# Patient Record
Sex: Female | Born: 1960 | Race: Black or African American | Hispanic: No | Marital: Married | Smoking: Never smoker
Health system: Southern US, Community
[De-identification: ages and names within clinical notes are randomized; demographics above are authoritative.]

## PROBLEM LIST (undated history)

## (undated) DIAGNOSIS — R1310 Dysphagia, unspecified: Secondary | ICD-10-CM

## (undated) DIAGNOSIS — Z9109 Other allergy status, other than to drugs and biological substances: Secondary | ICD-10-CM

## (undated) DIAGNOSIS — K219 Gastro-esophageal reflux disease without esophagitis: Secondary | ICD-10-CM

## (undated) DIAGNOSIS — R5383 Other fatigue: Secondary | ICD-10-CM

## (undated) DIAGNOSIS — D649 Anemia, unspecified: Secondary | ICD-10-CM

## (undated) DIAGNOSIS — M549 Dorsalgia, unspecified: Secondary | ICD-10-CM

## (undated) DIAGNOSIS — E739 Lactose intolerance, unspecified: Secondary | ICD-10-CM

## (undated) DIAGNOSIS — M1990 Unspecified osteoarthritis, unspecified site: Secondary | ICD-10-CM

## (undated) DIAGNOSIS — E119 Type 2 diabetes mellitus without complications: Secondary | ICD-10-CM

## (undated) DIAGNOSIS — F419 Anxiety disorder, unspecified: Secondary | ICD-10-CM

## (undated) DIAGNOSIS — Z91018 Allergy to other foods: Secondary | ICD-10-CM

## (undated) DIAGNOSIS — R7303 Prediabetes: Secondary | ICD-10-CM

## (undated) DIAGNOSIS — D126 Benign neoplasm of colon, unspecified: Secondary | ICD-10-CM

## (undated) DIAGNOSIS — M2550 Pain in unspecified joint: Secondary | ICD-10-CM

## (undated) DIAGNOSIS — R41840 Attention and concentration deficit: Secondary | ICD-10-CM

## (undated) DIAGNOSIS — R600 Localized edema: Secondary | ICD-10-CM

## (undated) DIAGNOSIS — E559 Vitamin D deficiency, unspecified: Secondary | ICD-10-CM

## (undated) DIAGNOSIS — R0602 Shortness of breath: Secondary | ICD-10-CM

## (undated) DIAGNOSIS — K589 Irritable bowel syndrome without diarrhea: Secondary | ICD-10-CM

## (undated) DIAGNOSIS — R0600 Dyspnea, unspecified: Secondary | ICD-10-CM

## (undated) DIAGNOSIS — G4730 Sleep apnea, unspecified: Secondary | ICD-10-CM

## (undated) DIAGNOSIS — G5603 Carpal tunnel syndrome, bilateral upper limbs: Secondary | ICD-10-CM

## (undated) HISTORY — DX: Attention and concentration deficit: R41.840

## (undated) HISTORY — DX: Anemia, unspecified: D64.9

## (undated) HISTORY — DX: Gastro-esophageal reflux disease without esophagitis: K21.9

## (undated) HISTORY — DX: Other fatigue: R53.83

## (undated) HISTORY — DX: Sleep apnea, unspecified: G47.30

## (undated) HISTORY — DX: Dysphagia, unspecified: R13.10

## (undated) HISTORY — DX: Dorsalgia, unspecified: M54.9

## (undated) HISTORY — DX: Anxiety disorder, unspecified: F41.9

## (undated) HISTORY — PX: WISDOM TOOTH EXTRACTION: SHX21

## (undated) HISTORY — DX: Unspecified osteoarthritis, unspecified site: M19.90

## (undated) HISTORY — DX: Localized edema: R60.0

## (undated) HISTORY — DX: Allergy to other foods: Z91.018

## (undated) HISTORY — PX: COLONOSCOPY: SHX174

## (undated) HISTORY — PX: NO PAST SURGERIES: SHX2092

## (undated) HISTORY — DX: Type 2 diabetes mellitus without complications: E11.9

## (undated) HISTORY — DX: Lactose intolerance, unspecified: E73.9

## (undated) HISTORY — DX: Other allergy status, other than to drugs and biological substances: Z91.09

## (undated) HISTORY — DX: Dyspnea, unspecified: R06.00

## (undated) HISTORY — DX: Pain in unspecified joint: M25.50

## (undated) HISTORY — DX: Vitamin D deficiency, unspecified: E55.9

## (undated) HISTORY — DX: Benign neoplasm of colon, unspecified: D12.6

## (undated) HISTORY — DX: Prediabetes: R73.03

## (undated) HISTORY — DX: Irritable bowel syndrome, unspecified: K58.9

## (undated) HISTORY — DX: Shortness of breath: R06.02

## (undated) HISTORY — DX: Carpal tunnel syndrome, bilateral upper limbs: G56.03

---

## 1999-02-02 ENCOUNTER — Other Ambulatory Visit: Admission: RE | Admit: 1999-02-02 | Discharge: 1999-02-02 | Payer: Self-pay | Admitting: Obstetrics & Gynecology

## 2001-03-29 ENCOUNTER — Emergency Department (HOSPITAL_COMMUNITY): Admission: EM | Admit: 2001-03-29 | Discharge: 2001-03-29 | Payer: Self-pay | Admitting: Emergency Medicine

## 2001-05-29 ENCOUNTER — Other Ambulatory Visit: Admission: RE | Admit: 2001-05-29 | Discharge: 2001-05-29 | Payer: Self-pay | Admitting: Obstetrics and Gynecology

## 2002-05-08 ENCOUNTER — Other Ambulatory Visit: Admission: RE | Admit: 2002-05-08 | Discharge: 2002-05-08 | Payer: Self-pay | Admitting: Obstetrics and Gynecology

## 2002-05-14 ENCOUNTER — Encounter: Admission: RE | Admit: 2002-05-14 | Discharge: 2002-05-14 | Payer: Self-pay | Admitting: Obstetrics and Gynecology

## 2002-05-14 ENCOUNTER — Encounter: Payer: Self-pay | Admitting: Obstetrics and Gynecology

## 2003-10-12 ENCOUNTER — Emergency Department (HOSPITAL_COMMUNITY): Admission: EM | Admit: 2003-10-12 | Discharge: 2003-10-12 | Payer: Self-pay | Admitting: Emergency Medicine

## 2004-05-06 ENCOUNTER — Encounter: Admission: RE | Admit: 2004-05-06 | Discharge: 2004-05-06 | Payer: Self-pay | Admitting: Obstetrics and Gynecology

## 2004-11-25 ENCOUNTER — Other Ambulatory Visit: Admission: RE | Admit: 2004-11-25 | Discharge: 2004-11-25 | Payer: Self-pay | Admitting: Obstetrics and Gynecology

## 2004-12-06 ENCOUNTER — Ambulatory Visit: Payer: Self-pay | Admitting: Internal Medicine

## 2004-12-07 ENCOUNTER — Ambulatory Visit: Payer: Self-pay | Admitting: Internal Medicine

## 2004-12-10 ENCOUNTER — Ambulatory Visit: Payer: Self-pay | Admitting: Pulmonary Disease

## 2005-02-02 ENCOUNTER — Ambulatory Visit: Payer: Self-pay | Admitting: Internal Medicine

## 2005-09-06 ENCOUNTER — Encounter: Admission: RE | Admit: 2005-09-06 | Discharge: 2005-09-06 | Payer: Self-pay | Admitting: Obstetrics and Gynecology

## 2005-11-09 ENCOUNTER — Ambulatory Visit: Payer: Self-pay | Admitting: Internal Medicine

## 2006-07-13 ENCOUNTER — Ambulatory Visit: Payer: Self-pay | Admitting: Pulmonary Disease

## 2006-07-28 ENCOUNTER — Ambulatory Visit: Payer: Self-pay | Admitting: Pulmonary Disease

## 2006-08-08 ENCOUNTER — Ambulatory Visit: Payer: Self-pay | Admitting: Pulmonary Disease

## 2006-08-08 ENCOUNTER — Ambulatory Visit (HOSPITAL_BASED_OUTPATIENT_CLINIC_OR_DEPARTMENT_OTHER): Admission: RE | Admit: 2006-08-08 | Discharge: 2006-08-08 | Payer: Self-pay | Admitting: Pulmonary Disease

## 2006-09-05 ENCOUNTER — Ambulatory Visit: Payer: Self-pay | Admitting: Internal Medicine

## 2006-09-24 LAB — HM MAMMOGRAPHY

## 2006-10-17 ENCOUNTER — Ambulatory Visit: Payer: Self-pay | Admitting: Internal Medicine

## 2006-11-13 ENCOUNTER — Encounter: Admission: RE | Admit: 2006-11-13 | Discharge: 2006-11-13 | Payer: Self-pay | Admitting: Obstetrics and Gynecology

## 2006-11-27 DIAGNOSIS — J309 Allergic rhinitis, unspecified: Secondary | ICD-10-CM | POA: Insufficient documentation

## 2006-12-19 ENCOUNTER — Ambulatory Visit: Payer: Self-pay | Admitting: Internal Medicine

## 2006-12-19 LAB — CONVERTED CEMR LAB
ALT: 11 units/L (ref 0–35)
Albumin: 3.7 g/dL (ref 3.5–5.2)
Basophils Absolute: 0 10*3/uL (ref 0.0–0.1)
Basophils Relative: 0.2 % (ref 0.0–1.0)
Bilirubin Urine: NEGATIVE
Bilirubin, Direct: 0.1 mg/dL (ref 0.0–0.3)
CO2: 30 meq/L (ref 19–32)
Calcium: 9.2 mg/dL (ref 8.4–10.5)
Creatinine, Ser: 0.9 mg/dL (ref 0.4–1.2)
Eosinophils Absolute: 0.1 10*3/uL (ref 0.0–0.6)
Eosinophils Relative: 1.3 % (ref 0.0–5.0)
GFR calc non Af Amer: 73 mL/min
Glucose, Urine, Semiquant: NEGATIVE
Hemoglobin: 12.9 g/dL (ref 12.0–15.0)
Ketones, urine, test strip: NEGATIVE
Lymphocytes Relative: 32 % (ref 12.0–46.0)
MCHC: 34.7 g/dL (ref 30.0–36.0)
Monocytes Absolute: 0.5 10*3/uL (ref 0.2–0.7)
Sodium: 138 meq/L (ref 135–145)
Total CHOL/HDL Ratio: 2.7
Urobilinogen, UA: 0.2
pH: 7

## 2006-12-26 ENCOUNTER — Ambulatory Visit: Payer: Self-pay | Admitting: Internal Medicine

## 2007-02-09 ENCOUNTER — Ambulatory Visit: Payer: Self-pay | Admitting: Internal Medicine

## 2007-07-24 ENCOUNTER — Ambulatory Visit: Payer: Self-pay | Admitting: Internal Medicine

## 2007-07-24 DIAGNOSIS — N760 Acute vaginitis: Secondary | ICD-10-CM | POA: Insufficient documentation

## 2007-07-24 DIAGNOSIS — R498 Other voice and resonance disorders: Secondary | ICD-10-CM | POA: Insufficient documentation

## 2007-07-27 LAB — CONVERTED CEMR LAB
Basophils Absolute: 0 10*3/uL (ref 0.0–0.1)
Basophils Relative: 0.4 % (ref 0.0–1.0)
Eosinophils Absolute: 0.1 10*3/uL (ref 0.0–0.7)
Eosinophils Relative: 0.8 % (ref 0.0–5.0)
GFR calc non Af Amer: 64 mL/min
HCT: 39.8 % (ref 36.0–46.0)
Hemoglobin: 13.1 g/dL (ref 12.0–15.0)
MCV: 88.2 fL (ref 78.0–100.0)
Monocytes Absolute: 0.6 10*3/uL (ref 0.1–1.0)
Platelets: 304 10*3/uL (ref 150–400)
RBC: 4.51 M/uL (ref 3.87–5.11)
RDW: 12.1 % (ref 11.5–14.6)
Sed Rate: 32 mm/hr — ABNORMAL HIGH (ref 0–22)
Sodium: 137 meq/L (ref 135–145)
WBC: 7.8 10*3/uL (ref 4.5–10.5)

## 2007-08-14 ENCOUNTER — Ambulatory Visit: Payer: Self-pay | Admitting: Internal Medicine

## 2007-08-14 DIAGNOSIS — M542 Cervicalgia: Secondary | ICD-10-CM | POA: Insufficient documentation

## 2007-11-14 ENCOUNTER — Encounter: Admission: RE | Admit: 2007-11-14 | Discharge: 2007-11-14 | Payer: Self-pay | Admitting: Obstetrics and Gynecology

## 2007-11-22 ENCOUNTER — Ambulatory Visit: Payer: Self-pay | Admitting: Internal Medicine

## 2007-11-22 DIAGNOSIS — E049 Nontoxic goiter, unspecified: Secondary | ICD-10-CM | POA: Insufficient documentation

## 2007-11-22 DIAGNOSIS — M549 Dorsalgia, unspecified: Secondary | ICD-10-CM | POA: Insufficient documentation

## 2007-11-26 LAB — CONVERTED CEMR LAB
Free T4: 0.8 ng/dL (ref 0.6–1.6)
TSH: 1.39 microintl units/mL (ref 0.35–5.50)

## 2007-11-28 ENCOUNTER — Encounter: Payer: Self-pay | Admitting: Internal Medicine

## 2008-01-14 ENCOUNTER — Encounter: Payer: Self-pay | Admitting: Internal Medicine

## 2008-01-30 ENCOUNTER — Encounter: Admission: RE | Admit: 2008-01-30 | Discharge: 2008-01-30 | Payer: Self-pay | Admitting: Orthopaedic Surgery

## 2008-05-16 ENCOUNTER — Telehealth: Payer: Self-pay | Admitting: *Deleted

## 2008-05-16 ENCOUNTER — Encounter: Payer: Self-pay | Admitting: Internal Medicine

## 2008-08-06 ENCOUNTER — Ambulatory Visit: Payer: Self-pay | Admitting: Internal Medicine

## 2009-04-25 DIAGNOSIS — R41840 Attention and concentration deficit: Secondary | ICD-10-CM

## 2009-04-25 HISTORY — DX: Attention and concentration deficit: R41.840

## 2010-01-22 ENCOUNTER — Telehealth: Payer: Self-pay | Admitting: *Deleted

## 2010-05-25 NOTE — Progress Notes (Signed)
Summary: cost of health assestment  Phone Note Call from Patient Call back at Work Phone [phone]   Caller: Patient Summary of Call: Pt wants to know the cost of a "Health Assesstment for employement for [employer]." Pt will also need a ppd done as well. She hasn't had a cpx done in awhile. Pt doesn't have any ins. [facility] and will bring the results with her.  So what would like to have done as far as labs and what level would you code it? Initial call taken by: Romualdo Bolk, CMA Duncan Dull),  January 22, 2010 8:46 AM  Follow-up for Phone Call        i dont know what this means   I dont know cost .  Depends on what  is needed.  ? is a form needed?  Follow-up by: Madelin Headings MD,  January 22, 2010 1:56 PM  Additional Follow-up for Phone Call Additional follow up Details #1::        Level 3-4 the cost is 125-178. Per Dr. Fabian Sharp she would charge either a level 3 or 4. We may do labs but she is unsure. If okay probably not. Additional Follow-up by: Romualdo Bolk, CMA Duncan Dull),  January 22, 2010 2:32 PM    Additional Follow-up for Phone Call Additional follow up Details #2::    Pt aware of cost. Follow-up by: Romualdo Bolk, CMA (AAMA),  January 25, 2010 8:31 AM

## 2010-09-10 NOTE — Assessment & Plan Note (Signed)
[facility]                             PULMONARY OFFICE NOTE   DUDLEY, COOLEY                       MRN:          [id number]  DATE:07/13/2006                            DOB:          Feb 02, 1961    I saw Ms. Rizzolo today for further evaluation of her sleep difficulties.   I had last seen her in August of 2006.  At that time, there was concern  that she had sleep maintenance insomnia, and I reviewed with her  techniques in relation to cognitive behavioral therapy.  I was also  quite concerned with her possibly having obstructive sleep apnea.  She  says that she was quite nervous about this diagnosis and as a result did  not follow through with undergoing a sleep study.  Her symptoms  apparently have persisted since then with regards to her sleep  difficulties in particular relation to her possible sleep apnea.  Her  husband says that she snores quite loudly and will see her stop  breathing while she is asleep.  She is currently going to bed at around  10 o'clock at night.  She falls asleep fairly quickly but then wakes up  several times during the night, sometimes with difficulty with her  breathing.  She will get up at 6:30 in the morning and still feel tired.   She also says that her father was diagnosed with asbestos lung disease,  and her mother may have asbestos lung disease as well.  He apparently  had contracted this while working in an aluminum plant.  Ms. 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  She does have occasional dyspnea on exertion but denies any symptoms of  coughing, wheezing, chest pain, or chest tightness.  She has not had any  problems with fevers, chills, sweats, or weight loss.   MEDICATIONS:  She is not currently on any medications.   ALLERGIES:  NO KNOWN DRUG ALLERGIES.   PHYSICAL EXAMINATION:  VITAL SIGNS:   She is 225 pounds.  Temperature  98.5, blood pressure 112/80, heart rate 59, oxygen saturation 98% on  room air.  HEENT:  There was no xanthelasmas.  No nasal discharge.  She has a  Mallampati III airway with an elongated and boggy uvula and 3+ tonsils.  There was no lymphadenopathy.  HEART:  S1 and S2.  CHEST:  No wheezing or rales.  ABDOMEN:  Obese, soft, nontender.  EXTREMITIES:  No edema.   IMPRESSION:  1. Sleep disruption with excess daytime sleepiness.  Again, the      concern that I have is that she likely has some degree of      underlying sleep disorder breathing.  She is agreeable at this time      to undergo an overnight polysomnogram to further evaluate this.  In      the meantime, I have discussed with her the importance of diet,      exercise, and weight reduction as  well as the avoidance of alcohol      and sedatives.  Driving precautions were discussed with her as      well.  2. Possible asbestos exposure.  To further evaluate this as well as to      establish a baseline, I will have her undergo pulmonary function      tests as well as a chest x-ray, and then depending upon if there      are any abnormalities on these, further interventions may be      necessary.   I will follow up with her in approximately four weeks.     Coralyn Helling, MD  Electronically Signed    VS/MedQ  DD: 07/13/2006  DT: 07/13/2006  Job #: [id number]   cc:   Neta Mends. Fabian Sharp, MD

## 2010-09-10 NOTE — Procedures (Signed)
NAME:  Briana Ferguson, Briana Ferguson NO.:  [id number]   MEDICAL RECORD NO.:  [id number]          PATIENT TYPE:  OUT   LOCATION:  SLEEP CENTER                 FACILITY:  [facility]   PHYSICIAN:  Coralyn Helling, MD        DATE OF BIRTH:  December 10, 1960   DATE OF STUDY:  08/08/2006                            NOCTURNAL POLYSOMNOGRAM   REFERRING PHYSICIAN:   FACILITY:  [facility].   INDICATIONS:  This is an individual who has symptoms of excessive  daytime sleepiness with snoring and witnessed apneas.  She is referred  to the sleep lab for evaluation of hypersomnia with obstructive sleep  apnea.   EPWORTH SCORE:  11.   MEDICATIONS:  The patient took a melatonin on the night of the study.   SLEEP ARCHITECTURE:  Total recording time was 467 minutes.  Total sleep  time was 386 minutes.  Sleep efficiency was 88%.  Sleep latency was 11  minutes.  REM latency was 57 minutes, which is slightly reduced.  The  study was notable for the lack of slow-wave sleep.  The patient slept in  both the supine and non-supine position.   RESPIRATORY DATA:  The average respiratory rate was 12.  The overall  apnea/hypopnea index was 5.  The events were exclusively obstructive in  nature.  Mild snoring was noted by the technician.  The supine  apnea/hypopnea index was 8.9.  The non-supine apnea/hypopnea index was  zero.  The REM apnea/hypopnea index was 7.3.  The non-REM apnea/hypopnea  index was 0.6.   OXYGEN DATA:  The baseline oxygenation was 97%.  The oxygen saturation  nadir was 89%.  The patient spent a total of 1.6 minutes with an oxygen  saturation between 81-90%.  The remainder of the test time was spent  with an oxygen saturation greater than 91%.   CARDIAC DATA:  The average heart rate was 52 and the rhythm strip showed  normal sinus rhythm with sinus bradycardia.   MOVEMENT PARASOMNIA:  The periodic limb movement index was 1.7.   IMPRESSION:  This study shows evidence for mild  obstructive sleep apnea  with an apnea/hypopnea index of 5 and an oxygen saturation nadir of 89%.  She did have a significant positional as well as REM effect to her sleep  apnea.  Consideration could be given to having her undergo positional  therapy in addition to diet, exercise, and weight  reduction.  If this unsuccessful, then further intervention may be  warranted such as continuous positive airway pressure therapy, oral  appliance or surgical intervention.      Coralyn Helling, MD  Diplomat, American Board of Sleep Medicine  Electronically Signed     VS/MEDQ  D:  08/22/2006 11:26:10  T:  08/22/2006 12:48:54  Job:  [id number]

## 2010-11-15 ENCOUNTER — Other Ambulatory Visit (INDEPENDENT_AMBULATORY_CARE_PROVIDER_SITE_OTHER): Payer: PRIVATE HEALTH INSURANCE

## 2010-11-15 DIAGNOSIS — Z0000 Encounter for general adult medical examination without abnormal findings: Secondary | ICD-10-CM

## 2010-11-15 LAB — HEPATIC FUNCTION PANEL
Alkaline Phosphatase: 78 U/L (ref 39–117)
Bilirubin, Direct: 0.1 mg/dL (ref 0.0–0.3)
Total Bilirubin: 0.4 mg/dL (ref 0.3–1.2)
Total Protein: 8.3 g/dL (ref 6.0–8.3)

## 2010-11-15 LAB — CBC WITH DIFFERENTIAL/PLATELET
Basophils Absolute: 0 10*3/uL (ref 0.0–0.1)
Eosinophils Absolute: 0 10*3/uL (ref 0.0–0.7)
HCT: 38.7 % (ref 36.0–46.0)
Hemoglobin: 13 g/dL (ref 12.0–15.0)
Lymphs Abs: 1.7 10*3/uL (ref 0.7–4.0)
MCV: 88 fl (ref 78.0–100.0)
Monocytes Relative: 10.9 % (ref 3.0–12.0)
Neutrophils Relative %: 53.2 % (ref 43.0–77.0)
Platelets: 267 10*3/uL (ref 150.0–400.0)
RBC: 4.4 Mil/uL (ref 3.87–5.11)
WBC: 4.8 10*3/uL (ref 4.5–10.5)

## 2010-11-15 LAB — POCT URINALYSIS DIPSTICK
Blood, UA: NEGATIVE
Ketones, UA: NEGATIVE
Leukocytes, UA: NEGATIVE
Nitrite, UA: NEGATIVE
Protein, UA: NEGATIVE
Urobilinogen, UA: 0.2
pH, UA: 8.5

## 2010-11-15 LAB — BASIC METABOLIC PANEL
BUN: 12 mg/dL (ref 6–23)
Creatinine, Ser: 0.9 mg/dL (ref 0.4–1.2)
GFR: 90.7 mL/min (ref >60.00)
Glucose, Bld: 102 mg/dL — ABNORMAL HIGH (ref 70–99)

## 2010-11-15 LAB — TSH: TSH: 1.46 u[IU]/mL (ref 0.35–5.50)

## 2010-11-15 LAB — LIPID PANEL: Total CHOL/HDL Ratio: 2

## 2010-11-17 ENCOUNTER — Encounter: Payer: Self-pay | Admitting: Internal Medicine

## 2010-11-22 ENCOUNTER — Ambulatory Visit (INDEPENDENT_AMBULATORY_CARE_PROVIDER_SITE_OTHER): Payer: PRIVATE HEALTH INSURANCE | Admitting: Internal Medicine

## 2010-11-22 ENCOUNTER — Other Ambulatory Visit (HOSPITAL_COMMUNITY)
Admission: RE | Admit: 2010-11-22 | Discharge: 2010-11-22 | Disposition: A | Discharge status: Home / Self Care | Payer: PRIVATE HEALTH INSURANCE | Source: Ambulatory Visit | Attending: Internal Medicine | Admitting: Internal Medicine

## 2010-11-22 ENCOUNTER — Encounter: Payer: Self-pay | Admitting: Internal Medicine

## 2010-11-22 VITALS — BP 120/80 | HR 60 | Ht 68.25 in | Wt 237.0 lb

## 2010-11-22 DIAGNOSIS — R0609 Other forms of dyspnea: Secondary | ICD-10-CM

## 2010-11-22 DIAGNOSIS — Z01419 Encounter for gynecological examination (general) (routine) without abnormal findings: Secondary | ICD-10-CM | POA: Insufficient documentation

## 2010-11-22 DIAGNOSIS — Z0000 Encounter for general adult medical examination without abnormal findings: Secondary | ICD-10-CM

## 2010-11-22 DIAGNOSIS — R0683 Snoring: Secondary | ICD-10-CM

## 2010-11-22 DIAGNOSIS — R0989 Other specified symptoms and signs involving the circulatory and respiratory systems: Secondary | ICD-10-CM

## 2010-11-22 DIAGNOSIS — G4733 Obstructive sleep apnea (adult) (pediatric): Secondary | ICD-10-CM | POA: Insufficient documentation

## 2010-11-22 NOTE — Progress Notes (Signed)
  Subjective:    Patient ID: Briana Ferguson, female    DOB: 08/01/1960, 50 y.o.   MRN: [id number]  HPI Patient comes in today for Preventive Health Care visit  No longer  Seeing psych .  Getting sleep  And   Not doing meds now.  Was in school.  Period  Is every other month about 5-6 days.   Husband had vasectomy Review of Systems Shoulder   Right  Numb spasm  Positional right arm numb at night .   Snoring sometime severe per husband but no choking remote hx of testing  Planning on trying some type  oral device bought OTC. ROS:  GEN/ HEENTNo fever, significant weight changes sweats headaches vision problems hearing changes, CV/ PULM; No chest pain shortness of breath cough, syncope,edema  change in exercise tolerance. GI /GU: No adominal pain, vomiting, change in bowel habits. No blood in the stool. No significant GU symptoms. SKIN/HEME: ,no acute skin rashes suspicious lesions or bleeding. No lymphadenopathy, nodules, masses.  NEURO/ PSYCH:  No neurologic signs such as weakness numbness No depression anxiety. IMM/ Allergy: No unusual infections.  Allergy .   REST of 12 system review negative  Except as above.   unisom and sleepy time tea.      Objective:   Physical Exam Physical Exam: Vital signs reviewed NWG:NFAO is a well-developed well-nourished alert cooperative  AA  female who appears her stated age in no acute distress.  HEENT: normocephalic  traumatic , Eyes: PERRL EOM's full, conjunctiva clear, Nares: paten,t no deformity discharge or tenderness., Ears: no deformity EAC's clear TMs with normal landmarks. Mouth: clear OP, no lesions, edema.  Moist mucous membranes. Dentition in adequate repair. NECK: supple without masses,  or bruits. thyroid palp no nodules  CHEST/PULM:  Clear to auscultation and percussion breath sounds equal no wheeze , rales or rhonchi. No chest wall deformities or tenderness. Breast: normal by inspection . No dimpling, discharge, masses, tenderness or discharge  .   CV: PMI is nondisplaced, S1 S2 no gallops, murmurs, rubs. Peripheral pulses are full without delay.No JVD .  ABDOMEN: Bowel sounds normal nontender  No guard or rebound, no hepato splenomegal no CVA tenderness.  No hernia. Extremtities:  No clubbing cyanosis or edema, no acute joint swelling or redness no focal atrophy NEURO:  Oriented x3, cranial nerves 3-12 appear to be intact, no obvious focal weakness,gait within normal limits no abnormal reflexes or asymmetrical SKIN: No acute rashes normal turgor, color, no bruising or petechiae. PSYCH: Oriented, good eye contact, no obvious depression anxiety, cognition and judgment appear normal. LN: no cervical axillary inguinal adenopathy Pelvic: NL ext GU, labia clear without lesions or rash . Vagina no lesions .Cervix: clear  UTERUS: Neg CMT Adnexa:  clear no masses . PAP done REctal no masses stool hem neg  EKG:  NSR  Labs reviewed with patient.     Assessment & Plan:  Preventive Health Care Counseled regarding healthy nutrition, exercise, sleep, injury prevention, calcium vit d and healthy weight .   Snoring   Hx of  Neg sleep study a few years ago but husband concerned about her snoring and breathing at night.    rec opinion from ent  For poss upper airway resistance problem  attentional issues better now out of tchool ? Med dint help anyway .  On no meds now.

## 2010-11-22 NOTE — Patient Instructions (Signed)
Continue lifestyle intervention healthy eating and exercise . Weight loss may help snoring. Will contact you about ENT consult  In regard to snoring. Will contact about pap results .

## 2010-11-24 LAB — TB SKIN TEST

## 2010-11-25 ENCOUNTER — Encounter: Payer: Self-pay | Admitting: *Deleted

## 2011-02-19 ENCOUNTER — Inpatient Hospital Stay (INDEPENDENT_AMBULATORY_CARE_PROVIDER_SITE_OTHER)
Admission: RE | Admit: 2011-02-19 | Discharge: 2011-02-19 | Disposition: A | Discharge status: Home / Self Care | Payer: PRIVATE HEALTH INSURANCE | Source: Ambulatory Visit | Attending: Emergency Medicine | Admitting: Emergency Medicine

## 2011-02-19 DIAGNOSIS — H81399 Other peripheral vertigo, unspecified ear: Secondary | ICD-10-CM

## 2011-03-16 ENCOUNTER — Ambulatory Visit (INDEPENDENT_AMBULATORY_CARE_PROVIDER_SITE_OTHER): Payer: PRIVATE HEALTH INSURANCE | Admitting: Internal Medicine

## 2011-03-16 ENCOUNTER — Encounter: Payer: Self-pay | Admitting: Internal Medicine

## 2011-03-16 VITALS — BP 120/80 | HR 60 | Wt 239.0 lb

## 2011-03-16 DIAGNOSIS — J309 Allergic rhinitis, unspecified: Secondary | ICD-10-CM

## 2011-03-16 DIAGNOSIS — R209 Unspecified disturbances of skin sensation: Secondary | ICD-10-CM

## 2011-03-16 DIAGNOSIS — R200 Anesthesia of skin: Secondary | ICD-10-CM

## 2011-03-16 DIAGNOSIS — R42 Dizziness and giddiness: Secondary | ICD-10-CM

## 2011-03-16 DIAGNOSIS — Z9181 History of falling: Secondary | ICD-10-CM

## 2011-03-16 MED ORDER — FLUTICASONE PROPIONATE 50 MCG/ACT NA SUSP: 2.0000 | Freq: Every day | NASAL | Status: DC

## 2011-03-16 NOTE — Progress Notes (Signed)
  Subjective:    Patient ID: Briana Ferguson, female    DOB: 1960-05-12, 50 y.o.   MRN: [id number]  HPI  Patient comes in today for SDA  For multiple problem evaluation. Apparently sen in ed OCT 27 fr dizziness felt to be   From peripheral vertigo  And was congested at the time. Called it lightheadedness  But aggravated. But had to hold onto something. Feel like going backward. Felt like stumbling  And no vision change   Months ago  Larey Seat  down steps .  And caught self  And then later  [facility] on knee .  ? From light headedness. .   And then normal . NO vision change Took antivert night an dramamine  No recent nose spray. Gets congested   Also arm numbness still coming  Often night related but now gets off and on both arms from neck  Down to hands no weakness or fasciculations ? If has occ numbness in leg.   Review of Systems No fever ha vision loss bowel bladder changes   No head trauma or memory changes  Past Medical History  Diagnosis Date  . Allergy   . Attention and concentration deficit     had med trial Dr Tomasa Rand   No past surgical history on file.  reports that she has never smoked. She does not have any smokeless tobacco history on file. She reports that she does not drink alcohol. Her drug history not on file. family history includes Cancer in an unspecified family member; Diabetes in her father; Hyperlipidemia in an unspecified family member; and Stroke in her father. Allergies  Allergen Reactions  . Sulfamethoxazole W/Trimethoprim     REACTION: feels faint       Objective:   Physical Exam WDWN in nad looks well.  HEENT: Normocephalic ;atraumatic , Eyes;  PERRL, EOMs  Full, lids and conjunctiva clear,,Ears: no deformities, canals nl, TM landmarks normal, Nose: no deformity or discharge congested  Mouth : OP clear without lesion or edema . Tongue midline   Chest:  Clear to A&P without wheezes rales or rhonchi CV:  S1-S2 no gallops or murmurs peripheral perfusion is  normal NEURO: oriented x 3 CN 3-12 appear intact. No focal muscle weakness or atrophy. DTRs symmetrical. Gait WNL.  Grossly non focal. No tremor or abnormal movement.  Neg rhomberg but feels unsteady.  Wearing 2 " heels reviewed ED record       Assessment & Plan:   Dizziness presumed vertigo but a bit atypical  Hx of 2 falls  Although not serious injury. Allergic vs chronic rhinitis    Nose some    better recently  Try zyrtec or claritin  In the meantime Numbness mostly UE  Poss from neck  But bilateral at times  No finding obv  Finding on exam today   Because of both orf above hx  Will get neuro consult for opinion    Flu shot today

## 2011-03-16 NOTE — Patient Instructions (Signed)
Will contact  You about neurology consult. For the numbness   Hx and dizziness . Vertigo could  be from  Congestion.  But  I agree we should get a consultation because of the falling episode . In the meantime take the nasal cortisone and antihistamine in case allergy  Congestion is causing some of the symptoms.

## 2011-03-21 ENCOUNTER — Encounter: Payer: Self-pay | Admitting: Neurology

## 2011-04-12 ENCOUNTER — Ambulatory Visit (INDEPENDENT_AMBULATORY_CARE_PROVIDER_SITE_OTHER): Payer: PRIVATE HEALTH INSURANCE | Admitting: Neurology

## 2011-04-12 ENCOUNTER — Encounter: Payer: Self-pay | Admitting: Neurology

## 2011-04-12 VITALS — BP 128/80 | HR 84 | Ht 68.0 in | Wt 241.0 lb

## 2011-04-12 DIAGNOSIS — R200 Anesthesia of skin: Secondary | ICD-10-CM

## 2011-04-12 DIAGNOSIS — R209 Unspecified disturbances of skin sensation: Secondary | ICD-10-CM

## 2011-04-12 NOTE — Progress Notes (Signed)
Dear Dr. Fabian Sharp,  Thank you for having me see Briana Ferguson in consultation today at [facility] for her problem with bilateral arm, hand pain.  As you may recall, she is a 50 y.o. year old female with a benign medical history who presents with complaints of bilateral arm pain. It started 2 years ago.  Started in right hand, then moved to left hand.  Hands felt num, worse at night then the day.  Shaking them out made them better. Months after it started, she felt that if she lifted the arm up made it better.  Also, has right sided neck pain, and if stretched the neck out, then felt release of pain.  Both hands equally involved, middle fingers stung the most.  Not dropping things with hands.  Grip is not as strong.  Now goes up the arm to shoulder, described as spasming, electric shocks.  Relieved if she sleeps on her back.  Used ibuprofen, helped somewhat.   Past Medical History  Diagnosis Date  . Allergy   . Attention and concentration deficit     had med trial Dr Tomasa Rand    No past surgical history on file.  History   Social History  . Marital Status: Married    Spouse Name: N/A    Number of Children: N/A  . Years of Education: N/A   Social History Main Topics  . Smoking status: Never Smoker   . Smokeless tobacco: Never Used  . Alcohol Use: No  . Drug Use: None  . Sexually Active: None   Other Topics Concern  . None   Social History Narrative   [employer] degree  May 2011. 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   4 Pet dog   Has smoke detector and wears seat belts.  No firearms. Stored safely .Sees dentist regularly . No depression CB x 3 Husband with vascectomy    Family History  Problem Relation Age of Onset  . Diabetes Father   . Stroke Father   . Hyperlipidemia      fhx  . Cancer      granfather/liver    Current Outpatient Prescriptions on File Prior to Visit  Medication Sig Dispense Refill  . fluticasone (FLONASE)  50 MCG/ACT nasal spray Place 2 sprays into the nose daily.  16 g  3    Allergies  Allergen Reactions  . Sulfamethoxazole W/Trimethoprim     REACTION: feels faint      ROS:  13 systems were reviewed and are notable for leg pain while walking, and difficulty with balance.  All other review of systems are unremarkable.   Examination:  Filed Vitals:   04/12/11 1429  BP: 128/80  Pulse: 84  Height: 5\' 8"  (1.727 m)  Weight: 241 lb (109.317 kg)     In general, well appearing women.  Extremity: + tinel's sign bilateral ulnar nerves.  ?+Phalen's sign bilaterally.  No tinel's sign at the carpal tunnel.  Cardiovascular: The patient has a regular rate and rhythm and no carotid bruits.  Fundoscopy:  Disks are flat. Vessel caliber within normal limits.  Mental status:   The patient is oriented to person, place and time. Recent and remote memory are intact. Attention span and concentration are normal. Language including repetition, naming, following commands are intact. Fund of knowledge of current and historical events, as well as vocabulary are normal.  Cranial Nerves: Pupils are equally round and reactive to  light. Visual fields full to confrontation. Extraocular movements are intact without nystagmus. Facial sensation and muscles of mastication are intact. Muscles of facial expression are symmetric. Hearing intact to bilateral finger rub. Tongue protrusion, uvula, palate midline.  Shoulder shrug intact  Motor:  The patient has normal bulk and tone, no pronator drift.  There are no adventitious movements. 5/5 bilaterally, normal strength intrinsic muscles of the hand.  Reflexes:   Biceps  Triceps Brachioradialis Knee Ankle  Right 2+  2+  2+   2+ 2+  Left  2+  2+  2+   2+ 2+  Toes down  Coordination:  Normal finger to nose.  No dysdiadokinesia.  Sensation is may be somewhat decreased to pin in the distribution of the left median nerve.  However, inconsistent.  Gait and  Station are normal.  Tandem gait is intact.  Romberg is negative   Impression/Recs: Bilateral ulnar neuropathy vs. median neuropathy vs. radiculopathy.  I am going to get an EMG/NCS of her bilateral UE.  I have asked her to try CTS splinting with the presumptive diagnosis of CTS despite some contradictory findings.  I will call her when we have the results of her NCS/EMG.  We will see the patient back in 6 weeks.  Thank you for having [place] see Briana Ferguson in consultation.  Feel free to contact me with any questions.  Lupita Raider Modesto Charon, MD [facility], [place] [address] [place], [place] [address] Phone: [phone] Fax: [phone].

## 2011-04-12 NOTE — Patient Instructions (Signed)
Your appointment for the nerve conduction studies/electromyelogram is scheduled for Monday, January 14th at 9:45am. at [facility] [address] [place], [place] [phone].

## 2011-05-18 NOTE — Progress Notes (Signed)
EMG/NCS revealed bilateral moderate to severe CTS without signs of radiculopathy or ulnar neuropathy.

## 2011-05-19 ENCOUNTER — Telehealth: Payer: Self-pay | Admitting: Neurology

## 2011-05-19 NOTE — Telephone Encounter (Signed)
Message copied by Benay Spice on Thu May 19, 2011 10:02 AM ------      Message from: Denton Meek H      Created: Wed May 18, 2011 10:59 PM      Regarding: NCS results       Hi Jan,            Could you let [name] know that her EMG did confirm bilateral carpal tunnel syndrome.  If you could find out how her splints are working that would be great.  If she wants we can refer her to Dr. Amanda Pea at [facility] to consult him for carpal tunnel release or steroid injections.            Of course if she has any questions that you can't answer I am happy to call her.            Matt

## 2011-05-19 NOTE — Telephone Encounter (Signed)
Left a message for the patient to call our office (both the home and mobile numbers).

## 2011-05-20 NOTE — Telephone Encounter (Signed)
Left a message for the patient to call.

## 2011-05-23 NOTE — Telephone Encounter (Signed)
Left a message for the patient to call.

## 2011-05-23 NOTE — Telephone Encounter (Signed)
Spoke with the patient. Info given as per Dr. Modesto Charon below. The patient reports that she is having good relief from pain/discomfort with the splints. She does not want to be referred to ortho at this time. I advised the patient to call us for any worsening of her symptoms or if she wanted [place] to make a referral. The patient agreed with this plan. **Dr. Modesto Charon, [place] Childes...........Marland Kitchen

## 2011-05-24 ENCOUNTER — Encounter: Payer: Self-pay | Admitting: Neurology

## 2011-05-25 ENCOUNTER — Ambulatory Visit: Payer: PRIVATE HEALTH INSURANCE | Admitting: Neurology

## 2011-12-21 ENCOUNTER — Other Ambulatory Visit (INDEPENDENT_AMBULATORY_CARE_PROVIDER_SITE_OTHER): Payer: PRIVATE HEALTH INSURANCE

## 2011-12-21 DIAGNOSIS — Z0000 Encounter for general adult medical examination without abnormal findings: Secondary | ICD-10-CM

## 2011-12-21 LAB — BASIC METABOLIC PANEL
BUN: 13 mg/dL (ref 6–23)
CO2: 25 mEq/L (ref 19–32)
Calcium: 8.6 mg/dL (ref 8.4–10.5)
GFR: 86.78 mL/min (ref >60.00)
Potassium: 3.9 mEq/L (ref 3.5–5.1)
Sodium: 136 mEq/L (ref 135–145)

## 2011-12-21 LAB — CBC WITH DIFFERENTIAL/PLATELET
Basophils Absolute: 0 10*3/uL (ref 0.0–0.1)
HCT: 37.7 % (ref 36.0–46.0)
Hemoglobin: 12.3 g/dL (ref 12.0–15.0)
Lymphs Abs: 1.4 10*3/uL (ref 0.7–4.0)
MCHC: 32.5 g/dL (ref 30.0–36.0)
MCV: 88.5 fl (ref 78.0–100.0)
Neutrophils Relative %: 55.3 % (ref 43.0–77.0)
Platelets: 293 10*3/uL (ref 150.0–400.0)

## 2011-12-21 LAB — POCT URINALYSIS DIPSTICK
Bilirubin, UA: NEGATIVE
Ketones, UA: NEGATIVE
Leukocytes, UA: NEGATIVE
Urobilinogen, UA: 0.2
pH, UA: 5.5

## 2011-12-21 LAB — LIPID PANEL
Cholesterol: 145 mg/dL (ref 0–200)
VLDL: 11.4 mg/dL (ref 0.0–40.0)

## 2011-12-21 LAB — HEPATIC FUNCTION PANEL
AST: 15 U/L (ref 0–37)
Alkaline Phosphatase: 66 U/L (ref 39–117)
Total Bilirubin: 0.8 mg/dL (ref 0.3–1.2)

## 2011-12-22 ENCOUNTER — Other Ambulatory Visit: Payer: Self-pay | Admitting: Internal Medicine

## 2011-12-28 ENCOUNTER — Ambulatory Visit (INDEPENDENT_AMBULATORY_CARE_PROVIDER_SITE_OTHER): Payer: PRIVATE HEALTH INSURANCE | Admitting: Internal Medicine

## 2011-12-28 ENCOUNTER — Encounter: Payer: Self-pay | Admitting: Internal Medicine

## 2011-12-28 VITALS — BP 100/70 | HR 94 | Temp 99.0°F | Ht 68.0 in | Wt 241.0 lb

## 2011-12-28 DIAGNOSIS — Z6836 Body mass index (BMI) 36.0-36.9, adult: Secondary | ICD-10-CM

## 2011-12-28 DIAGNOSIS — Z6838 Body mass index (BMI) 38.0-38.9, adult: Secondary | ICD-10-CM | POA: Insufficient documentation

## 2011-12-28 DIAGNOSIS — J309 Allergic rhinitis, unspecified: Secondary | ICD-10-CM

## 2011-12-28 DIAGNOSIS — Z23 Encounter for immunization: Secondary | ICD-10-CM

## 2011-12-28 DIAGNOSIS — Z6837 Body mass index (BMI) 37.0-37.9, adult: Secondary | ICD-10-CM | POA: Insufficient documentation

## 2011-12-28 DIAGNOSIS — G5600 Carpal tunnel syndrome, unspecified upper limb: Secondary | ICD-10-CM | POA: Insufficient documentation

## 2011-12-28 DIAGNOSIS — Z0000 Encounter for general adult medical examination without abnormal findings: Secondary | ICD-10-CM

## 2011-12-28 MED ORDER — FLUTICASONE PROPIONATE 50 MCG/ACT NA SUSP: NASAL | Status: DC

## 2011-12-28 NOTE — Progress Notes (Signed)
Subjective:    Patient ID: Briana Ferguson, female    DOB: Nov 12, 1960, 51 y.o.   MRN: [id number]  HPI  Patient comes in today for Preventive Health Care visit  No major change in health status since last visit . Had work up for CTS mod sever but getting better and declined surgery. Seems ok. Better   When on back no neck pain.  Sleep is better on no meds   Allergic rhinitis   Needs refill flonase.  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 Review of Systems ROS:  GEN/ HEENT: No fever, significant weight changes sweats headaches vision problems hearing changes, CV/ PULM; No chest pain shortness of breath cough, syncope,edema  change in exercise tolerance. GI /GU: No adominal pain, vomiting, change in bowel habits. No blood in the stool. No significant GU menses now irreg  Skipping otherwise normal  symptoms. sometim pulling breast area  Right upper no masses  SKIN/HEME: ,no acute skin rashes suspicious lesions or bleeding. No lymphadenopathy, nodules, masses.  NEURO/ PSYCH:  No neurologic signs such as weakness numbness. No depression anxiety. IMM/ Allergy: No unusual infections.  Allergy .   REST of 12 system review negative except as per HPI  Outpatient Encounter Prescriptions as of 12/28/2011  Medication Sig Dispense Refill  . fluticasone (FLONASE) 50 MCG/ACT nasal spray USE TWO SPRAY IN EACH NOSTRIL EVERY DAY  16 g  0  Past history family history social history reviewed in the electronic medical record.      Objective:   Physical Exam BP 100/70  Pulse 94  Temp 99 F (37.2 C) (Oral)  Ht 5\' 8"  (1.727 m)  Wt 241 lb (109.317 kg)  BMI 36.64 kg/m2  SpO2 98%  Wt Readings from Last 3 Encounters:  12/28/11 241 lb (109.317 kg)  04/12/11 241 lb (109.317 kg)  03/16/11 239 lb (108.41 kg)   Physical Exam: Vital signs reviewed WUJ:WJXB is a well-developed well-nourished alert cooperative  female who appears her stated age in no acute distress.  HEENT: normocephalic  atraumatic , Eyes: PERRL EOM's full, conjunctiva clear, Nares: paten,t no deformity discharge or tenderness., Ears: no deformity EAC's clear TMs with normal landmarks. Mouth: clear OP, no lesions, edema.  Moist mucous membranes. Dentition in adequate repair. NECK: supple without masses, thyromegaly or bruits. CHEST/PULM:  Clear to auscultation and percussion breath sounds equal no wheeze , rales or rhonchi. No chest wall deformities or tenderness. CV: PMI is nondisplaced, S1 S2 no gallops, murmurs, rubs. Peripheral pulses are full without delay.No JVD .  Breast: normal by inspection . No dimpling, discharge, masses, tenderness or discharge . Pendulous breasts  ABDOMEN: Bowel sounds normal nontender  No guard or rebound, no hepato splenomegal no CVA tenderness.  No hernia. Extremtities:  No clubbing cyanosis or edema, no acute joint swelling or redness no focal atrophy NEURO:  Oriented x3, cranial nerves 3-12 appear to be intact, no obvious focal weakness,gait within normal limits no abnormal reflexes or asymmetrical SKIN: No acute rashes normal turgor, color, no bruising or petechiae. PSYCH: Oriented, good eye contact, no obvious depression anxiety, cognition and judgment appear normal. LN: no cervical axillary inguinal adenopathy   Lab Results  Component Value Date   WBC 4.3* 12/21/2011   HGB 12.3 12/21/2011   HCT 37.7 12/21/2011   PLT 293.0 12/21/2011   GLUCOSE 91 12/21/2011   CHOL 145 12/21/2011   TRIG 57.0 12/21/2011   HDL 66.20 12/21/2011   LDLCALC 67 12/21/2011  ALT 13 12/21/2011   AST 15 12/21/2011   NA 136 12/21/2011   K 3.9 12/21/2011   CL 104 12/21/2011   CREATININE 0.9 12/21/2011   BUN 13 12/21/2011   CO2 25 12/21/2011   TSH 1.31 12/21/2011  urine was on cycle at time of ua    Assessment & Plan:  Preventive Health Care Counseled regarding healthy nutrition, exercise, sleep, injury prevention, calcium vit d and healthy weight . Flu vaccine  Perimenopausal with irreg but otherwise nl  periods.  Allergic  Rh refill floonase  CTS  Counseled.

## 2011-12-28 NOTE — Patient Instructions (Addendum)
Optimize work stations to avoid flare of the carpal tunnel syndrome.  Healthy weight loss will help your overall  Health in the future. But your labs are good today. PAPs if normal and no high risk hx can be done every 3 years.  Every 5 years if HPV negative and not having a problem.  Get a mammogram .  continue good sleep hygiene.

## 2012-06-06 ENCOUNTER — Encounter: Payer: Self-pay | Admitting: Internal Medicine

## 2012-06-06 ENCOUNTER — Ambulatory Visit (INDEPENDENT_AMBULATORY_CARE_PROVIDER_SITE_OTHER): Payer: BC Managed Care – PPO | Admitting: Internal Medicine

## 2012-06-06 VITALS — BP 132/80 | HR 85 | Temp 98.3°F | Resp 20 | Wt 247.0 lb

## 2012-06-06 DIAGNOSIS — H6121 Impacted cerumen, right ear: Secondary | ICD-10-CM

## 2012-06-06 DIAGNOSIS — R413 Other amnesia: Secondary | ICD-10-CM

## 2012-06-06 DIAGNOSIS — H6120 Impacted cerumen, unspecified ear: Secondary | ICD-10-CM

## 2012-06-06 NOTE — Progress Notes (Signed)
Chief Complaint  Patient presents with  . Headache  . Memory Loss    HPI: Patient comes in today for SDA for  problem evaluation. Spent.  Here with husband.  Is having continued problems with what she calls short-term memory loss and difficulties  Husband frustrated at times.   Feels" like brain is moving " When lays down. Sensation hearing is okay but sometimes decrease on the right had a history of earwax that we flushed out in the past and she felt better. A few blood vessel burst in high a couple times without any vision change but her blood pressure has been okay when she checked it.  No new medications numbness weakness although she has a history of carpal tunnel. Her memory difficulties she describes as forgetting what she was going to do her conversations that she just had. However mind is quite busy. She has occasional headaches.  No specific events that cause these problems and no balance issues. She is right-handed.  Ocass HAs.  Courting to her husband she's always had short term memory issues about 4/10 but over the last 6 months perhaps 6/10 worsening.  Denies calculation problems or disorientation   Had some problems in school in the early grades with poor grades. At some point she had gone to a neurologist who thought she might have had ADD and was given a stimulant medicine with some questionable affect. Don't believe she ever had full psych for evaluation testing.  ROS: See pertinent positives and negatives per HPI. Hx CTS and had evaluation about a year ago.  Sleep:Unisom.  ocass for sleep and alelrgy with benadryl but not on a reg basis.   Past Medical History  Diagnosis Date  . Allergy   . Attention and concentration deficit     had med trial Dr Tomasa Rand ? dx     Family History  Problem Relation Age of Onset  . Diabetes Father   . Stroke Father   . Hyperlipidemia      fhx  . Cancer      granfather/liver    History   Social History  . Marital Status:  Married    Spouse Name: N/A    Number of Children: N/A  . Years of Education: N/A   Social History Main Topics  . Smoking status: Never Smoker   . Smokeless tobacco: Never Used  . Alcohol Use: No  . Drug Use: None  . Sexually Active: None   Other Topics Concern  . None   Social History Narrative   [employer] degree  May 2011.  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    Married    HH   Of.   6   Pet dog  Outside pet.      Has smoke detector and wears seat belts.  No firearms. Stored safely .Sees dentist regularly . No depression       CB x 3    Husband with vascectomy             Outpatient Encounter Prescriptions as of 06/06/2012  Medication Sig Dispense Refill  . fluticasone (FLONASE) 50 MCG/ACT nasal spray 2 sprays each nostril each day  16 g  12   No facility-administered encounter medications on file as of 06/06/2012.    EXAM:  BP 132/80  Pulse 85  Temp(Src) 98.3 F (36.8 C) (Oral)  Resp 20  Wt 247 lb (112.038 kg)  BMI 37.56 kg/m2  SpO2 97%  Body mass index  is 37.56 kg/(m^2).  GENERAL: vitals reviewed and listed above, alert, oriented, appears well hydrated and in no acute distress  HEENT: atraumatic, conjunctiva  clear, no obvious abnormalities on inspection of external nose and ears right eac with wax tm nl left  OP : no lesion edema or exudate  tongue midline   NECK: no obvious masses on inspection palpation  nobruits   LUNGS: clear to auscultation bilaterally, no wheezes, rales or rhonchi, good air movement  CV: HRRR, no clubbing cyanosis or  peripheral edema nl cap refill  Abdomen:  Sof,t normal bowel sounds without hepatosplenomegaly, no guarding rebound or masses no CVA tenderness NEURO: oriented x 3 CN 3-12 appear intact. No focal muscle weakness or atrophy. DTRs symmetrical. Gait WNL.  Grossly non focal. No tremor or abnormal movement. Heel to to nl neg rhomberg  Nl.  speech and converstaion. MS: moves all extremities without noticeable focal   abnormality  PSYCH: pleasant and cooperative, no obvious depression or anxiety Lab Results  Component Value Date   WBC 4.3* 12/21/2011   HGB 12.3 12/21/2011   HCT 37.7 12/21/2011   PLT 293.0 12/21/2011   GLUCOSE 91 12/21/2011   CHOL 145 12/21/2011   TRIG 57.0 12/21/2011   HDL 66.20 12/21/2011   LDLCALC 67 12/21/2011   ALT 13 12/21/2011   AST 15 12/21/2011   NA 136 12/21/2011   K 3.9 12/21/2011   CL 104 12/21/2011   CREATININE 0.9 12/21/2011   BUN 13 12/21/2011   CO2 25 12/21/2011   TSH 1.31 12/21/2011    ASSESSMENT AND PLAN:  Discussed the following assessment and plan:  1. Memory difficulties   2. Excess wax in ear, right    ongoing problem worsening recently. It is certainly possible she could have ADHD and LD or other process. No focal findings on her exam today. Discussed referral to a neurologist to consider getting an MRI of the brain consider psychoeducational valuation testing to try to figure out how to help. Suggest husband calm to the visit when she sees the neurologist.  In regard to the funny feeling in her head when she lays down it certainly could be the wax in her a year or other will have her come back for this as we are in the midst of the big snowstorm. And office is closing.  -Patient advised to return or notify health care team  if symptoms worsen or persist or new concerns arise.  Patient Instructions  Fortunately no alarming symptoms on your exam today. You do have wax in her right ear which could be adding to some of your head symptoms. Use some earwax softening drops in the right ear and if you're ear symptoms continue over the next week or to come back in for an appointment and we will try to flush out the wax.   Someone will contact you about a neurology consult as we discussed. Contact us in the short run if you're having significant worsening of symptoms or concerns.        Neta Mends. Brayant Dorr M.D.

## 2012-06-06 NOTE — Patient Instructions (Signed)
Fortunately no alarming symptoms on your exam today. You do have wax in her right ear which could be adding to some of your head symptoms. Use some earwax softening drops in the right ear and if you're ear symptoms continue over the next week or to come back in for an appointment and we will try to flush out the wax.   Someone will contact you about a neurology consult as we discussed. Contact us in the short run if you're having significant worsening of symptoms or concerns.

## 2012-07-11 ENCOUNTER — Encounter: Payer: Self-pay | Admitting: Neurology

## 2012-07-11 ENCOUNTER — Ambulatory Visit (INDEPENDENT_AMBULATORY_CARE_PROVIDER_SITE_OTHER): Payer: BC Managed Care – PPO | Admitting: Neurology

## 2012-07-11 VITALS — BP 116/74 | HR 80 | Temp 98.4°F | Resp 16 | Ht 68.0 in | Wt 248.0 lb

## 2012-07-11 DIAGNOSIS — G4730 Sleep apnea, unspecified: Secondary | ICD-10-CM

## 2012-07-11 DIAGNOSIS — R0989 Other specified symptoms and signs involving the circulatory and respiratory systems: Secondary | ICD-10-CM

## 2012-07-11 DIAGNOSIS — G4733 Obstructive sleep apnea (adult) (pediatric): Secondary | ICD-10-CM

## 2012-07-11 DIAGNOSIS — R0683 Snoring: Secondary | ICD-10-CM

## 2012-07-11 DIAGNOSIS — R0609 Other forms of dyspnea: Secondary | ICD-10-CM

## 2012-07-11 NOTE — Progress Notes (Signed)
Briana Ferguson he is a 52 year old woman who is perimenopausal and she has a history of sleep difficulties.  She was tested for sleep apnea about 5 years ago, but at that time her test was apparently negative for obstructive sleep apnea.  Her husband mentions that she does snore and stop breathing at night and it is worse now than it was 5 years ago.  There is also some correlation with weight. At one point she had lost some weight and she states that she was breathing much better.  Both of her parents had sleep apnea.  Her father died of a heart attack. Her mother lost some weight and now she is trying to get off of the CPAP machine.   She typically goes to bed around 10 and wakes up at 445 Sundays at 7:00 other days. 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  She has trouble staying asleep. She commonly wakes up during the night especially between one and 3 but sometimes every hour.  She is asleep for 2 days in a row she'll take Benadryl or Seroquel and it does seem to help.  She prefers not to take Ambien as her father to get and he may have had some side effects.  If she goes a long weekend, she sleeps much better when she is not thinking about the office.  She feels refreshed towards the end of the weekend.  When she is at home, she is up and playing on eye pad and falls asleep in front of the couch, and she gets back up to the bedroom she is wide awake and has trouble falling asleep.  She has noticed for the past few years that she doesn't have as good and memory for details of conversation but she had a few minutes ago or what happened a few days ago.  For example, she remembers being in church on Sunday and what the topic of the sternum was, but she can't remember as much of the details she feels like she should.  Her husband has noticed that she forgets to do some things more so in the past 3-5 years and she had in the past.   Probably her plate is more full as well.  Review of symptoms is positive for pain in the back, knee pain, numbness of the hands and arms at night when she lays on her stomach but can be painful, and she does have a diagnosis of bilateral carpal tunnel syndrome confirmed under conduction studies. She has trouble with weight loss. She feels tired and her body can feel achy.  Past Medical History  Diagnosis Date  . Allergy   . Attention and concentration deficit 2011    had med trial Dr Tomasa Rand ? dx     Current Outpatient Prescriptions on File Prior to Visit  Medication Sig Dispense Refill  . fluticasone (FLONASE) 50 MCG/ACT nasal spray 2 sprays each nostril each day  16 g  12   No current facility-administered medications on file prior to visit.   Sulfamethoxazole w-trimethoprim  Allergies  History   Social History  . Marital Status: Married    Spouse Name: N/A    Number of Children: N/A  . Years of Education: N/A   Occupational History  . Not on file.   Social History Main Topics  . Smoking status: Never Smoker   . Smokeless tobacco: Never Used  .  Alcohol Use: No     Comment: none  . Drug Use: No  . Sexually Active: Not on file   Other Topics Concern  . 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    Married    HH   Of.   6   Pet dog  Outside pet.      Has smoke detector and wears seat belts.  No firearms. Stored safely .Sees dentist regularly . No depression       CB x 3    Husband with vascectomy             Family History  Problem Relation Age of Onset  . Diabetes Father   . Stroke Father   . Hyperlipidemia      fhx  . Cancer      granfather/liver    BP 116/74  Pulse 80  Temp(Src) 98.4 F (36.9 C)  Resp 16  Ht 5\' 8"  (1.727 m)  Wt 248 lb (112.492 kg)  BMI 37.72 kg/m2   Alert and oriented x 3.  Memory function appears to be intact.  Concentration and attention are normal for  educational level and background.  Speech is fluent and without significant word finding difficulty.  Is aware of current events.  No carotid bruits detected.  Cranial nerve II through XII are within normal limits.  This includes normal optic discs and acuity, EOMI, PERLA, facial movement and sensation intact, hearing grossly intact, gag intact,Uvula raises symmetrically and tongue protrudes evenly. Motor strength is 5 over 5 throughout all limbs.  No atrophy, abnormal tone or tremors. Reflexes are 2+ and symmetric in the upper and lower extremities Sensory exam is intact. Coordination is intact for fine movements and rapid alternating movements in all limbs Gait and station are normal.   Impression: 1.  Sleep disturbance with difficulty maintaining sleep.  It seems fairly clear that there is a stress component to this as she sleeps somewhat better when she is away on vacation and when she is at home and working.  In addition there is some suspicion of sleep apnea syndrome and she has observed a falling episode which correlated to a degree with her weight level.  2. Mild short-term memory impairment which I feel is more a symptom of lifestyle and sleep issues that it is a primary disorder.  3.  Overweight, back pain, knee pain, fatigue and muscle aching as well as carpal tunnel syndrome.  It may be time for her to reevaluate her priorities and to make sure she has not had to take care of her sleep and physical needs if she wants optimum health in the future.  Plan: 1.  PSG study to see if there is evidence of obstructive sleep apnea at this time 2. She can continue over-the-counter sleep aids as needed at this time.  She prefers not to have any prescription medications. 3.  Sensible weight loss is advised. 4.  If I could prescribe a vacation or a retreat so that she can reevaluate her priorities and learn to let go of stress better, I would. 5. Return in 6 weeks.

## 2012-07-11 NOTE — Patient Instructions (Addendum)
Your sleep study is scheduled for Thursday, April 10th at 8:00 PM at the [facility]. You will receive information in the mail about this procedure.   [phone]  Follow up with Dr. Smiley Houseman in 6 weeks.

## 2012-08-02 ENCOUNTER — Ambulatory Visit (HOSPITAL_BASED_OUTPATIENT_CLINIC_OR_DEPARTMENT_OTHER): Payer: BC Managed Care – PPO

## 2012-08-29 ENCOUNTER — Ambulatory Visit: Payer: BC Managed Care – PPO | Admitting: Neurology

## 2013-01-09 ENCOUNTER — Other Ambulatory Visit: Payer: Self-pay | Admitting: Obstetrics and Gynecology

## 2013-01-09 DIAGNOSIS — Z1231 Encounter for screening mammogram for malignant neoplasm of breast: Secondary | ICD-10-CM

## 2013-01-30 ENCOUNTER — Ambulatory Visit
Admission: RE | Admit: 2013-01-30 | Discharge: 2013-01-30 | Disposition: A | Discharge status: Home / Self Care | Payer: No Typology Code available for payment source | Source: Ambulatory Visit | Attending: Obstetrics and Gynecology | Admitting: Obstetrics and Gynecology

## 2013-01-30 DIAGNOSIS — Z1231 Encounter for screening mammogram for malignant neoplasm of breast: Secondary | ICD-10-CM

## 2014-01-22 ENCOUNTER — Telehealth: Payer: Self-pay | Admitting: Internal Medicine

## 2014-01-22 NOTE — Telephone Encounter (Signed)
Pt states she is not feeling well, no fever, but just not well. Pt has cough, no sore throat and would like appt tomorrow, thurs. 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  Is it ok to schedule.?

## 2014-01-22 NOTE — Telephone Encounter (Signed)
Pt scheduled  

## 2014-01-22 NOTE — Telephone Encounter (Signed)
Certainly okay to schedule.  If [facility] does not have any available slots than please schedule with Kela Millin, PAC. Thanks!

## 2014-01-22 NOTE — Telephone Encounter (Signed)
lmovm to cb and sch

## 2014-01-23 ENCOUNTER — Ambulatory Visit (INDEPENDENT_AMBULATORY_CARE_PROVIDER_SITE_OTHER): Payer: No Typology Code available for payment source | Admitting: Internal Medicine

## 2014-01-23 ENCOUNTER — Encounter: Payer: Self-pay | Admitting: Internal Medicine

## 2014-01-23 VITALS — BP 130/90 | Temp 98.5°F | Wt 250.0 lb

## 2014-01-23 DIAGNOSIS — B9789 Other viral agents as the cause of diseases classified elsewhere: Secondary | ICD-10-CM

## 2014-01-23 DIAGNOSIS — B349 Viral infection, unspecified: Secondary | ICD-10-CM

## 2014-01-23 DIAGNOSIS — R059 Cough, unspecified: Secondary | ICD-10-CM

## 2014-01-23 DIAGNOSIS — M545 Low back pain, unspecified: Secondary | ICD-10-CM

## 2014-01-23 DIAGNOSIS — J988 Other specified respiratory disorders: Principal | ICD-10-CM

## 2014-01-23 DIAGNOSIS — R05 Cough: Secondary | ICD-10-CM

## 2014-01-23 DIAGNOSIS — R109 Unspecified abdominal pain: Secondary | ICD-10-CM

## 2014-01-23 LAB — POCT URINALYSIS DIPSTICK
BILIRUBIN UA: NEGATIVE
GLUCOSE UA: NEGATIVE
KETONES UA: NEGATIVE
Nitrite, UA: NEGATIVE
Protein, UA: NEGATIVE
RBC UA: NEGATIVE
Spec Grav, UA: 1.015
Urobilinogen, UA: 0.2
pH, UA: 7.5

## 2014-01-23 NOTE — Progress Notes (Signed)
Pre visit review using our clinic review tool, if applicable. No additional management support is needed unless otherwise documented below in the visit note.  Chief Complaint  Patient presents with  . Flank Pain  . Back Pain  . Cough    HPI: Patient Briana Ferguson  comes in today for SDA for  new problem evaluation. 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   Today a bit better   bain a bit better.    Feverish  No shaking chills some hot cold.  No v d .  No unusual rashes.  No uti sx but frequency .   Was a bit worried yesterday cause discomfort was on left side of body  ROS: See pertinent positives and negatives per HPI.  Past Medical History  Diagnosis Date  . Allergy   . Attention and concentration deficit 2011    had med trial Dr Candis Schatz ? dx     Family History  Problem Relation Age of Onset  . Diabetes Father   . Stroke Father   . Hyperlipidemia      fhx  . Cancer      granfather/liver    History   Social History  . Marital Status: Married    Spouse Name: N/A    Number of Children: N/A  . Years of Education: N/A   Social History Main Topics  . Smoking status: Never Smoker   . Smokeless tobacco: Never Used  . Alcohol Use: No     Comment: none  . Drug Use: No  . Sexual Activity: None   Other Topics Concern  . 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    Married    [place]   Of.   6   Pet dog  Outside pet.      Has smoke detector and wears seat belts.  No firearms. Stored safely .Sees dentist regularly . No depression       CB x 3    Husband with vascectomy             Outpatient Encounter Prescriptions as of 01/23/2014  Medication Sig  . fluticasone (FLONASE) 50 MCG/ACT nasal spray 2 sprays each nostril each day  . MAGNESIUM PO Take 2,600 mg by  mouth at bedtime.  Marland Kitchen OVER THE COUNTER MEDICATION LIPOZENE  . Probiotic Product (PROBIOTIC DAILY PO) Take by mouth.    EXAM:  BP 130/90  Temp(Src) 98.5 F (36.9 C) (Oral)  Wt 250 lb (113.399 kg)  Body mass index is 38.02 kg/(m^2).  GENERAL: vitals reviewed and listed above, alert, oriented, appears well hydrated and in no acute distress HEENT: atraumatic, conjunctiva  clear, no obvious abnormalities on inspection of external nose and ears OP : no lesion edema or exudate  NECK: no obvious masses on inspection palpation  LUNGS: clear to auscultation bilaterally, no wheezes, rales or rhonchi, good air movement CV: HRRR, no clubbing cyanosis or  peripheral edema nl cap refill  Abdomen:  Sof,t normal bowel sounds without hepatosplenomegaly, no guarding rebound or masses no CVA tenderness MS: moves all extremities without noticeable focal  abnormality PSYCH: pleasant and cooperative, no obvious depression or anxiety  ASSESSMENT AND PLAN:  Discussed  the following assessment and plan:  Viral respiratory infection - most likely  improved today  exp mana and alarm features to [place]   Bilateral low back pain without sciatica - Plan: POC Urinalysis Dipstick  Flank pain - Plan: POC Urinalysis Dipstick  Cough prob viral early prodrome   Disc bp sleep ( better some  Taking unisom)    Fu if unexpected sx  And plan cpx  Daughter worried not taking care of herself.  -Patient advised to return or notify health care team  if symptoms worsen ,persist or new concerns arise.  Patient Instructions  Suspect early respiratory infection .  Bp   Readings  Want to be below 140/90  Get  Blood pressure monitor  And get readings .   Get CPX appt   .   Standley Brooking. Panosh M.D.  Total visit 109mins > 50% spent counseling and coordinating care

## 2014-01-23 NOTE — Patient Instructions (Addendum)
Suspect early respiratory infection .  Bp   Readings  Want to be below 140/90  Get  Blood pressure monitor  And get readings .   Get CPX appt   .

## 2014-01-28 ENCOUNTER — Ambulatory Visit (INDEPENDENT_AMBULATORY_CARE_PROVIDER_SITE_OTHER): Payer: No Typology Code available for payment source | Admitting: Internal Medicine

## 2014-01-28 ENCOUNTER — Encounter: Payer: Self-pay | Admitting: Internal Medicine

## 2014-01-28 VITALS — BP 136/80 | Temp 98.1°F | Ht 68.0 in | Wt 250.4 lb

## 2014-01-28 DIAGNOSIS — Z0000 Encounter for general adult medical examination without abnormal findings: Secondary | ICD-10-CM | POA: Insufficient documentation

## 2014-01-28 DIAGNOSIS — Z1211 Encounter for screening for malignant neoplasm of colon: Secondary | ICD-10-CM

## 2014-01-28 DIAGNOSIS — R0683 Snoring: Secondary | ICD-10-CM

## 2014-01-28 DIAGNOSIS — Z136 Encounter for screening for cardiovascular disorders: Secondary | ICD-10-CM

## 2014-01-28 DIAGNOSIS — Z23 Encounter for immunization: Secondary | ICD-10-CM

## 2014-01-28 DIAGNOSIS — R030 Elevated blood-pressure reading, without diagnosis of hypertension: Secondary | ICD-10-CM | POA: Insufficient documentation

## 2014-01-28 DIAGNOSIS — G478 Other sleep disorders: Secondary | ICD-10-CM

## 2014-01-28 DIAGNOSIS — G4730 Sleep apnea, unspecified: Secondary | ICD-10-CM

## 2014-01-28 DIAGNOSIS — R5383 Other fatigue: Secondary | ICD-10-CM | POA: Insufficient documentation

## 2014-01-28 LAB — CBC WITH DIFFERENTIAL/PLATELET
Basophils Absolute: 0 10*3/uL (ref 0.0–0.1)
Basophils Relative: 0.4 % (ref 0.0–3.0)
EOS PCT: 2.4 % (ref 0.0–5.0)
Eosinophils Absolute: 0.1 10*3/uL (ref 0.0–0.7)
HEMATOCRIT: 38.3 % (ref 36.0–46.0)
Hemoglobin: 12.8 g/dL (ref 12.0–15.0)
LYMPHS ABS: 1.6 10*3/uL (ref 0.7–4.0)
Lymphocytes Relative: 36 % (ref 12.0–46.0)
MCHC: 33.4 g/dL (ref 30.0–36.0)
MCV: 85.5 fl (ref 78.0–100.0)
Monocytes Absolute: 0.5 10*3/uL (ref 0.1–1.0)
Monocytes Relative: 10.5 % (ref 3.0–12.0)
NEUTROS ABS: 2.3 10*3/uL (ref 1.4–7.7)
Neutrophils Relative %: 50.7 % (ref 43.0–77.0)
Platelets: 296 10*3/uL (ref 150.0–400.0)
RBC: 4.48 Mil/uL (ref 3.87–5.11)
RDW: 13.7 % (ref 11.5–15.5)
WBC: 4.5 10*3/uL (ref 4.0–10.5)

## 2014-01-28 NOTE — Patient Instructions (Addendum)
Will notify you  of labs when available. will refer for colonoscopy.  Concern about significant sleep apnea will we refer for evaluation. Blood pressure again elevated today get a monitor Take blood pressure readings twice a day for 7- 10 days and then periodically .  Marland KitchenSend in readings    Goal below 140/90  Life style  consdier medication  If  Life style not controlling in 3 months . ekg shows some changes that could be  Normal for  You   Will  review the rcord  Consider further evaluation. Skin areas  Not concerning  But follow up if changing.   Healthy lifestyle includes : At least 150 minutes of exercise weeks  , weight at healthy levels, which is usually   BMI 19-25. Avoid trans fats and processed foods;  Increase fresh fruits and veges to 5 servings per day. And avoid sweet beverages including tea and juice. Mediterranean diet with olive oil and nuts have been noted to be heart and brain healthy . Avoid tobacco products . Limit  alcohol to  7 per week for women and 14 servings for men.  Get adequate sleep . Wear seat belts . Don't text and drive .   Managing Your High Blood Pressure Blood pressure is a measurement of how forceful your blood is pressing against the walls of the arteries. Arteries are muscular tubes within the circulatory system. Blood pressure does not stay the same. Blood pressure rises when you are active, excited, or nervous; and it lowers during sleep and relaxation. If the numbers measuring your blood pressure stay above normal most of the time, you are at risk for health problems. High blood pressure (hypertension) is a long-term (chronic) condition in which blood pressure is elevated. A blood pressure reading is recorded as two numbers, such as 120 over 80 (or 120/80). The first, higher number is called the systolic pressure. It is a measure of the pressure in your arteries as the heart beats. The second, lower number is called the diastolic pressure. It is a measure  of the pressure in your arteries as the heart relaxes between beats.  Keeping your blood pressure in a normal range is important to your overall health and prevention of health problems, such as heart disease and stroke. When your blood pressure is uncontrolled, your heart has to work harder than normal. High blood pressure is a very common condition in adults because blood pressure tends to rise with age. Men and women are equally likely to have hypertension but at different times in life. Before age 48, men are more likely to have hypertension. After 51 years of age, women are more likely to have it. Hypertension is especially common in African Americans. This condition often has no signs or symptoms. The cause of the condition is usually not known. Your caregiver can help you come up with a plan to keep your blood pressure in a normal, healthy range. BLOOD PRESSURE STAGES Blood pressure is classified into four stages: normal, prehypertension, stage 1, and stage 2. Your blood pressure reading will be used to determine what type of treatment, if any, is necessary. Appropriate treatment options are tied to these four stages:  Normal  Systolic pressure (mm Hg): below 120.  Diastolic pressure (mm Hg): below 80. Prehypertension  Systolic pressure (mm Hg): 120 to 139.  Diastolic pressure (mm Hg): 80 to 89. Stage1  Systolic pressure (mm Hg): 140 to 159.  Diastolic pressure (mm Hg): 90 to 99. Stage2  Systolic pressure (mm Hg): 160 or above.  Diastolic pressure (mm Hg): 100 or above. RISKS RELATED TO HIGH BLOOD PRESSURE Managing your blood pressure is an important responsibility. Uncontrolled high blood pressure can lead to:  A heart attack.  A stroke.  A weakened blood vessel (aneurysm).  Heart failure.  Kidney damage.  Eye damage.  Metabolic syndrome.  Memory and concentration problems. HOW TO MANAGE YOUR BLOOD PRESSURE Blood pressure can be managed effectively with lifestyle  changes and medicines (if needed). Your caregiver will help you come up with a plan to bring your blood pressure within a normal range. Your plan should include the following: Education  Read all information provided by your caregivers about how to control blood pressure.  Educate yourself on the latest guidelines and treatment recommendations. New research is always being done to further define the risks and treatments for high blood pressure. Lifestylechanges  Control your weight.  Avoid smoking.  Stay physically active.  Reduce the amount of salt in your diet.  Reduce stress.  Control any chronic conditions, such as high cholesterol or diabetes.  Reduce your alcohol intake. Medicines  Several medicines (antihypertensive medicines) are available, if needed, to bring blood pressure within a normal range. Communication  Review all the medicines you take with your caregiver because there may be side effects or interactions.  Talk with your caregiver about your diet, exercise habits, and other lifestyle factors that may be contributing to high blood pressure.  See your caregiver regularly. Your caregiver can help you create and adjust your plan for managing high blood pressure. RECOMMENDATIONS FOR TREATMENT AND FOLLOW-UP  The following recommendations are based on current guidelines for managing high blood pressure in nonpregnant adults. Use these recommendations to identify the proper follow-up period or treatment option based on your blood pressure reading. You can discuss these options with your caregiver.  Systolic pressure of 001 to 749 or diastolic pressure of 80 to 89: Follow up with your caregiver as directed.  Systolic pressure of 449 to 675 or diastolic pressure of 90 to 100: Follow up with your caregiver within 2 months.  Systolic pressure above 916 or diastolic pressure above 384: Follow up with your caregiver within 1 month.  Systolic pressure above 665 or  diastolic pressure above 993: Consider antihypertensive therapy; follow up with your caregiver within 1 week.  Systolic pressure above 570 or diastolic pressure above 177: Begin antihypertensive therapy; follow up with your caregiver within 1 week. Document Released: 01/04/2012 Document Reviewed: 01/04/2012 [facility] Patient Information 2015 [place]. This information is not intended to replace advice given to you by your health care provider. Make sure you discuss any questions you have with your health care provider.  DASH Eating Plan DASH stands for "Dietary Approaches to Stop Hypertension." The DASH eating plan is a healthy eating plan that has been shown to reduce high blood pressure (hypertension). Additional health benefits may include reducing the risk of type 2 diabetes mellitus, heart disease, and stroke. The DASH eating plan may also help with weight loss. WHAT DO I NEED TO KNOW ABOUT THE DASH EATING PLAN? For the DASH eating plan, you will follow these general guidelines:  Choose foods with a percent daily value for sodium of less than 5% (as listed on the food label).  Use salt-free seasonings or herbs instead of table salt or sea salt.  Check with your health care provider or pharmacist before using salt substitutes.  Eat lower-sodium products, often labeled as "lower sodium"  or "no salt added."  Eat fresh foods.  Eat more vegetables, fruits, and low-fat dairy products.  Choose whole grains. Look for the word "whole" as the first word in the ingredient list.  Choose fish and skinless chicken or [place] more often than red meat. Limit fish, poultry, and meat to 6 oz (170 g) each day.  Limit sweets, desserts, sugars, and sugary drinks.  Choose heart-healthy fats.  Limit cheese to 1 oz (28 g) per day.  Eat more home-cooked food and less restaurant, buffet, and fast food.  Limit fried foods.  Cook foods using methods other than frying.  Limit canned vegetables.  If you do use them, rinse them well to decrease the sodium.  When eating at a restaurant, ask that your food be prepared with less salt, or no salt if possible. WHAT FOODS CAN I EAT? Seek help from a dietitian for individual calorie needs. Grains Whole grain or whole wheat bread. Brown rice. Whole grain or whole wheat pasta. Quinoa, bulgur, and whole grain cereals. Low-sodium cereals. Corn or whole wheat flour tortillas. Whole grain cornbread. Whole grain crackers. Low-sodium crackers. Vegetables Fresh or frozen vegetables (raw, steamed, roasted, or grilled). Low-sodium or reduced-sodium tomato and vegetable juices. Low-sodium or reduced-sodium tomato sauce and paste. Low-sodium or reduced-sodium canned vegetables.  Fruits All fresh, canned (in natural juice), or frozen fruits. Meat and Other Protein Products Ground beef (85% or leaner), grass-fed beef, or beef trimmed of fat. Skinless chicken or [place]. Ground chicken or [place]. Pork trimmed of fat. All fish and seafood. Eggs. Dried beans, peas, or lentils. Unsalted nuts and seeds. Unsalted canned beans. Dairy Low-fat dairy products, such as skim or 1% milk, 2% or reduced-fat cheeses, low-fat ricotta or cottage cheese, or plain low-fat yogurt. Low-sodium or reduced-sodium cheeses. Fats and Oils Tub margarines without trans fats. Light or reduced-fat mayonnaise and salad dressings (reduced sodium). Avocado. Safflower, olive, or canola oils. Natural peanut or almond butter. Other Unsalted popcorn and pretzels. The items listed above may not be a complete list of recommended foods or beverages. Contact your dietitian for more options. WHAT FOODS ARE NOT RECOMMENDED? Grains White bread. White pasta. White rice. Refined cornbread. Bagels and croissants. Crackers that contain trans fat. Vegetables Creamed or fried vegetables. Vegetables in a cheese sauce. Regular canned vegetables. Regular canned tomato sauce and paste. Regular tomato and  vegetable juices. Fruits Dried fruits. Canned fruit in light or heavy syrup. Fruit juice. Meat and Other Protein Products Fatty cuts of meat. Ribs, chicken wings, bacon, sausage, bologna, salami, chitterlings, fatback, hot dogs, bratwurst, and packaged luncheon meats. Salted nuts and seeds. Canned beans with salt. Dairy Whole or 2% milk, cream, half-and-half, and cream cheese. Whole-fat or sweetened yogurt. Full-fat cheeses or blue cheese. Nondairy creamers and whipped toppings. Processed cheese, cheese spreads, or cheese curds. Condiments Onion and garlic salt, seasoned salt, table salt, and sea salt. Canned and packaged gravies. Worcestershire sauce. Tartar sauce. Barbecue sauce. Teriyaki sauce. Soy sauce, including reduced sodium. Steak sauce. Fish sauce. Oyster sauce. Cocktail sauce. Horseradish. Ketchup and mustard. Meat flavorings and tenderizers. Bouillon cubes. Hot sauce. Tabasco sauce. Marinades. Taco seasonings. Relishes. Fats and Oils Butter, stick margarine, lard, shortening, ghee, and bacon fat. Coconut, palm kernel, or palm oils. Regular salad dressings. Other Pickles and olives. Salted popcorn and pretzels. The items listed above may not be a complete list of foods and beverages to avoid. Contact your dietitian for more information. WHERE CAN I FIND MORE INFORMATION? National Heart, Lung, and Blood  Institute: [employer]abl[employer] Document Released: 03/31/2011 Document Revised: 08/26/2013 Document Reviewed: 02/13/2013 [facility] Patient Information 2015 [place], [place]. This information is not intended to replace advice given to you by your health care provider. Make sure you discuss any questions you have with your health care provider.

## 2014-01-28 NOTE — Progress Notes (Signed)
Pre visit review using our clinic review tool, if applicable. No additional management support is needed unless otherwise documented below in the visit note.  Chief Complaint  Patient presents with  . Annual Exam    HPI: Patient comes in today for Preventive Health Care visit  See last visit  fatigue comes and goes to have body aches yesterday no fever specifically no current chest pain or shortness of breath. ocass cough  No fever very tired and weak all over .  To get mammogram and Pap smear check a few skin areas.  Health Maintenance  Topic Date Due  . Mammogram  03/20/2013  . Colonoscopy  03/20/2013  . Influenza Vaccine  11/24/2014  . Pap Smear  01/24/2016  . Tetanus/tdap  12/25/2016   Health Maintenance Review LIFESTYLE:  Exercise:  No reg  Busy working  Tobacco/ETS: no Alcohol: none  Sugar beverages: ocass glass tea  In coffee with sugar 4 tsp.  Sleep: 6 hours  Drug use: no Colonoscopy:  Not yet  To get pap and mammo   ROS: History of severe snoring and husband concerning that she stops breathing he puts extra sounds to not wake up from the snoring saw Dr. Roselyn Bering years ago sleep test didn't happen because of insurance reasons for other no history of issue. Family history sleep apnea diabetes. Sometimes takes Benadryl to help sleep. GEN/ HEENT: No fever, significant weight changes sweats headaches vision problems hearing changes, CV/ PULM; No chest pain shortness of breath cough, syncope,edema  change in exercise tolerance. GI /GU: No adominal pain, vomiting, change in bowel habits. No blood in the stool. No significant GU symptoms. SKIN/HEME: ,no acute skin rashes suspicious lesions or bleeding. No lymphadenopathy, nodules, masses.  NEURO/ PSYCH:  No neurologic signs such as weakness numbness. No depression anxiety. IMM/ Allergy: No unusual infections.  Allergy .   REST of 12 system review negative except as per HPI   Past Medical History  Diagnosis Date  . Allergy    . Attention and concentration deficit 2011    had med trial Dr Candis Schatz ? dx     Family History  Problem Relation Age of Onset  . Diabetes Father   . Stroke Father   . Hyperlipidemia      fhx  . Cancer      granfather/liver    History   Social History  . Marital Status: Married    Spouse Name: N/A    Number of Children: N/A  . Years of Education: N/A   Social History Main Topics  . Smoking status: Never Smoker   . Smokeless tobacco: Never Used  . Alcohol Use: No     Comment: none  . Drug Use: No  . Sexual Activity: None   Other Topics Concern  . 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    Married    [place]   Of.   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      Has smoke detector and wears seat belts.  No firearms. Stored safely .Sees dentist regularly . No depression       CB x 3    Husband with vascectomy                Outpatient Encounter Prescriptions as of 01/28/2014  Medication Sig  . fluticasone (FLONASE) 50 MCG/ACT nasal spray  2 sprays each nostril each day  . MAGNESIUM PO Take 2,600 mg by mouth at bedtime.  Marland Kitchen OVER THE COUNTER MEDICATION LIPOZENE  . Probiotic Product (PROBIOTIC DAILY PO) Take by mouth.    EXAM:  BP 136/80  Temp(Src) 98.1 F (36.7 C) (Oral)  Ht 5\' 8"  (1.727 m)  Wt 250 lb 6.4 oz (113.581 kg)  BMI 38.08 kg/m2  Body mass index is 38.08 kg/(m^2).  Physical Exam: Vital signs reviewed EUM:PNTI is a well-developed well-nourished alert cooperative    who appearsr stated age in no acute distress.  HEENT: normocephalic atraumatic , Eyes: PERRL EOM's full, conjunctiva clear, Nares: paten,t no deformity discharge or tenderness., Ears: no deformity EAC's clear except +2 wax in the right but TM is visualized TMs with normal landmarks. Mouth: clear OP, no lesions, edema. Slightly low-lying palate  Moist mucous membranes. Dentition in adequate repair. NECK:  supple without masses, tor bruits. Goiter palpable no nodules CHEST/PULM:  Clear to auscultation and percussion breath sounds equal no wheeze , rales or rhonchi. No chest wall deformities or tenderness. CV: PMI is nondisplaced, S1 S2 no gallops, murmurs, rubs. Peripheral pulses are full without delay.No JVD .  Breasts no nodules or discharge area that she says is sensitive to touch I don't feel a nodule left axilla ABDOMEN: Bowel sounds normal nontender  No guard or rebound, no hepato splenomegal no CVA tenderness.  No hernia. Extremtities:  No clubbing cyanosis or edema, no acute joint swelling or redness no focal atrophy NEURO:  Oriented x3, cranial nerves 3-12 appear to be intact, no obvious focal weakness,gait within normal limits no abnormal reflexes or asymmetrical SKIN: No acute rashes normal turgor, color, no bruising or petechiae. Poss 2 mm dermatofibroma right thigh  PSYCH: Oriented, good eye contact, no obvious depression anxiety, cognition and judgment appear normal. LN: no cervical axillary inguinal adenopathy   EKG shows sinus rhythm rate 62 :computer reading ant infarct old possible; or or early reollarization variant ;inferolateral ST elevation some decreased anterior forces but no Q waves  previous  compare. Not particularly different except for QT longer.  ASSESSMENT AND PLAN:  Discussed the following assessment and plan:  Visit for preventive health examination - to get mammo and pap this year. dec sugar in coffee - Plan: EKG 14-ERXV, Basic metabolic panel, CBC with Differential, Hepatic function panel, Lipid panel, TSH, T4, free, C-reactive protein  Need for prophylactic vaccination and inoculation against influenza - Plan: Flu Vaccine QUAD 36+ mos PF IM (Fluarix Quad PF)  Screening for cardiovascular condition - Plan: EKG 12-Lead  Other fatigue - labs metabolic today suspcious for osa unable to complete eval in past  re refer.  - Plan: Basic metabolic panel, CBC with  Differential, Hepatic function panel, Lipid panel, TSH, T4, free, C-reactive protein  Elevated blood pressure reading - comes down on recheck funs in family monitor reviewed healthy ls rov in 3 months - Plan: Basic metabolic panel, CBC with Differential, Hepatic function panel, Lipid panel, TSH, T4, free, C-reactive protein  Snoring - Husband states loud snoring sometimes stopping breathing suspicious for up sleep apnea Will rerefer - Plan: Ambulatory referral to Pulmonology  Colon cancer screening - Plan: Ambulatory referral to Gastroenterology  Routine general medical examination at a health care facility  Sleep disorder breathing - Plan: Ambulatory referral to Pulmonology  Patient Care Team: Burnis Medin, MD as PCP - General Ena Dawley, MD (Obstetrics and Gynecology) Audrie Lia. Tomma Rakers, MD as Referring Physician (Neurology) Patient Instructions  Will  notify you  of labs when available. will refer for colonoscopy.  Concern about significant sleep apnea will we refer for evaluation. Blood pressure again elevated today get a monitor Take blood pressure readings twice a day for 7- 10 days and then periodically .  Marland KitchenSend in readings    Goal below 140/90  Life style  consdier medication  If  Life style not controlling in 3 months . ekg shows some changes that could be  Normal for  You   Will  review the rcord  Consider further evaluation. Skin areas  Not concerning  But follow up if changing.   Healthy lifestyle includes : At least 150 minutes of exercise weeks  , weight at healthy levels, which is usually   BMI 19-25. Avoid trans fats and processed foods;  Increase fresh fruits and veges to 5 servings per day. And avoid sweet beverages including tea and juice. Mediterranean diet with olive oil and nuts have been noted to be heart and brain healthy . Avoid tobacco products . Limit  alcohol to  7 per week for women and 14 servings for men.  Get adequate sleep . Wear seat belts . Don't  text and drive .   Managing Your High Blood Pressure Blood pressure is a measurement of how forceful your blood is pressing against the walls of the arteries. Arteries are muscular tubes within the circulatory system. Blood pressure does not stay the same. Blood pressure rises when you are active, excited, or nervous; and it lowers during sleep and relaxation. If the numbers measuring your blood pressure stay above normal most of the time, you are at risk for health problems. High blood pressure (hypertension) is a long-term (chronic) condition in which blood pressure is elevated. A blood pressure reading is recorded as two numbers, such as 120 over 80 (or 120/80). The first, higher number is called the systolic pressure. It is a measure of the pressure in your arteries as the heart beats. The second, lower number is called the diastolic pressure. It is a measure of the pressure in your arteries as the heart relaxes between beats.  Keeping your blood pressure in a normal range is important to your overall health and prevention of health problems, such as heart disease and stroke. When your blood pressure is uncontrolled, your heart has to work harder than normal. High blood pressure is a very common condition in adults because blood pressure tends to rise with age. Men and women are equally likely to have hypertension but at different times in life. Before age 70, men are more likely to have hypertension. After 51 years of age, women are more likely to have it. Hypertension is especially common in African Americans. This condition often has no signs or symptoms. The cause of the condition is usually not known. Your caregiver can help you come up with a plan to keep your blood pressure in a normal, healthy range. BLOOD PRESSURE STAGES Blood pressure is classified into four stages: normal, prehypertension, stage 1, and stage 2. Your blood pressure reading will be used to determine what type of treatment, if  any, is necessary. Appropriate treatment options are tied to these four stages:  Normal  Systolic pressure (mm Hg): below 120.  Diastolic pressure (mm Hg): below 80. Prehypertension  Systolic pressure (mm Hg): 120 to 139.  Diastolic pressure (mm Hg): 80 to 89. Stage1  Systolic pressure (mm Hg): 140 to 159.  Diastolic pressure (mm Hg): 90 to 99. Stage2  Systolic pressure (mm Hg): 160 or above.  Diastolic pressure (mm Hg): 100 or above. RISKS RELATED TO HIGH BLOOD PRESSURE Managing your blood pressure is an important responsibility. Uncontrolled high blood pressure can lead to:  A heart attack.  A stroke.  A weakened blood vessel (aneurysm).  Heart failure.  Kidney damage.  Eye damage.  Metabolic syndrome.  Memory and concentration problems. HOW TO MANAGE YOUR BLOOD PRESSURE Blood pressure can be managed effectively with lifestyle changes and medicines (if needed). Your caregiver will help you come up with a plan to bring your blood pressure within a normal range. Your plan should include the following: Education  Read all information provided by your caregivers about how to control blood pressure.  Educate yourself on the latest guidelines and treatment recommendations. New research is always being done to further define the risks and treatments for high blood pressure. Lifestylechanges  Control your weight.  Avoid smoking.  Stay physically active.  Reduce the amount of salt in your diet.  Reduce stress.  Control any chronic conditions, such as high cholesterol or diabetes.  Reduce your alcohol intake. Medicines  Several medicines (antihypertensive medicines) are available, if needed, to bring blood pressure within a normal range. Communication  Review all the medicines you take with your caregiver because there may be side effects or interactions.  Talk with your caregiver about your diet, exercise habits, and other lifestyle factors that may be  contributing to high blood pressure.  See your caregiver regularly. Your caregiver can help you create and adjust your plan for managing high blood pressure. RECOMMENDATIONS FOR TREATMENT AND FOLLOW-UP  The following recommendations are based on current guidelines for managing high blood pressure in nonpregnant adults. Use these recommendations to identify the proper follow-up period or treatment option based on your blood pressure reading. You can discuss these options with your caregiver.  Systolic pressure of 354 to 656 or diastolic pressure of 80 to 89: Follow up with your caregiver as directed.  Systolic pressure of 812 to 751 or diastolic pressure of 90 to 100: Follow up with your caregiver within 2 months.  Systolic pressure above 700 or diastolic pressure above 174: Follow up with your caregiver within 1 month.  Systolic pressure above 944 or diastolic pressure above 967: Consider antihypertensive therapy; follow up with your caregiver within 1 week.  Systolic pressure above 591 or diastolic pressure above 638: Begin antihypertensive therapy; follow up with your caregiver within 1 week. Document Released: 01/04/2012 Document Reviewed: 01/04/2012 [facility] Patient Information 2015 [place]. This information is not intended to replace advice given to you by your health care provider. Make sure you discuss any questions you have with your health care provider.  DASH Eating Plan DASH stands for "Dietary Approaches to Stop Hypertension." The DASH eating plan is a healthy eating plan that has been shown to reduce high blood pressure (hypertension). Additional health benefits may include reducing the risk of type 2 diabetes mellitus, heart disease, and stroke. The DASH eating plan may also help with weight loss. WHAT DO I NEED TO KNOW ABOUT THE DASH EATING PLAN? For the DASH eating plan, you will follow these general guidelines:  Choose foods with a percent daily value for sodium of  less than 5% (as listed on the food label).  Use salt-free seasonings or herbs instead of table salt or sea salt.  Check with your health care provider or pharmacist before using salt substitutes.  Eat lower-sodium products, often labeled as "lower sodium"  or "no salt added."  Eat fresh foods.  Eat more vegetables, fruits, and low-fat dairy products.  Choose whole grains. Look for the word "whole" as the first word in the ingredient list.  Choose fish and skinless chicken or [place] more often than red meat. Limit fish, poultry, and meat to 6 oz (170 g) each day.  Limit sweets, desserts, sugars, and sugary drinks.  Choose heart-healthy fats.  Limit cheese to 1 oz (28 g) per day.  Eat more home-cooked food and less restaurant, buffet, and fast food.  Limit fried foods.  Cook foods using methods other than frying.  Limit canned vegetables. If you do use them, rinse them well to decrease the sodium.  When eating at a restaurant, ask that your food be prepared with less salt, or no salt if possible. WHAT FOODS CAN I EAT? Seek help from a dietitian for individual calorie needs. Grains Whole grain or whole wheat bread. Brown rice. Whole grain or whole wheat pasta. Quinoa, bulgur, and whole grain cereals. Low-sodium cereals. Corn or whole wheat flour tortillas. Whole grain cornbread. Whole grain crackers. Low-sodium crackers. Vegetables Fresh or frozen vegetables (raw, steamed, roasted, or grilled). Low-sodium or reduced-sodium tomato and vegetable juices. Low-sodium or reduced-sodium tomato sauce and paste. Low-sodium or reduced-sodium canned vegetables.  Fruits All fresh, canned (in natural juice), or frozen fruits. Meat and Other Protein Products Ground beef (85% or leaner), grass-fed beef, or beef trimmed of fat. Skinless chicken or [place]. Ground chicken or [place]. Pork trimmed of fat. All fish and seafood. Eggs. Dried beans, peas, or lentils. Unsalted nuts and seeds. Unsalted  canned beans. Dairy Low-fat dairy products, such as skim or 1% milk, 2% or reduced-fat cheeses, low-fat ricotta or cottage cheese, or plain low-fat yogurt. Low-sodium or reduced-sodium cheeses. Fats and Oils Tub margarines without trans fats. Light or reduced-fat mayonnaise and salad dressings (reduced sodium). Avocado. Safflower, olive, or canola oils. Natural peanut or almond butter. Other Unsalted popcorn and pretzels. The items listed above may not be a complete list of recommended foods or beverages. Contact your dietitian for more options. WHAT FOODS ARE NOT RECOMMENDED? Grains White bread. White pasta. White rice. Refined cornbread. Bagels and croissants. Crackers that contain trans fat. Vegetables Creamed or fried vegetables. Vegetables in a cheese sauce. Regular canned vegetables. Regular canned tomato sauce and paste. Regular tomato and vegetable juices. Fruits Dried fruits. Canned fruit in light or heavy syrup. Fruit juice. Meat and Other Protein Products Fatty cuts of meat. Ribs, chicken wings, bacon, sausage, bologna, salami, chitterlings, fatback, hot dogs, bratwurst, and packaged luncheon meats. Salted nuts and seeds. Canned beans with salt. Dairy Whole or 2% milk, cream, half-and-half, and cream cheese. Whole-fat or sweetened yogurt. Full-fat cheeses or blue cheese. Nondairy creamers and whipped toppings. Processed cheese, cheese spreads, or cheese curds. Condiments Onion and garlic salt, seasoned salt, table salt, and sea salt. Canned and packaged gravies. Worcestershire sauce. Tartar sauce. Barbecue sauce. Teriyaki sauce. Soy sauce, including reduced sodium. Steak sauce. Fish sauce. Oyster sauce. Cocktail sauce. Horseradish. Ketchup and mustard. Meat flavorings and tenderizers. Bouillon cubes. Hot sauce. Tabasco sauce. Marinades. Taco seasonings. Relishes. Fats and Oils Butter, stick margarine, lard, shortening, ghee, and bacon fat. Coconut, palm kernel, or palm oils. Regular  salad dressings. Other Pickles and olives. Salted popcorn and pretzels. The items listed above may not be a complete list of foods and beverages to avoid. Contact your dietitian for more information. WHERE CAN I FIND MORE INFORMATION? National Heart, Lung, and Blood  Institute: travelstabloid.com Document Released: 03/31/2011 Document Revised: 08/26/2013 Document Reviewed: 02/13/2013 [facility] Patient Information 2015 [place], [place]. This information is not intended to replace advice given to you by your health care provider. Make sure you discuss any questions you have with your health care provider.        Standley Brooking. Elpidia Karn M.D.

## 2014-01-28 NOTE — Assessment & Plan Note (Signed)
Sleep study was not done a few years ago cause of insurance other issues  Would re evaluate based  On hx and risk  factors

## 2014-01-29 LAB — HEPATIC FUNCTION PANEL
ALBUMIN: 4 g/dL (ref 3.5–5.2)
ALT: 13 U/L (ref 0–35)
AST: 19 U/L (ref 0–37)
Alkaline Phosphatase: 81 U/L (ref 39–117)
Bilirubin, Direct: 0.1 mg/dL (ref 0.0–0.3)
Total Bilirubin: 0.5 mg/dL (ref 0.2–1.2)
Total Protein: 8 g/dL (ref 6.0–8.3)

## 2014-01-29 LAB — LIPID PANEL
CHOL/HDL RATIO: 3
Cholesterol: 166 mg/dL (ref 0–200)
HDL: 61.9 mg/dL (ref >39.00)
LDL Cholesterol: 85 mg/dL (ref 0–99)
NonHDL: 104.1
Triglycerides: 94 mg/dL (ref 0.0–149.0)
VLDL: 18.8 mg/dL (ref 0.0–40.0)

## 2014-01-29 LAB — BASIC METABOLIC PANEL
BUN: 10 mg/dL (ref 6–23)
CO2: 22 mEq/L (ref 19–32)
Calcium: 9.2 mg/dL (ref 8.4–10.5)
Chloride: 103 mEq/L (ref 96–112)
Creatinine, Ser: 0.9 mg/dL (ref 0.4–1.2)
GFR: 86.04 mL/min (ref >60.00)
Glucose, Bld: 86 mg/dL (ref 70–99)
POTASSIUM: 3.9 meq/L (ref 3.5–5.1)
Sodium: 139 mEq/L (ref 135–145)

## 2014-01-29 LAB — T4, FREE: FREE T4: 0.92 ng/dL (ref 0.60–1.60)

## 2014-01-29 LAB — TSH: TSH: 1.26 u[IU]/mL (ref 0.35–4.50)

## 2014-01-29 LAB — C-REACTIVE PROTEIN: CRP: 1.4 mg/dL (ref 0.5–20.0)

## 2014-01-31 ENCOUNTER — Ambulatory Visit (INDEPENDENT_AMBULATORY_CARE_PROVIDER_SITE_OTHER): Payer: No Typology Code available for payment source | Admitting: Pulmonary Disease

## 2014-01-31 ENCOUNTER — Encounter: Payer: Self-pay | Admitting: Pulmonary Disease

## 2014-01-31 ENCOUNTER — Institutional Professional Consult (permissible substitution): Payer: No Typology Code available for payment source | Admitting: Pulmonary Disease

## 2014-01-31 VITALS — BP 122/88 | HR 70 | Temp 98.3°F | Ht 68.0 in | Wt 254.6 lb

## 2014-01-31 DIAGNOSIS — G4733 Obstructive sleep apnea (adult) (pediatric): Secondary | ICD-10-CM

## 2014-01-31 NOTE — Assessment & Plan Note (Signed)
She has snoring, sleep disruption, witnessed apnea, and daytime sleepiness.  She has prior hx of OSA.  I am concerned she still has sleep apnea.  We discussed how sleep apnea can affect various health problems including risks for hypertension, cardiovascular disease, and diabetes.  We also discussed how sleep disruption can increase risks for accident, such as while driving.  Weight loss as a means of improving sleep apnea was also reviewed.  Additional treatment options discussed were CPAP therapy, oral appliance, and surgical intervention.  To further assess will arrange for in lab sleep study.

## 2014-01-31 NOTE — Patient Instructions (Signed)
Will arrange for sleep study Will call to arrange for follow up after sleep study reviewed 

## 2014-01-31 NOTE — Progress Notes (Deleted)
   Subjective:    Patient ID: Briana Ferguson, female    DOB: 10-12-1960, 53 y.o.   MRN: [id number]  HPI    Review of Systems  Constitutional: Negative for fever and unexpected weight change.  HENT: Negative for congestion, dental problem, ear pain, nosebleeds, postnasal drip, rhinorrhea, sinus pressure, sneezing, sore throat and trouble swallowing.        Seasonal Allergies  Eyes: Negative for redness and itching.  Respiratory: Positive for cough and shortness of breath. Negative for chest tightness and wheezing.   Cardiovascular: Negative for palpitations and leg swelling.  Gastrointestinal: Negative for nausea and vomiting.  Genitourinary: Negative for dysuria.  Musculoskeletal: Negative for joint swelling.  Skin: Negative for rash.  Neurological: Positive for headaches.  Hematological: Does not bruise/bleed easily.  Psychiatric/Behavioral: Negative for dysphoric mood. The patient is nervous/anxious.        Objective:   Physical Exam        Assessment & Plan:

## 2014-01-31 NOTE — Progress Notes (Signed)
Chief Complaint  Patient presents with  . SLEEP CONSULT    Referred by Dr Regis Bill. Sleep Study 2001/2002? Epworth Score:     History of Present Illness: Briana Ferguson is a 53 y.o. female for evaluation of sleep problems.  I had previously seen her in 2006 for insomnia.  She was then seen in 2008 for snoring.  She had sleep study in 2008 which showed mild sleep apnea.   She was seen recently by PCP.  There was concern about her daytime fatigue, and that this could be related to sleep apnea.  She was scheduled for sleep study in 2014, but this was never completed.  There is still concern she could have sleep apnea contributing to her symptoms.  As a result she was referred for further assessment.  Her husband has been concerned about her snoring, and this has gotten worse.  She has gained weight since her last evaluation in 2008. She has trouble staying awake unless she keeps herself active.  Her husband has said she will stop breathing while asleep.  She will wake up feeling like she can't breath.  She goes to sleep between 9 pm and 11 pm.  She falls asleep after 10 minutes.  She wakes up every 2 to 3 hours, and sometimes has trouble falling back to sleep.  She gets out of bed at between 4:45 and 6:30 am, depending on her work schedule.  She feels tired in the morning.  She denies morning headache.  She does not use anything to help her stay awake.  She has been using OTC sleep aides (unisom, z-quil) intermittently >> these allow her to sleep longer, but don't improve her daytime sleepiness.    She denies sleep walking, sleep talking, bruxism, or nightmares.  There is no history of restless legs.  She denies sleep hallucinations, sleep paralysis, or cataplexy.  The Epworth score is 18 out of 24.  Labs from 01/28/14 reviewed.  Tests: PSG 08/08/06 >> AHI 5, SpO2 low 89%  Briana Ferguson  has a past medical history of Environmental allergies and Attention and concentration deficit  (2011).  Briana Ferguson  has past surgical history that includes Wisdom tooth extraction.  Prior to Admission medications   Medication Sig Start Date End Date Taking? Authorizing Provider  fluticasone (FLONASE) 50 MCG/ACT nasal spray 2 sprays each nostril each day 12/28/11  Yes Burnis Medin, MD  MAGNESIUM PO Take 2,600 mg by mouth at bedtime.   Yes Historical Provider, MD  [place]   Yes Historical Provider, MD  Probiotic Product (PROBIOTIC DAILY PO) Take by mouth.   Yes Historical Provider, MD    Allergies  Allergen Reactions  . Sulfamethoxazole-Trimethoprim     REACTION: feels faint    Her family history includes Cancer in an other family member; Diabetes in her father; Hyperlipidemia in an other family member; Stroke in her father.  She  reports that she has never smoked. She has never used smokeless tobacco. She reports that she does not drink alcohol or use illicit drugs.  Review of Systems  Constitutional: Negative for fever and unexpected weight change.  HENT: Negative for congestion, dental problem, ear pain, nosebleeds, postnasal drip, rhinorrhea, sinus pressure, sneezing, sore throat and trouble swallowing.        Seasonal Allergies  Eyes: Negative for redness and itching.  Respiratory: Positive for cough and shortness of breath. Negative for chest tightness and wheezing.   Cardiovascular: Negative for palpitations  and leg swelling.  Gastrointestinal: Negative for nausea and vomiting.  Genitourinary: Negative for dysuria.  Musculoskeletal: Negative for joint swelling.  Skin: Negative for rash.  Neurological: Positive for headaches.  Hematological: Does not bruise/bleed easily.  Psychiatric/Behavioral: Negative for dysphoric mood. The patient is nervous/anxious.    Physical Exam:  General - No distress ENT - No sinus tenderness, no oral exudate, no LAN, no thyromegaly, TM clear, pupils equal/reactive, MP 3, 2+ tonsils, enlarged  tongue Cardiac - s1s2 regular, no murmur, pulses symmetric Chest - No wheeze/rales/dullness, good air entry, normal respiratory excursion Back - No focal tenderness Abd - Soft, non-tender, no organomegaly, + bowel sounds Ext - No edema Neuro - Normal strength, cranial nerves intact Skin - No rashes Psych - Normal mood, and behavior  Assessment/plan:  Chesley Mires, M.D. Pager [phone]

## 2014-02-24 ENCOUNTER — Other Ambulatory Visit: Payer: Self-pay

## 2014-02-24 DIAGNOSIS — Z1231 Encounter for screening mammogram for malignant neoplasm of breast: Secondary | ICD-10-CM

## 2014-03-04 ENCOUNTER — Encounter (INDEPENDENT_AMBULATORY_CARE_PROVIDER_SITE_OTHER): Payer: Self-pay

## 2014-03-04 ENCOUNTER — Ambulatory Visit
Admission: RE | Admit: 2014-03-04 | Discharge: 2014-03-04 | Disposition: A | Discharge status: Home / Self Care | Payer: No Typology Code available for payment source | Source: Ambulatory Visit

## 2014-03-04 DIAGNOSIS — Z1231 Encounter for screening mammogram for malignant neoplasm of breast: Secondary | ICD-10-CM

## 2014-03-12 ENCOUNTER — Institutional Professional Consult (permissible substitution): Payer: No Typology Code available for payment source | Admitting: Pulmonary Disease

## 2014-04-15 ENCOUNTER — Ambulatory Visit (HOSPITAL_BASED_OUTPATIENT_CLINIC_OR_DEPARTMENT_OTHER): Payer: No Typology Code available for payment source

## 2014-04-30 ENCOUNTER — Ambulatory Visit (INDEPENDENT_AMBULATORY_CARE_PROVIDER_SITE_OTHER): Payer: No Typology Code available for payment source | Admitting: Internal Medicine

## 2014-04-30 ENCOUNTER — Ambulatory Visit: Payer: No Typology Code available for payment source | Admitting: Internal Medicine

## 2014-04-30 ENCOUNTER — Encounter: Payer: Self-pay | Admitting: Internal Medicine

## 2014-04-30 VITALS — BP 140/88 | Temp 98.6°F | Ht 68.0 in | Wt 255.0 lb

## 2014-04-30 DIAGNOSIS — L709 Acne, unspecified: Secondary | ICD-10-CM

## 2014-04-30 DIAGNOSIS — R05 Cough: Secondary | ICD-10-CM

## 2014-04-30 DIAGNOSIS — R030 Elevated blood-pressure reading, without diagnosis of hypertension: Secondary | ICD-10-CM

## 2014-04-30 DIAGNOSIS — R0683 Snoring: Secondary | ICD-10-CM

## 2014-04-30 DIAGNOSIS — R059 Cough, unspecified: Secondary | ICD-10-CM

## 2014-04-30 MED ORDER — FLUTICASONE PROPIONATE 50 MCG/ACT NA SUSP: NASAL | Status: DC

## 2014-04-30 NOTE — Progress Notes (Signed)
Pre visit review using our clinic review tool, if applicable. No additional management support is needed unless otherwise documented below in the visit note.  Chief Complaint  Patient presents with  . Follow-up    HPI: Briana Ferguson 54 y.o. Fu of elevaetd blood pressure usuallly in  140  Or high 130   Right arm and back an issue to day  Cough not going away.   Not back on flonase .   Postpone  osa  Insurance at this time.   Says bp better  fam hx etc . Has acne on face no matter  Uncertain why so much  Had a good deal when a teen  No menses no inc body hair   ROS: See pertinent positives and negatives per HPI.  Past Medical History  Diagnosis Date  . Environmental allergies   . Attention and concentration deficit 2011    had med trial Dr Candis Schatz ? dx     Family History  Problem Relation Age of Onset  . Diabetes Father   . Stroke Father     deceased  . Hyperlipidemia      fhx  . Cancer      granfather/liver    History   Social History  . Marital Status: Married    Spouse Name: N/A    Number of Children: N/A  . Years of Education: N/A   Occupational History  . Director [employer]    Social History Main Topics  . Smoking status: Never Smoker   . Smokeless tobacco: Never Used  . Alcohol Use: No     Comment: none  . Drug Use: No  . Sexual Activity: None   Other Topics Concern  . 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    Married    [place]   Of.   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      Has smoke detector and wears seat belts.  No firearms. Stored safely .Sees dentist regularly . No depression       CB x 3    Husband with vascectomy                Outpatient Encounter Prescriptions as of 04/30/2014  Medication Sig  . Digestive Enzymes (ENZYME DIGEST PO) Take by mouth as needed.  . fluticasone (FLONASE) 50 MCG/ACT nasal spray 2 sprays each nostril each  day  . MAGNESIUM PO Take 2,600 mg by mouth at bedtime.  Marland Kitchen OVER THE COUNTER MEDICATION LIPOZENE  . Probiotic Product (PROBIOTIC DAILY PO) Take by mouth.  . [DISCONTINUED] fluticasone (FLONASE) 50 MCG/ACT nasal spray 2 sprays each nostril each day    EXAM:  BP 140/88 mmHg  Temp(Src) 98.6 F (37 C) (Oral)  Ht 5\' 8"  (1.727 m)  Wt 255 lb (115.667 kg)  BMI 38.78 kg/m2  Body mass index is 38.78 kg/(m^2).  GENERAL: vitals reviewed and listed above, alert, oriented, appears well hydrated and in no acute distress HEENT: atraumatic, conjunctiva  clear, no obvious abnormalities on inspection of external nose and ears OP : no lesion edema or exudate low palate  NECK: no obvious masses on inspection palpation  LUNGS: clear to auscultation bilaterally, no wheezes, rales or rhonchi, good air movement CV: HRRR, no clubbing cyanosis or  peripheral edema nl cap refill  MS: moves all extremities without noticeable  focal  Abnormality Skin facila acne  Papules some inflammatry lesions  PSYCH: pleasant and cooperative, no obvious depression or anxiety BP Readings from Last 3 Encounters:  04/30/14 140/88  01/31/14 122/88  01/28/14 136/80   Lab Results  Component Value Date   WBC 4.5 01/28/2014   HGB 12.8 01/28/2014   HCT 38.3 01/28/2014   PLT 296.0 01/28/2014   GLUCOSE 86 01/28/2014   CHOL 166 01/28/2014   TRIG 94.0 01/28/2014   HDL 61.90 01/28/2014   LDLCALC 85 01/28/2014   ALT 13 01/28/2014   AST 19 01/28/2014   NA 139 01/28/2014   K 3.9 01/28/2014   CL 103 01/28/2014   CREATININE 0.9 01/28/2014   BUN 10 01/28/2014   CO2 22 01/28/2014   TSH 1.26 01/28/2014   Wt Readings from Last 3 Encounters:  04/30/14 255 lb (115.667 kg)  01/31/14 254 lb 9.6 oz (115.486 kg)  01/28/14 250 lb 6.4 oz (113.581 kg)     ASSESSMENT AND PLAN:  Discussed the following assessment and plan:  Elevated blood pressure reading - improved and normal at gyne 124/70 borderline in office   Adult acne -  advised see derm is post peri menopausal  Snoring - stil suspicious for osa  insurance approval pending  proceed as possible  Cough - poss pnd add flonase back   -Patient advised to return or notify health care team  if symptoms worsen ,persist or new concerns arise.  Patient Instructions  Restart flonase  Nasal spray . every day and see if helps the cough .Marland Kitchen Continue lifestyle intervention healthy eating and exercise . And we can    follo wup. Continue ocass monitor  Goal is below 140/90  .  Follow thought with the sleep eval.    ROV in 2-3 months  Bring in readings for bp  Call in interim if need [place] .      Standley Brooking. Shalayah Beagley M.D.

## 2014-04-30 NOTE — Patient Instructions (Addendum)
Restart flonase  Nasal spray . every day and see if helps the cough .Marland Kitchen Continue lifestyle intervention healthy eating and exercise . And we can    follo wup. Continue ocass monitor  Goal is below 140/90  .  Follow thought with the sleep eval.    ROV in 2-3 months  Bring in readings for bp  Call in interim if need [place] .

## 2014-07-03 ENCOUNTER — Ambulatory Visit (INDEPENDENT_AMBULATORY_CARE_PROVIDER_SITE_OTHER): Payer: No Typology Code available for payment source | Admitting: Internal Medicine

## 2014-07-03 ENCOUNTER — Encounter: Payer: Self-pay | Admitting: Internal Medicine

## 2014-07-03 VITALS — BP 122/70 | Temp 98.9°F | Wt 251.2 lb

## 2014-07-03 DIAGNOSIS — R030 Elevated blood-pressure reading, without diagnosis of hypertension: Secondary | ICD-10-CM

## 2014-07-03 DIAGNOSIS — G478 Other sleep disorders: Secondary | ICD-10-CM

## 2014-07-03 DIAGNOSIS — G4730 Sleep apnea, unspecified: Secondary | ICD-10-CM

## 2014-07-03 NOTE — Progress Notes (Signed)
Chief Complaint  Patient presents with  . 2 month follow up    HPI: Briana Ferguson 54 y.o. here for fu of BP elevated  See last note in January . Doing better  Getting home and trying to distress  . Has paused the insurance .  Until  sworked out for testing.   flonase has helped also .snoring    Doing weight loss program. "food lovers diet"  12 minutes cardio.  has lost  some weight  . ROS: See pertinent positives and negatives per HPI.  Past Medical History  Diagnosis Date  . Environmental allergies   . Attention and concentration deficit 2011    had med trial Dr Candis Schatz ? dx     Family History  Problem Relation Age of Onset  . Diabetes Father   . Stroke Father     deceased  . Hyperlipidemia      fhx  . Cancer      granfather/liver    History   Social History  . Marital Status: Married    Spouse Name: N/A  . Number of Children: N/A  . Years of Education: N/A   Occupational History  . Director [employer]    Social History Main Topics  . Smoking status: Never Smoker   . Smokeless tobacco: Never Used  . Alcohol Use: No     Comment: none  . Drug Use: No  . Sexual Activity: Not on file   Other Topics Concern  . 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    Married    [place]   Of.   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      Has smoke detector and wears seat belts.  No firearms. Stored safely .Sees dentist regularly . No depression       CB x 3    Husband with vascectomy                Outpatient Encounter Prescriptions as of 07/03/2014  Medication Sig  . Digestive Enzymes (ENZYME DIGEST PO) Take by mouth as needed.  . fluticasone (FLONASE) 50 MCG/ACT nasal spray 2 sprays each nostril each day  . MAGNESIUM PO Take 2,600 mg by mouth at bedtime.  Marland Kitchen OVER THE COUNTER MEDICATION LIPOZENE  . Probiotic Product (PROBIOTIC DAILY PO) Take by mouth.    EXAM:  BP  122/70 mmHg  Temp(Src) 98.9 F (37.2 C) (Oral)  Wt 251 lb 3.2 oz (113.944 kg)  Body mass index is 38.2 kg/(m^2). bp confirmed  GENERAL: vitals reviewed and listed above, alert, oriented, appears well hydrated and in no acute distress HEENT: atraumatic, conjunctiva  clear, no obvious abnormalities on inspection of external nose and ears CV: HRRR, no clubbing cyanosis or  peripheral edema nl cap refill  MS: moves all extremities without noticeable focal  abnormality PSYCH: pleasant and cooperative, no obvious depression or anxiety BP Readings from Last 3 Encounters:  07/03/14 122/70  04/30/14 140/88  01/31/14 122/88   Wt Readings from Last 3 Encounters:  07/03/14 251 lb 3.2 oz (113.944 kg)  04/30/14 255 lb (115.667 kg)  01/31/14 254 lb 9.6 oz (115.486 kg)    ASSESSMENT AND PLAN:  Discussed the following assessment and plan:  Elevated blood pressure reading - much better with lsi  continue monitro etc   Sleep disorder breathing -  continue weigh tloss flonase and as per pulmonary sleep  -Patient advised to return or notify health care team  if symptoms worsen ,persist or new concerns arise.  Patient Instructions  Continue lifestyle intervention healthy eating and exercise . And monitoring  blood pressure . Bring monitor to a visit. To ensure  Continued control .  If ok then labs and yearly check.  Blood sugar nl last time  Advise  Sign up for [employer] K. Panosh M.D.  Pre visit review using our clinic review tool, if applicable. No additional management support is needed unless otherwise documented below in the visit note.

## 2014-07-03 NOTE — Patient Instructions (Addendum)
Continue lifestyle intervention healthy eating and exercise . And monitoring  blood pressure . Bring monitor to a visit. To ensure  Continued control .  If ok then labs and yearly check.  Blood sugar nl last time  Advise  Sign up for [employer]

## 2014-07-09 ENCOUNTER — Telehealth: Payer: Self-pay | Admitting: Internal Medicine

## 2014-07-09 ENCOUNTER — Encounter: Payer: Self-pay | Admitting: Family Medicine

## 2014-07-09 NOTE — Telephone Encounter (Signed)
Pt needs work note that she was here on 07-03-14  Fax [phone]

## 2014-07-10 NOTE — Telephone Encounter (Signed)
Sent by fax on 07/09/14.  Received transmission notice that the fax was successful.

## 2015-01-19 ENCOUNTER — Encounter: Payer: Self-pay | Admitting: Internal Medicine

## 2015-01-23 ENCOUNTER — Other Ambulatory Visit (INDEPENDENT_AMBULATORY_CARE_PROVIDER_SITE_OTHER): Payer: No Typology Code available for payment source

## 2015-01-23 DIAGNOSIS — Z0000 Encounter for general adult medical examination without abnormal findings: Secondary | ICD-10-CM | POA: Diagnosis not present

## 2015-01-23 LAB — BASIC METABOLIC PANEL
BUN: 12 mg/dL (ref 6–23)
CHLORIDE: 102 meq/L (ref 96–112)
CO2: 30 meq/L (ref 19–32)
CREATININE: 1.03 mg/dL (ref 0.40–1.20)
Calcium: 9.4 mg/dL (ref 8.4–10.5)
GFR: 72.41 mL/min (ref >60.00)
Glucose, Bld: 113 mg/dL — ABNORMAL HIGH (ref 70–99)
Potassium: 3.9 mEq/L (ref 3.5–5.1)
Sodium: 139 mEq/L (ref 135–145)

## 2015-01-23 LAB — CBC WITH DIFFERENTIAL/PLATELET
BASOS PCT: 0.3 % (ref 0.0–3.0)
Basophils Absolute: 0 10*3/uL (ref 0.0–0.1)
EOS ABS: 0.1 10*3/uL (ref 0.0–0.7)
Eosinophils Relative: 1.5 % (ref 0.0–5.0)
HEMATOCRIT: 40.6 % (ref 36.0–46.0)
Hemoglobin: 13.4 g/dL (ref 12.0–15.0)
LYMPHS PCT: 31.1 % (ref 12.0–46.0)
Lymphs Abs: 1.7 10*3/uL (ref 0.7–4.0)
MCHC: 32.9 g/dL (ref 30.0–36.0)
MCV: 85.4 fl (ref 78.0–100.0)
MONO ABS: 0.5 10*3/uL (ref 0.1–1.0)
Monocytes Relative: 9.6 % (ref 3.0–12.0)
NEUTROS ABS: 3.1 10*3/uL (ref 1.4–7.7)
Neutrophils Relative %: 57.5 % (ref 43.0–77.0)
PLATELETS: 302 10*3/uL (ref 150.0–400.0)
RBC: 4.75 Mil/uL (ref 3.87–5.11)
RDW: 14 % (ref 11.5–15.5)
WBC: 5.4 10*3/uL (ref 4.0–10.5)

## 2015-01-23 LAB — LIPID PANEL
CHOLESTEROL: 149 mg/dL (ref 0–200)
HDL: 59.2 mg/dL (ref >39.00)
LDL CALC: 69 mg/dL (ref 0–99)
NonHDL: 89.64
TRIGLYCERIDES: 102 mg/dL (ref 0.0–149.0)
Total CHOL/HDL Ratio: 3
VLDL: 20.4 mg/dL (ref 0.0–40.0)

## 2015-01-23 LAB — HEPATIC FUNCTION PANEL
ALT: 15 U/L (ref 0–35)
AST: 17 U/L (ref 0–37)
Albumin: 4.1 g/dL (ref 3.5–5.2)
Alkaline Phosphatase: 83 U/L (ref 39–117)
BILIRUBIN DIRECT: 0.1 mg/dL (ref 0.0–0.3)
TOTAL PROTEIN: 7.9 g/dL (ref 6.0–8.3)
Total Bilirubin: 0.5 mg/dL (ref 0.2–1.2)

## 2015-01-23 LAB — TSH: TSH: 1.88 u[IU]/mL (ref 0.35–4.50)

## 2015-01-27 ENCOUNTER — Encounter: Payer: Self-pay | Admitting: Internal Medicine

## 2015-01-27 ENCOUNTER — Ambulatory Visit (INDEPENDENT_AMBULATORY_CARE_PROVIDER_SITE_OTHER): Payer: No Typology Code available for payment source | Admitting: Internal Medicine

## 2015-01-27 VITALS — BP 136/94 | Temp 98.6°F | Ht 68.5 in | Wt 256.4 lb

## 2015-01-27 DIAGNOSIS — R0683 Snoring: Secondary | ICD-10-CM | POA: Diagnosis not present

## 2015-01-27 DIAGNOSIS — Z23 Encounter for immunization: Secondary | ICD-10-CM

## 2015-01-27 DIAGNOSIS — R7301 Impaired fasting glucose: Secondary | ICD-10-CM

## 2015-01-27 DIAGNOSIS — Z0000 Encounter for general adult medical examination without abnormal findings: Secondary | ICD-10-CM | POA: Diagnosis not present

## 2015-01-27 DIAGNOSIS — G478 Other sleep disorders: Secondary | ICD-10-CM

## 2015-01-27 DIAGNOSIS — G4730 Sleep apnea, unspecified: Secondary | ICD-10-CM

## 2015-01-27 DIAGNOSIS — R030 Elevated blood-pressure reading, without diagnosis of hypertension: Secondary | ICD-10-CM

## 2015-01-27 NOTE — Patient Instructions (Addendum)
Let us know if want referral to nutritionist regarding  Blood  sugar and healthy weight loss.  Healthy weigh tloss will help prevent diabetes and your probable sleep apnea .   Plan yearly blood test and get hg a1c with next labs  ( another blood sugar test)  Make sure bp is below 140/90  Wt Readings from Last 3 Encounters:  01/27/15 256 lb 6.4 oz (116.302 kg)  07/03/14 251 lb 3.2 oz (113.944 kg)  04/30/14 255 lb (115.667 kg)      Health Maintenance Adopting a healthy lifestyle and getting preventive care can go a long way to promote health and wellness. Talk with your health care provider about what schedule of regular examinations is right for you. This is a good chance for you to check in with your provider about disease prevention and staying healthy. In between checkups, there are plenty of things you can do on your own. Experts have done a lot of research about which lifestyle changes and preventive measures are most likely to keep you healthy. Ask your health care provider for more information. WEIGHT AND DIET  Eat a healthy diet  Be sure to include plenty of vegetables, fruits, low-fat dairy products, and lean protein.  Do not eat a lot of foods high in solid fats, added sugars, or salt.  Get regular exercise. This is one of the most important things you can do for your health.  Most adults should exercise for at least 150 minutes each week. The exercise should increase your heart rate and make you sweat (moderate-intensity exercise).  Most adults should also do strengthening exercises at least twice a week. This is in addition to the moderate-intensity exercise.  Maintain a healthy weight  Body mass index (BMI) is a measurement that can be used to identify possible weight problems. It estimates body fat based on height and weight. Your health care provider can help determine your BMI and help you achieve or maintain a healthy weight.  For females 80 years of age and older:    A BMI below 18.5 is considered underweight.  A BMI of 18.5 to 24.9 is normal.  A BMI of 25 to 29.9 is considered overweight.  A BMI of 30 and above is considered obese.  Watch levels of cholesterol and blood lipids  You should start having your blood tested for lipids and cholesterol at 52 years of age, then have this test every 5 years.  You may need to have your cholesterol levels checked more often if:  Your lipid or cholesterol levels are high.  You are older than 52 years of age.  You are at high risk for heart disease.  CANCER SCREENING   Lung Cancer  Lung cancer screening is recommended for adults 24-58 years old who are at high risk for lung cancer because of a history of smoking.  A yearly low-dose CT scan of the lungs is recommended for people who:  Currently smoke.  Have quit within the past 15 years.  Have at least a 30-pack-year history of smoking. A pack year is smoking an average of one pack of cigarettes a day for 1 year.  Yearly screening should continue until it has been 15 years since you quit.  Yearly screening should stop if you develop a health problem that would prevent you from having lung cancer treatment.  Breast Cancer  Practice breast self-awareness. This means understanding how your breasts normally appear and feel.  It also means doing regular  breast self-exams. Let your health care provider know about any changes, no matter how small.  If you are in your 20s or 30s, you should have a clinical breast exam (CBE) by a health care provider every 1-3 years as part of a regular health exam.  If you are 66 or older, have a CBE every year. Also consider having a breast X-ray (mammogram) every year.  If you have a family history of breast cancer, talk to your health care provider about genetic screening.  If you are at high risk for breast cancer, talk to your health care provider about having an MRI and a mammogram every year.  Breast  cancer gene (BRCA) assessment is recommended for women who have family members with BRCA-related cancers. BRCA-related cancers include:  Breast.  Ovarian.  Tubal.  Peritoneal cancers.  Results of the assessment will determine the need for genetic counseling and BRCA1 and BRCA2 testing. Cervical Cancer Routine pelvic examinations to screen for cervical cancer are no longer recommended for nonpregnant women who are considered low risk for cancer of the pelvic organs (ovaries, uterus, and vagina) and who do not have symptoms. A pelvic examination may be necessary if you have symptoms including those associated with pelvic infections. Ask your health care provider if a screening pelvic exam is right for you.   The Pap test is the screening test for cervical cancer for women who are considered at risk.  If you had a hysterectomy for a problem that was not cancer or a condition that could lead to cancer, then you no longer need Pap tests.  If you are older than 65 years, and you have had normal Pap tests for the past 10 years, you no longer need to have Pap tests.  If you have had past treatment for cervical cancer or a condition that could lead to cancer, you need Pap tests and screening for cancer for at least 20 years after your treatment.  If you no longer get a Pap test, assess your risk factors if they change (such as having a new sexual partner). This can affect whether you should start being screened again.  Some women have medical problems that increase their chance of getting cervical cancer. If this is the case for you, your health care provider may recommend more frequent screening and Pap tests.  The human papillomavirus (HPV) test is another test that may be used for cervical cancer screening. The HPV test looks for the virus that can cause cell changes in the cervix. The cells collected during the Pap test can be tested for HPV.  The HPV test can be used to screen women 71 years  of age and older. Getting tested for HPV can extend the interval between normal Pap tests from three to five years.  An HPV test also should be used to screen women of any age who have unclear Pap test results.  After 52 years of age, women should have HPV testing as often as Pap tests.  Colorectal Cancer  This type of cancer can be detected and often prevented.  Routine colorectal cancer screening usually begins at 52 years of age and continues through 52 years of age.  Your health care provider may recommend screening at an earlier age if you have risk factors for colon cancer.  Your health care provider may also recommend using home test kits to check for hidden blood in the stool.  A small camera at the end of a tube can  be used to examine your colon directly (sigmoidoscopy or colonoscopy). This is done to check for the earliest forms of colorectal cancer.  Routine screening usually begins at age 65.  Direct examination of the colon should be repeated every 5-10 years through 52 years of age. However, you may need to be screened more often if early forms of precancerous polyps or small growths are found. Skin Cancer  Check your skin from head to toe regularly.  Tell your health care provider about any new moles or changes in moles, especially if there is a change in a mole's shape or color.  Also tell your health care provider if you have a mole that is larger than the size of a pencil eraser.  Always use sunscreen. Apply sunscreen liberally and repeatedly throughout the day.  Protect yourself by wearing long sleeves, pants, a wide-brimmed hat, and sunglasses whenever you are outside. HEART DISEASE, DIABETES, AND HIGH BLOOD PRESSURE   Have your blood pressure checked at least every 1-2 years. High blood pressure causes heart disease and increases the risk of stroke.  If you are between 56 years and 42 years old, ask your health care provider if you should take aspirin to  prevent strokes.  Have regular diabetes screenings. This involves taking a blood sample to check your fasting blood sugar level.  If you are at a normal weight and have a low risk for diabetes, have this test once every three years after 52 years of age.  If you are overweight and have a high risk for diabetes, consider being tested at a younger age or more often. PREVENTING INFECTION  Hepatitis B  If you have a higher risk for hepatitis B, you should be screened for this virus. You are considered at high risk for hepatitis B if:  You were born in a country where hepatitis B is common. Ask your health care provider which countries are considered high risk.  Your parents were born in a high-risk country, and you have not been immunized against hepatitis B (hepatitis B vaccine).  You have HIV or AIDS.  You use needles to inject street drugs.  You live with someone who has hepatitis B.  You have had sex with someone who has hepatitis B.  You get hemodialysis treatment.  You take certain medicines for conditions, including cancer, organ transplantation, and autoimmune conditions. Hepatitis C  Blood testing is recommended for:  Everyone born from [birth year] through 1963.  Anyone with known risk factors for hepatitis C. Sexually transmitted infections (STIs)  You should be screened for sexually transmitted infections (STIs) including gonorrhea and chlamydia if:  You are sexually active and are younger than 52 years of age.  You are older than 52 years of age and your health care provider tells you that you are at risk for this type of infection.  Your sexual activity has changed since you were last screened and you are at an increased risk for chlamydia or gonorrhea. Ask your health care provider if you are at risk.  If you do not have HIV, but are at risk, it may be recommended that you take a prescription medicine daily to prevent HIV infection. This is called pre-exposure  prophylaxis (PrEP). You are considered at risk if:  You are sexually active and do not regularly use condoms or know the HIV status of your partner(s).  You take drugs by injection.  You are sexually active with a partner who has HIV. Talk with your health  care provider about whether you are at high risk of being infected with HIV. If you choose to begin PrEP, you should first be tested for HIV. You should then be tested every 3 months for as long as you are taking PrEP.  PREGNANCY   If you are premenopausal and you may become pregnant, ask your health care provider about preconception counseling.  If you may become pregnant, take 400 to 800 micrograms (mcg) of folic acid every day.  If you want to prevent pregnancy, talk to your health care provider about birth control (contraception). OSTEOPOROSIS AND MENOPAUSE   Osteoporosis is a disease in which the bones lose minerals and strength with aging. This can result in serious bone fractures. Your risk for osteoporosis can be identified using a bone density scan.  If you are 5 years of age or older, or if you are at risk for osteoporosis and fractures, ask your health care provider if you should be screened.  Ask your health care provider whether you should take a calcium or vitamin D supplement to lower your risk for osteoporosis.  Menopause may have certain physical symptoms and risks.  Hormone replacement therapy may reduce some of these symptoms and risks. Talk to your health care provider about whether hormone replacement therapy is right for you.  HOME CARE INSTRUCTIONS   Schedule regular health, dental, and eye exams.  Stay current with your immunizations.   Do not use any tobacco products including cigarettes, chewing tobacco, or electronic cigarettes.  If you are pregnant, do not drink alcohol.  If you are breastfeeding, limit how much and how often you drink alcohol.  Limit alcohol intake to no more than 1 drink per  day for nonpregnant women. One drink equals 12 ounces of beer, 5 ounces of wine, or 1 ounces of hard liquor.  Do not use street drugs.  Do not share needles.  Ask your health care provider for help if you need support or information about quitting drugs.  Tell your health care provider if you often feel depressed.  Tell your health care provider if you have ever been abused or do not feel safe at home. Document Released: 10/25/2010 Document Revised: 08/26/2013 Document Reviewed: 03/13/2013 [facility] Patient Information 2015 [place], [place]. This information is not intended to replace advice given to you by your health care provider. Make sure you discuss any questions you have with your health care provider.

## 2015-01-27 NOTE — Progress Notes (Signed)
Pre visit review using our clinic review tool, if applicable. No additional management support is needed unless otherwise documented below in the visit note.  Chief Complaint  Patient presents with  . Annual Exam    HPI: Patient  Briana Ferguson  54 y.o. comes in today for Preventive Health Care visit  bp seems ok    Below 140/90  Using  Mouth piece and snoring almost gone away.  More rested.  Hold on sleep study cause of insurance cant say they would pay for it .     Some inc energy  Not falling asleep at work any more .   Health Maintenance  Topic Date Due  . Hepatitis C Screening  1961/02/24  . COLONOSCOPY  03/20/2013  . HIV Screening  01/27/2016 (Originally 03/20/1978)  . INFLUENZA VACCINE  11/24/2015  . PAP SMEAR  01/24/2016  . MAMMOGRAM  03/04/2016  . TETANUS/TDAP  12/25/2016   Health Maintenance Review LIFESTYLE:  Exercise:   Walking at work  Tobacco/ETS: no Alcohol:  no Sugar beverages:  Not as much   Sleep:  Better  hasnt done osa test yet cause of insurance  Drug use: no   ROS:  GEN/ HEENT: No fever, significant weight changes sweats headaches vision problems hearing changes, CV/ PULM; No chest pain shortness of breath cough, syncope,edema  change in exercise tolerance. GI /GU: No adominal pain, vomiting, change in bowel habits. No blood in the stool. No significant GU symptoms. SKIN/HEME: ,no acute skin rashes suspicious lesions or bleeding. No lymphadenopathy, nodules, masses.  NEURO/ PSYCH:  No neurologic signs such as weakness numbness. No depression anxiety. IMM/ Allergy: No unusual infections.  Allergy .   REST of 12 system review negative except as per HPI   Past Medical History  Diagnosis Date  . Environmental allergies   . Attention and concentration deficit 2011    had med trial Dr Candis Schatz ? dx     Past Surgical History  Procedure Laterality Date  . Wisdom tooth extraction      Family History  Problem Relation Age of Onset  . Diabetes  Father   . Stroke Father     deceased  . Hyperlipidemia      fhx  . Cancer      granfather/liver    Social History   Social History  . Marital Status: Married    Spouse Name: N/A  . Number of Children: N/A  . Years of Education: N/A   Occupational History  . Director [employer]    Social History Main Topics  . Smoking status: Never Smoker   . Smokeless tobacco: Never Used  . Alcohol Use: No     Comment: none  . Drug Use: No  . Sexual Activity: Not Asked   Other Topics Concern  . 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    Married    [place]   Of.   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      Has smoke detector and wears seat belts.  No firearms. Stored safely .Sees dentist regularly . No depression       CB x 3    Husband with vascectomy                Outpatient Prescriptions Prior to Visit  Medication Sig Dispense Refill  . Digestive  Enzymes (ENZYME DIGEST PO) Take by mouth as needed.    . fluticasone (FLONASE) 50 MCG/ACT nasal spray 2 sprays each nostril each day 16 g 12  . MAGNESIUM PO Take 2,600 mg by mouth at bedtime.    . Probiotic Product (PROBIOTIC DAILY PO) Take by mouth.    Marland Kitchen OVER THE COUNTER MEDICATION LIPOZENE     No facility-administered medications prior to visit.     EXAM:  BP 136/94 mmHg  Temp(Src) 98.6 F (37 C) (Oral)  Ht 5' 8.5" (1.74 m)  Wt 256 lb 6.4 oz (116.302 kg)  BMI 38.41 kg/m2  Body mass index is 38.41 kg/(m^2).  Physical Exam: Vital signs reviewed [id number] is a well-developed well-nourished alert cooperative    who appearsr stated age in no acute distress.  HEENT: normocephalic atraumatic , Eyes: PERRL EOM's full, conjunctiva clear, Nares: paten,t no deformity discharge or tenderness., Ears: no deformity EAC's clear TMs with normal landmarks. Mouth: clear OP, no lesions, edema.  Moist mucous membranes. Dentition in adequate  repair. NECK: supple without masses, thyromegaly or bruits. CHEST/PULM:  Clear to auscultation and percussion breath sounds equal no wheeze , rales or rhonchi. No chest wall deformities or tenderness.Breast: normal by inspection . No dimpling, discharge, masses, tenderness or discharge . CV: PMI is nondisplaced, S1 S2 no gallops, murmurs, rubs. Peripheral pulses are full without delay.No JVD .  ABDOMEN: Bowel sounds normal nontender  No guard or rebound, no hepato splenomegal no CVA tenderness.  No hernia. Extremtities:  No clubbing cyanosis or edema, no acute joint swelling or redness no focal atrophy NEURO:  Oriented x3, cranial nerves 3-12 appear to be intact, no obvious focal weakness,gait within normal limits no abnormal reflexes or asymmetrical SKIN: No acute rashes normal turgor, color, no bruising or petechiae. PSYCH: Oriented, good eye contact, no obvious depression anxiety, cognition and judgment appear normal. LN: no cervical axillary inguinal adenopathy  Lab Results  Component Value Date   WBC 5.4 01/23/2015   HGB 13.4 01/23/2015   HCT 40.6 01/23/2015   PLT 302.0 01/23/2015   GLUCOSE 113* 01/23/2015   CHOL 149 01/23/2015   TRIG 102.0 01/23/2015   HDL 59.20 01/23/2015   LDLCALC 69 01/23/2015   ALT 15 01/23/2015   AST 17 01/23/2015   NA 139 01/23/2015   K 3.9 01/23/2015   CL 102 01/23/2015   CREATININE 1.03 01/23/2015   BUN 12 01/23/2015   CO2 30 01/23/2015   TSH 1.88 01/23/2015   BP Readings from Last 3 Encounters:  01/27/15 136/94  07/03/14 122/70  04/30/14 140/88    ASSESSMENT AND PLAN:  Discussed the following assessment and plan:  Visit for preventive health examination  Elevated blood pressure reading - get back if over 140/90 or over   Snoring  Fasting hyperglycemia - a1c with next labs to call if wishes nutrition referral  Sleep disorder breathing - seems better on  ot cmouth appliance sounds like osa  consdier other optinos  and follow through lose  weight  Need for prophylactic vaccination and inoculation against influenza - Plan: Flu Vaccine QUAD 36+ mos PF IM (Fluarix & Fluzone Quad PF) Colon  To be done next month .    Patient Care Team: Burnis Medin, MD as PCP - General Ena Dawley, MD (Obstetrics and Gynecology) Lenis Dickinson, MD as Referring Physician (Neurology) Patient Instructions   Let us know if want referral to nutritionist regarding  Blood  sugar and healthy weight loss.  Healthy weigh tloss will  help prevent diabetes and your probable sleep apnea .   Plan yearly blood test and get hg a1c with next labs  ( another blood sugar test)  Make sure bp is below 140/90  Wt Readings from Last 3 Encounters:  01/27/15 256 lb 6.4 oz (116.302 kg)  07/03/14 251 lb 3.2 oz (113.944 kg)  04/30/14 255 lb (115.667 kg)      Health Maintenance Adopting a healthy lifestyle and getting preventive care can go a long way to promote health and wellness. Talk with your health care provider about what schedule of regular examinations is right for you. This is a good chance for you to check in with your provider about disease prevention and staying healthy. In between checkups, there are plenty of things you can do on your own. Experts have done a lot of research about which lifestyle changes and preventive measures are most likely to keep you healthy. Ask your health care provider for more information. WEIGHT AND DIET  Eat a healthy diet  Be sure to include plenty of vegetables, fruits, low-fat dairy products, and lean protein.  Do not eat a lot of foods high in solid fats, added sugars, or salt.  Get regular exercise. This is one of the most important things you can do for your health.  Most adults should exercise for at least 150 minutes each week. The exercise should increase your heart rate and make you sweat (moderate-intensity exercise).  Most adults should also do strengthening exercises at least twice a week. This is in  addition to the moderate-intensity exercise.  Maintain a healthy weight  Body mass index (BMI) is a measurement that can be used to identify possible weight problems. It estimates body fat based on height and weight. Your health care provider can help determine your BMI and help you achieve or maintain a healthy weight.  For females 40 years of age and older:   A BMI below 18.5 is considered underweight.  A BMI of 18.5 to 24.9 is normal.  A BMI of 25 to 29.9 is considered overweight.  A BMI of 30 and above is considered obese.  Watch levels of cholesterol and blood lipids  You should start having your blood tested for lipids and cholesterol at 52 years of age, then have this test every 5 years.  You may need to have your cholesterol levels checked more often if:  Your lipid or cholesterol levels are high.  You are older than 52 years of age.  You are at high risk for heart disease.  CANCER SCREENING   Lung Cancer  Lung cancer screening is recommended for adults 80-17 years old who are at high risk for lung cancer because of a history of smoking.  A yearly low-dose CT scan of the lungs is recommended for people who:  Currently smoke.  Have quit within the past 15 years.  Have at least a 30-pack-year history of smoking. A pack year is smoking an average of one pack of cigarettes a day for 1 year.  Yearly screening should continue until it has been 15 years since you quit.  Yearly screening should stop if you develop a health problem that would prevent you from having lung cancer treatment.  Breast Cancer  Practice breast self-awareness. This means understanding how your breasts normally appear and feel.  It also means doing regular breast self-exams. Let your health care provider know about any changes, no matter how small.  If you are in your 23s  or 30s, you should have a clinical breast exam (CBE) by a health care provider every 1-3 years as part of a regular  health exam.  If you are 26 or older, have a CBE every year. Also consider having a breast X-ray (mammogram) every year.  If you have a family history of breast cancer, talk to your health care provider about genetic screening.  If you are at high risk for breast cancer, talk to your health care provider about having an MRI and a mammogram every year.  Breast cancer gene (BRCA) assessment is recommended for women who have family members with BRCA-related cancers. BRCA-related cancers include:  Breast.  Ovarian.  Tubal.  Peritoneal cancers.  Results of the assessment will determine the need for genetic counseling and BRCA1 and BRCA2 testing. Cervical Cancer Routine pelvic examinations to screen for cervical cancer are no longer recommended for nonpregnant women who are considered low risk for cancer of the pelvic organs (ovaries, uterus, and vagina) and who do not have symptoms. A pelvic examination may be necessary if you have symptoms including those associated with pelvic infections. Ask your health care provider if a screening pelvic exam is right for you.   The Pap test is the screening test for cervical cancer for women who are considered at risk.  If you had a hysterectomy for a problem that was not cancer or a condition that could lead to cancer, then you no longer need Pap tests.  If you are older than 65 years, and you have had normal Pap tests for the past 10 years, you no longer need to have Pap tests.  If you have had past treatment for cervical cancer or a condition that could lead to cancer, you need Pap tests and screening for cancer for at least 20 years after your treatment.  If you no longer get a Pap test, assess your risk factors if they change (such as having a new sexual partner). This can affect whether you should start being screened again.  Some women have medical problems that increase their chance of getting cervical cancer. If this is the case for you, your  health care provider may recommend more frequent screening and Pap tests.  The human papillomavirus (HPV) test is another test that may be used for cervical cancer screening. The HPV test looks for the virus that can cause cell changes in the cervix. The cells collected during the Pap test can be tested for HPV.  The HPV test can be used to screen women 68 years of age and older. Getting tested for HPV can extend the interval between normal Pap tests from three to five years.  An HPV test also should be used to screen women of any age who have unclear Pap test results.  After 52 years of age, women should have HPV testing as often as Pap tests.  Colorectal Cancer  This type of cancer can be detected and often prevented.  Routine colorectal cancer screening usually begins at 52 years of age and continues through 52 years of age.  Your health care provider may recommend screening at an earlier age if you have risk factors for colon cancer.  Your health care provider may also recommend using home test kits to check for hidden blood in the stool.  A small camera at the end of a tube can be used to examine your colon directly (sigmoidoscopy or colonoscopy). This is done to check for the earliest forms of colorectal cancer.  Routine screening usually begins at age 26.  Direct examination of the colon should be repeated every 5-10 years through 52 years of age. However, you may need to be screened more often if early forms of precancerous polyps or small growths are found. Skin Cancer  Check your skin from head to toe regularly.  Tell your health care provider about any new moles or changes in moles, especially if there is a change in a mole's shape or color.  Also tell your health care provider if you have a mole that is larger than the size of a pencil eraser.  Always use sunscreen. Apply sunscreen liberally and repeatedly throughout the day.  Protect yourself by wearing long sleeves,  pants, a wide-brimmed hat, and sunglasses whenever you are outside. HEART DISEASE, DIABETES, AND HIGH BLOOD PRESSURE   Have your blood pressure checked at least every 1-2 years. High blood pressure causes heart disease and increases the risk of stroke.  If you are between 55 years and 8 years old, ask your health care provider if you should take aspirin to prevent strokes.  Have regular diabetes screenings. This involves taking a blood sample to check your fasting blood sugar level.  If you are at a normal weight and have a low risk for diabetes, have this test once every three years after 52 years of age.  If you are overweight and have a high risk for diabetes, consider being tested at a younger age or more often. PREVENTING INFECTION  Hepatitis B  If you have a higher risk for hepatitis B, you should be screened for this virus. You are considered at high risk for hepatitis B if:  You were born in a country where hepatitis B is common. Ask your health care provider which countries are considered high risk.  Your parents were born in a high-risk country, and you have not been immunized against hepatitis B (hepatitis B vaccine).  You have HIV or AIDS.  You use needles to inject street drugs.  You live with someone who has hepatitis B.  You have had sex with someone who has hepatitis B.  You get hemodialysis treatment.  You take certain medicines for conditions, including cancer, organ transplantation, and autoimmune conditions. Hepatitis C  Blood testing is recommended for:  Everyone born from [birth year] through 1963.  Anyone with known risk factors for hepatitis C. Sexually transmitted infections (STIs)  You should be screened for sexually transmitted infections (STIs) including gonorrhea and chlamydia if:  You are sexually active and are younger than 52 years of age.  You are older than 52 years of age and your health care provider tells you that you are at risk for this  type of infection.  Your sexual activity has changed since you were last screened and you are at an increased risk for chlamydia or gonorrhea. Ask your health care provider if you are at risk.  If you do not have HIV, but are at risk, it may be recommended that you take a prescription medicine daily to prevent HIV infection. This is called pre-exposure prophylaxis (PrEP). You are considered at risk if:  You are sexually active and do not regularly use condoms or know the HIV status of your partner(s).  You take drugs by injection.  You are sexually active with a partner who has HIV. Talk with your health care provider about whether you are at high risk of being infected with HIV. If you choose to begin PrEP, you should first  be tested for HIV. You should then be tested every 3 months for as long as you are taking PrEP.  PREGNANCY   If you are premenopausal and you may become pregnant, ask your health care provider about preconception counseling.  If you may become pregnant, take 400 to 800 micrograms (mcg) of folic acid every day.  If you want to prevent pregnancy, talk to your health care provider about birth control (contraception). OSTEOPOROSIS AND MENOPAUSE   Osteoporosis is a disease in which the bones lose minerals and strength with aging. This can result in serious bone fractures. Your risk for osteoporosis can be identified using a bone density scan.  If you are 70 years of age or older, or if you are at risk for osteoporosis and fractures, ask your health care provider if you should be screened.  Ask your health care provider whether you should take a calcium or vitamin D supplement to lower your risk for osteoporosis.  Menopause may have certain physical symptoms and risks.  Hormone replacement therapy may reduce some of these symptoms and risks. Talk to your health care provider about whether hormone replacement therapy is right for you.  HOME CARE INSTRUCTIONS   Schedule  regular health, dental, and eye exams.  Stay current with your immunizations.   Do not use any tobacco products including cigarettes, chewing tobacco, or electronic cigarettes.  If you are pregnant, do not drink alcohol.  If you are breastfeeding, limit how much and how often you drink alcohol.  Limit alcohol intake to no more than 1 drink per day for nonpregnant women. One drink equals 12 ounces of beer, 5 ounces of wine, or 1 ounces of hard liquor.  Do not use street drugs.  Do not share needles.  Ask your health care provider for help if you need support or information about quitting drugs.  Tell your health care provider if you often feel depressed.  Tell your health care provider if you have ever been abused or do not feel safe at home. Document Released: 10/25/2010 Document Revised: 08/26/2013 Document Reviewed: 03/13/2013 [facility] Patient Information 2015 [place], [place]. This information is not intended to replace advice given to you by your health care provider. Make sure you discuss any questions you have with your health care provider.     Standley Brooking. Juana Haralson M.D.

## 2015-02-02 ENCOUNTER — Telehealth: Payer: Self-pay | Admitting: *Deleted

## 2015-02-02 NOTE — Telephone Encounter (Signed)
Pt no show for previsit appointment 02/02/15. Message left on cell phone to reschedule PV today by 5pm or colon scheduled 02/19/15 will be cancelled and both appointments would need to be rescheduled. Reached patient at home number, PV rescheduled to 900 am 02/03/15.

## 2015-02-03 ENCOUNTER — Ambulatory Visit (AMBULATORY_SURGERY_CENTER): Payer: Self-pay

## 2015-02-03 VITALS — Ht 68.0 in | Wt 256.2 lb

## 2015-02-03 DIAGNOSIS — Z1211 Encounter for screening for malignant neoplasm of colon: Secondary | ICD-10-CM

## 2015-02-03 NOTE — Progress Notes (Signed)
Per pt, no allergies to soy or egg products.Pt not taking any weight loss meds or using  O2 at home. 

## 2015-02-18 ENCOUNTER — Other Ambulatory Visit: Payer: Self-pay

## 2015-02-18 DIAGNOSIS — Z1231 Encounter for screening mammogram for malignant neoplasm of breast: Secondary | ICD-10-CM

## 2015-02-19 ENCOUNTER — Ambulatory Visit (AMBULATORY_SURGERY_CENTER): Payer: No Typology Code available for payment source | Admitting: Internal Medicine

## 2015-02-19 ENCOUNTER — Encounter: Payer: Self-pay | Admitting: Internal Medicine

## 2015-02-19 VITALS — BP 143/74 | HR 76 | Temp 96.2°F | Resp 37 | Ht 68.0 in | Wt 256.0 lb

## 2015-02-19 DIAGNOSIS — D123 Benign neoplasm of transverse colon: Secondary | ICD-10-CM | POA: Diagnosis not present

## 2015-02-19 DIAGNOSIS — Z1211 Encounter for screening for malignant neoplasm of colon: Secondary | ICD-10-CM

## 2015-02-19 DIAGNOSIS — D122 Benign neoplasm of ascending colon: Secondary | ICD-10-CM

## 2015-02-19 DIAGNOSIS — K635 Polyp of colon: Secondary | ICD-10-CM

## 2015-02-19 MED ORDER — SODIUM CHLORIDE 0.9 % IV SOLN: 500.0000 mL | INTRAVENOUS | Status: DC

## 2015-02-19 NOTE — Progress Notes (Signed)
Transferred to recovery room. A/O x3, pleased with MAC.  VSS.  Report to Annette, RN. 

## 2015-02-19 NOTE — Progress Notes (Signed)
No problems noted in the recovery room. maw 

## 2015-02-19 NOTE — Progress Notes (Signed)
Called to room to assist during endoscopic procedure.  Patient ID and intended procedure confirmed with present staff. Received instructions for my participation in the procedure from the performing physician.  

## 2015-02-19 NOTE — Patient Instructions (Signed)
YOU HAD AN ENDOSCOPIC PROCEDURE TODAY AT THE [facility]:   Refer to the procedure report that was given to you for any specific questions about what was found during the examination.  If the procedure report does not answer your questions, please call your gastroenterologist to clarify.  If you requested that your care partner not be given the details of your procedure findings, then the procedure report has been included in a sealed envelope for you to review at your convenience later.  YOU SHOULD EXPECT: Some feelings of bloating in the abdomen. Passage of more gas than usual.  Walking can help get rid of the air that was put into your GI tract during the procedure and reduce the bloating. If you had a lower endoscopy (such as a colonoscopy or flexible sigmoidoscopy) you may notice spotting of blood in your stool or on the toilet paper. If you underwent a bowel prep for your procedure, you may not have a normal bowel movement for a few days.  Please Note:  You might notice some irritation and congestion in your nose or some drainage.  This is from the oxygen used during your procedure.  There is no need for concern and it should clear up in a day or so.  SYMPTOMS TO REPORT IMMEDIATELY:   Following lower endoscopy (colonoscopy or flexible sigmoidoscopy):  Excessive amounts of blood in the stool  Significant tenderness or worsening of abdominal pains  Swelling of the abdomen that is new, acute  Fever of 100F or higher   For urgent or emergent issues, a gastroenterologist can be reached at any hour by calling [phone].   DIET: Your first meal following the procedure should be a small meal and then it is ok to progress to your normal diet. Heavy or fried foods are harder to digest and may make you feel nauseous or bloated.  Likewise, meals heavy in dairy and vegetables can increase bloating.  Drink plenty of fluids but you should avoid alcoholic beverages for 24  hours.  ACTIVITY:  You should plan to take it easy for the rest of today and you should NOT DRIVE or use heavy machinery until tomorrow (because of the sedation medicines used during the test).    FOLLOW UP: Our staff will call the number listed on your records the next business day following your procedure to check on you and address any questions or concerns that you may have regarding the information given to you following your procedure. If we do not reach you, we will leave a message.  However, if you are feeling well and you are not experiencing any problems, there is no need to return our call.  We will assume that you have returned to your regular daily activities without incident.  If any biopsies were taken you will be contacted by phone or by letter within the next 1-3 weeks.  Please call us at [phone] if you have not heard about the biopsies in 3 weeks.    SIGNATURES/CONFIDENTIALITY: You and/or your care partner have signed paperwork which will be entered into your electronic medical record.  These signatures attest to the fact that that the information above on your After Visit Summary has been reviewed and is understood.  Full responsibility of the confidentiality of this discharge information lies with you and/or your care-partner.     Handout was given to your care partner on polyps. You may resume your current medications today. Await biopsy results. Please call   if any questions or concerns.   

## 2015-02-19 NOTE — Op Note (Signed)
[place]  [employer]. [place], [address]   COLONOSCOPY PROCEDURE REPORT  PATIENT: Briana Ferguson, Briana Ferguson  MR#: [id number] BIRTHDATE: 1961-02-21 , 53  yrs. old GENDER: female ENDOSCOPIST: Jerene Bears, MD REFERRED [id number] Darnelle Going, M.D. PROCEDURE DATE:  02/19/2015 PROCEDURE:   Colonoscopy, screening, Colonoscopy with cold biopsy polypectomy, and Colonoscopy with snare polypectomy First Screening Colonoscopy - Avg.  risk and is 50 yrs.  old or older Yes.  Prior Negative Screening - Now for repeat screening. N/A  History of Adenoma - Now for follow-up colonoscopy & has been > or = to 3 yrs.  N/A  Polyps removed today? Yes ASA CLASS:   Class II INDICATIONS:Screening for colonic neoplasia, Colorectal Neoplasm Risk Assessment for this procedure is average risk, and 1st colonoscopy. MEDICATIONS: Monitored anesthesia care and Propofol 250 mg IV  DESCRIPTION OF PROCEDURE:   After the risks benefits and alternatives of the procedure were thoroughly explained, informed consent was obtained.  The digital rectal exam revealed no rectal mass.   The LB 1528  endoscope was introduced through the anus and advanced to the cecum, which was identified by both the appendix and ileocecal valve. No adverse events experienced.   The quality of the prep was good.  (MiraLax was used)  The instrument was then slowly withdrawn as the colon was fully examined. Estimated blood loss is zero unless otherwise noted in this procedure report.   COLON FINDINGS: Two sessile polyps ranging between 3-32mm in size were found in the ascending colon and at the hepatic flexure. Polypectomies were performed with cold forceps and with a cold snare.  The resection was complete, the polyp tissue was completely retrieved and sent to histology.   The examination was otherwise normal.  Retroflexed views revealed no abnormalities (image taken but not captured). The time to cecum = 2.4 Withdrawal time = 14.5 The  scope was withdrawn and the procedure completed. COMPLICATIONS: There were no immediate complications.  ENDOSCOPIC IMPRESSION: 1.   Two sessile polyps ranging between 3-37mm in size were found in the ascending colon and at the hepatic flexure; polypectomies were performed with cold forceps and with a cold snare 2.   The examination was otherwise normal  RECOMMENDATIONS: 1.  Await pathology results 2.  If the polyps removed today are proven to be adenomatous (pre-cancerous) polyps, you will need a repeat colonoscopy in 5 years.  Otherwise you should continue to follow colorectal cancer screening guidelines for "routine risk" patients with colonoscopy in 10 years.  You will receive a letter within 1-2 weeks with the results of your biopsy as well as final recommendations.  Please call my office if you have not received a letter after 3 weeks.  eSigned:  Jerene Bears, MD 02/19/2015 10:02 AM cc: Burnis Medin, MD and The Patient

## 2015-02-20 ENCOUNTER — Telehealth: Payer: Self-pay | Admitting: *Deleted

## 2015-02-20 NOTE — Telephone Encounter (Signed)
  Follow up Call-  Call back number 02/19/2015  Post procedure Call Back phone  # [phone]  Permission to leave phone message Yes     Patient questions:  Message left to call if necessary.

## 2015-02-25 ENCOUNTER — Encounter: Payer: Self-pay | Admitting: Internal Medicine

## 2015-03-11 ENCOUNTER — Ambulatory Visit: Payer: Self-pay

## 2015-12-22 ENCOUNTER — Encounter (HOSPITAL_COMMUNITY): Payer: Self-pay | Admitting: Obstetrics and Gynecology

## 2016-01-05 ENCOUNTER — Ambulatory Visit (INDEPENDENT_AMBULATORY_CARE_PROVIDER_SITE_OTHER)
Admission: RE | Admit: 2016-01-05 | Discharge: 2016-01-05 | Disposition: A | Discharge status: Home / Self Care | Payer: No Typology Code available for payment source | Source: Ambulatory Visit | Attending: Internal Medicine | Admitting: Internal Medicine

## 2016-01-05 ENCOUNTER — Encounter: Payer: Self-pay | Admitting: Internal Medicine

## 2016-01-05 ENCOUNTER — Ambulatory Visit (INDEPENDENT_AMBULATORY_CARE_PROVIDER_SITE_OTHER): Payer: No Typology Code available for payment source | Admitting: Internal Medicine

## 2016-01-05 ENCOUNTER — Other Ambulatory Visit: Payer: Self-pay | Admitting: Obstetrics and Gynecology

## 2016-01-05 VITALS — BP 114/72 | Temp 98.7°F | Wt 247.4 lb

## 2016-01-05 DIAGNOSIS — Z23 Encounter for immunization: Secondary | ICD-10-CM

## 2016-01-05 DIAGNOSIS — R05 Cough: Secondary | ICD-10-CM

## 2016-01-05 DIAGNOSIS — G5603 Carpal tunnel syndrome, bilateral upper limbs: Secondary | ICD-10-CM

## 2016-01-05 DIAGNOSIS — M79645 Pain in left finger(s): Secondary | ICD-10-CM

## 2016-01-05 DIAGNOSIS — R053 Chronic cough: Secondary | ICD-10-CM

## 2016-01-05 DIAGNOSIS — N63 Unspecified lump in unspecified breast: Secondary | ICD-10-CM

## 2016-01-05 DIAGNOSIS — R059 Cough, unspecified: Secondary | ICD-10-CM

## 2016-01-05 MED ORDER — HYDROCODONE-HOMATROPINE 5-1.5 MG/5ML PO SYRP: ORAL_SOLUTION | ORAL | 0 refills | Status: DC

## 2016-01-05 NOTE — Patient Instructions (Addendum)
Your exam is reassuring today in your cough is probably just consecutive respiratory infections from the exposure to children. Most cough last 2-3 weeks occasionally more until the lungs heal.   Get chest x ray   But expect  To be normal .  There are other causes of cough that are usually allergy postnasal drainage and reflux. Antibiotics only help bacterial infection such as pneumonia or persistent sinus infection. I don't see evidence of today.  Avoid excess lifting with the left hand use arm and not fingers if finger is still painful getting stuck like a trigger finger would have you see a hand specialist or hand surgeon. Contact us if needed for referral.  I      Acute Bronchitis Bronchitis is inflammation of the airways that extend from the windpipe into the lungs (bronchi). The inflammation often causes mucus to develop. This leads to a cough, which is the most common symptom of bronchitis.  In acute bronchitis, the condition usually develops suddenly and goes away over time, usually in a couple weeks. Smoking, allergies, and asthma can make bronchitis worse. Repeated episodes of bronchitis may cause further lung problems.  CAUSES Acute bronchitis is most often caused by the same virus that causes a cold. The virus can spread from person to person (contagious) through coughing, sneezing, and touching contaminated objects. SIGNS AND SYMPTOMS   Cough.   Fever.   Coughing up mucus.   Body aches.   Chest congestion.   Chills.   Shortness of breath.   Sore throat.  DIAGNOSIS  Acute bronchitis is usually diagnosed through a physical exam. Your health care provider will also ask you questions about your medical history. Tests, such as chest X-rays, are sometimes done to rule out other conditions.  TREATMENT  Acute bronchitis usually goes away in a couple weeks. Oftentimes, no medical treatment is necessary. Medicines are sometimes given for relief of fever or cough.  Antibiotic medicines are usually not needed but may be prescribed in certain situations. In some cases, an inhaler may be recommended to help reduce shortness of breath and control the cough. A cool mist vaporizer may also be used to help thin bronchial secretions and make it easier to clear the chest.  HOME CARE INSTRUCTIONS  Get plenty of rest.   Drink enough fluids to keep your urine clear or pale yellow (unless you have a medical condition that requires fluid restriction). Increasing fluids may help thin your respiratory secretions (sputum) and reduce chest congestion, and it will prevent dehydration.   Take medicines only as directed by your health care provider.  If you were prescribed an antibiotic medicine, finish it all even if you start to feel better.  Avoid smoking and secondhand smoke. Exposure to cigarette smoke or irritating chemicals will make bronchitis worse. If you are a smoker, consider using nicotine gum or skin patches to help control withdrawal symptoms. Quitting smoking will help your lungs heal faster.   Reduce the chances of another bout of acute bronchitis by washing your hands frequently, avoiding people with cold symptoms, and trying not to touch your hands to your mouth, nose, or eyes.   Keep all follow-up visits as directed by your health care provider.  SEEK MEDICAL CARE IF: Your symptoms do not improve after 1 week of treatment.  SEEK IMMEDIATE MEDICAL CARE IF:  You develop an increased fever or chills.   You have chest pain.   You have severe shortness of breath.  You have bloody sputum.  You develop dehydration.  You faint or repeatedly feel like you are going to pass out.  You develop repeated vomiting.  You develop a severe headache. MAKE SURE YOU:   Understand these instructions.  Will watch your condition.  Will get help right away if you are not doing well or get worse.   This information is not intended to replace advice  given to you by your health care provider. Make sure you discuss any questions you have with your health care provider.   Document Released: 05/19/2004 Document Revised: 05/02/2014 Document Reviewed: 10/02/2012 Elsevier Interactive Patient Education [employer].

## 2016-01-05 NOTE — Progress Notes (Signed)
Pre visit review using our clinic review tool, if applicable. No additional management support is needed unless otherwise documented below in the visit note.  Chief Complaint  Patient presents with  . Cough    On and off for several months.  Continues to have tingling and numbness in hands    HPI: Briana Ferguson 55 y.o.  Couple of issues   Cough  Moist persistent   For stop for a while and comes back .Cough all night lin when happens    And when called  Was worse .   Still coughing up phlegm . Has tried about 6  Months   Ago and last 2-3 weeks.  Onset  Like  tickle cough and  Then progresses.  And  Took otc for a while  ? If fever at some point .  Tired from coughing all night .  No smoking  Exposed to coughing   Person ...and nex week had sx . But didn't .  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  Getting better . No asthma or chornic cough and no tobacco   hsa bilateral carpal tunnel  but left middle finger cant close   Middle finger at times pain an stiffness and pops ? Trigger   Is right handed  Has a sop on screening mammo and to get diag today    ROS: See pertinent positives and negatives per HPI. No cp sob  Hemoptysis   Past Medical History:  Diagnosis Date  . Attention and concentration deficit 2011   had med trial Dr Candis Schatz ? dx   . Environmental allergies     Family History  Problem Relation Age of Onset  . Diabetes Father   . Stroke Father     deceased  . Heart disease Father   . Diabetes Mother   . Stroke Mother   . Hyperlipidemia      fhx  . Cancer      granfather/liver    Social History   Social History  . Marital status: Married    Spouse name: N/A  . Number of children: N/A  . Years of education: N/A   Occupational History  . Director [employer]   Social History Main Topics  . Smoking status: Never Smoker  . Smokeless tobacco: Never Used  . Alcohol use No     Comment: none  . Drug use: No  .  Sexual activity: Not Asked   Other Topics Concern  . 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    Married    [place]   Of.   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      Has smoke detector and wears seat belts.  No firearms. Stored safely .Sees dentist regularly . No depression       CB x 3    Husband with vascectomy                Outpatient Medications Prior to Visit  Medication Sig Dispense Refill  . fluticasone (FLONASE) 50 MCG/ACT nasal spray 2 sprays each nostril each day (Patient taking differently: 2 sprays each nostril as needed) 16 g 12  . MAGNESIUM PO Take 2,600 mg by mouth as needed.     . Digestive Enzymes (ENZYME DIGEST PO) Take by  mouth as needed.    Marland Kitchen OVER THE COUNTER MEDICATION     . Probiotic Product (PROBIOTIC DAILY PO) Take by mouth as needed.      No facility-administered medications prior to visit.      EXAM:  BP 114/72 (BP Location: Left Arm, Patient Position: Sitting, Cuff Size: Large)   Temp 98.7 F (37.1 C) (Oral)   Wt 247 lb 6.4 oz (112.2 kg)   BMI 37.62 kg/m   Body mass index is 37.62 kg/m.  GENERAL: vitals reviewed and listed above, alert, oriented, appears well hydrated and in no acute distress HEENT: atraumatic, conjunctiva  clear, no obvious abnormalities on inspection of external nose and ears tm ok min congestion  Nares mild congestion OP : no lesion edema or exudate  NECK: no obvious masses on inspection palpation  LUNGS: clear to auscultation bilaterally, no wheezes, rales or rhonchi, good air movement CV: HRRR, no clubbing cyanosis or  peripheral edema nl cap refill  MS: moves all extremities without noticeable focal  Abnormality left hand dec rom fist nl strength  No pop middle finger but  Tender along flexor tendon sheath  No atrophy grip 5/5 PSYCH: pleasant and cooperative, no obvious depression or anxiety   ASSESSMENT AND PLAN:  Discussed the  following assessment and plan:  Cough - Plan: DG Chest 2 View  Cough, persistent - Plan: DG Chest 2 View  Pain of finger of left hand - poss trigger finger  on top of cts controlled  avoid excess lifting etc. if continues call for hand referral.  Bilateral carpal tunnel syndrome - eval in past  hand person in [place] Total visit 20mins > 50% spent counseling and coordinating care as indicated in above note and in instructions to patient .   appears that the cough may be recurrent respiratory infection that resolved totally in between. She does have possibly some postnasal drainage can use cough medicine for comfort Flonase if needed for postnasal drainage also. Follow-up with alarm symptoms or persistent progressive his lungs are chest x-rays normal. -Patient advised to return or notify health care team  if symptoms worsen ,persist or new concerns arise.  Patient Instructions  Your exam is reassuring today in your cough is probably just consecutive respiratory infections from the exposure to children. Most cough last 2-3 weeks occasionally more until the lungs heal.   Get chest x ray   But expect  To be normal .  There are other causes of cough that are usually allergy postnasal drainage and reflux. Antibiotics only help bacterial infection such as pneumonia or persistent sinus infection. I don't see evidence of today.  Avoid excess lifting with the left hand use arm and not fingers if finger is still painful getting stuck like a trigger finger would have you see a hand specialist or hand surgeon. Contact us if needed for referral.  I      Acute Bronchitis Bronchitis is inflammation of the airways that extend from the windpipe into the lungs (bronchi). The inflammation often causes mucus to develop. This leads to a cough, which is the most common symptom of bronchitis.  In acute bronchitis, the condition usually develops suddenly and goes away over time, usually in a couple weeks. Smoking,  allergies, and asthma can make bronchitis worse. Repeated episodes of bronchitis may cause further lung problems.  CAUSES Acute bronchitis is most often caused by the same virus that causes a cold. The virus can spread from person to person (contagious) through coughing,  sneezing, and touching contaminated objects. SIGNS AND SYMPTOMS   Cough.   Fever.   Coughing up mucus.   Body aches.   Chest congestion.   Chills.   Shortness of breath.   Sore throat.  DIAGNOSIS  Acute bronchitis is usually diagnosed through a physical exam. Your health care provider will also ask you questions about your medical history. Tests, such as chest X-rays, are sometimes done to rule out other conditions.  TREATMENT  Acute bronchitis usually goes away in a couple weeks. Oftentimes, no medical treatment is necessary. Medicines are sometimes given for relief of fever or cough. Antibiotic medicines are usually not needed but may be prescribed in certain situations. In some cases, an inhaler may be recommended to help reduce shortness of breath and control the cough. A cool mist vaporizer may also be used to help thin bronchial secretions and make it easier to clear the chest.  HOME CARE INSTRUCTIONS  Get plenty of rest.   Drink enough fluids to keep your urine clear or pale yellow (unless you have a medical condition that requires fluid restriction). Increasing fluids may help thin your respiratory secretions (sputum) and reduce chest congestion, and it will prevent dehydration.   Take medicines only as directed by your health care provider.  If you were prescribed an antibiotic medicine, finish it all even if you start to feel better.  Avoid smoking and secondhand smoke. Exposure to cigarette smoke or irritating chemicals will make bronchitis worse. If you are a smoker, consider using nicotine gum or skin patches to help control withdrawal symptoms. Quitting smoking will help your lungs heal  faster.   Reduce the chances of another bout of acute bronchitis by washing your hands frequently, avoiding people with cold symptoms, and trying not to touch your hands to your mouth, nose, or eyes.   Keep all follow-up visits as directed by your health care provider.  SEEK MEDICAL CARE IF: Your symptoms do not improve after 1 week of treatment.  SEEK IMMEDIATE MEDICAL CARE IF:  You develop an increased fever or chills.   You have chest pain.   You have severe shortness of breath.  You have bloody sputum.   You develop dehydration.  You faint or repeatedly feel like you are going to pass out.  You develop repeated vomiting.  You develop a severe headache. MAKE SURE YOU:   Understand these instructions.  Will watch your condition.  Will get help right away if you are not doing well or get worse.   This information is not intended to replace advice given to you by your health care provider. Make sure you discuss any questions you have with your health care provider.   Document Released: 05/19/2004 Document Revised: 05/02/2014 Document Reviewed: 10/02/2012 Elsevier Interactive Patient Education 2016 Harcourt K. Panosh M.D.

## 2016-01-11 ENCOUNTER — Ambulatory Visit
Admission: RE | Admit: 2016-01-11 | Discharge: 2016-01-11 | Disposition: A | Discharge status: Home / Self Care | Payer: No Typology Code available for payment source | Source: Ambulatory Visit | Attending: Obstetrics and Gynecology | Admitting: Obstetrics and Gynecology

## 2016-01-11 DIAGNOSIS — N63 Unspecified lump in unspecified breast: Secondary | ICD-10-CM

## 2017-01-06 ENCOUNTER — Encounter: Payer: No Typology Code available for payment source | Admitting: Internal Medicine

## 2017-01-13 ENCOUNTER — Encounter: Payer: Self-pay | Admitting: Internal Medicine

## 2017-02-03 ENCOUNTER — Ambulatory Visit (INDEPENDENT_AMBULATORY_CARE_PROVIDER_SITE_OTHER): Payer: BC Managed Care – PPO | Admitting: Internal Medicine

## 2017-02-03 ENCOUNTER — Encounter: Payer: Self-pay | Admitting: Internal Medicine

## 2017-02-03 VITALS — BP 120/88 | HR 76 | Temp 98.3°F | Ht 68.0 in | Wt 256.0 lb

## 2017-02-03 DIAGNOSIS — Z23 Encounter for immunization: Secondary | ICD-10-CM

## 2017-02-03 DIAGNOSIS — Z0000 Encounter for general adult medical examination without abnormal findings: Secondary | ICD-10-CM

## 2017-02-03 DIAGNOSIS — Z13220 Encounter for screening for lipoid disorders: Secondary | ICD-10-CM | POA: Diagnosis not present

## 2017-02-03 DIAGNOSIS — Z6838 Body mass index (BMI) 38.0-38.9, adult: Secondary | ICD-10-CM | POA: Diagnosis not present

## 2017-02-03 DIAGNOSIS — Z1159 Encounter for screening for other viral diseases: Secondary | ICD-10-CM

## 2017-02-03 LAB — HEPATIC FUNCTION PANEL
ALBUMIN: 4.2 g/dL (ref 3.5–5.2)
ALT: 14 U/L (ref 0–35)
AST: 15 U/L (ref 0–37)
Alkaline Phosphatase: 77 U/L (ref 39–117)
BILIRUBIN TOTAL: 0.5 mg/dL (ref 0.2–1.2)
Bilirubin, Direct: 0.1 mg/dL (ref 0.0–0.3)
Total Protein: 7.4 g/dL (ref 6.0–8.3)

## 2017-02-03 LAB — CBC WITH DIFFERENTIAL/PLATELET
BASOS PCT: 0.3 % (ref 0.0–3.0)
Basophils Absolute: 0 10*3/uL (ref 0.0–0.1)
EOS PCT: 1.1 % (ref 0.0–5.0)
Eosinophils Absolute: 0 10*3/uL (ref 0.0–0.7)
HCT: 41.1 % (ref 36.0–46.0)
Hemoglobin: 13.4 g/dL (ref 12.0–15.0)
LYMPHS ABS: 1.7 10*3/uL (ref 0.7–4.0)
Lymphocytes Relative: 40.2 % (ref 12.0–46.0)
MCHC: 32.5 g/dL (ref 30.0–36.0)
MCV: 88.1 fl (ref 78.0–100.0)
MONO ABS: 0.5 10*3/uL (ref 0.1–1.0)
MONOS PCT: 10.8 % (ref 3.0–12.0)
NEUTROS ABS: 2 10*3/uL (ref 1.4–7.7)
NEUTROS PCT: 47.6 % (ref 43.0–77.0)
PLATELETS: 315 10*3/uL (ref 150.0–400.0)
RBC: 4.67 Mil/uL (ref 3.87–5.11)
RDW: 14 % (ref 11.5–15.5)
WBC: 4.3 10*3/uL (ref 4.0–10.5)

## 2017-02-03 LAB — LIPID PANEL
CHOLESTEROL: 180 mg/dL (ref 0–200)
HDL: 60.2 mg/dL (ref >39.00)
LDL CALC: 94 mg/dL (ref 0–99)
NonHDL: 119.43
TRIGLYCERIDES: 125 mg/dL (ref 0.0–149.0)
Total CHOL/HDL Ratio: 3
VLDL: 25 mg/dL (ref 0.0–40.0)

## 2017-02-03 LAB — BASIC METABOLIC PANEL
BUN: 10 mg/dL (ref 6–23)
CHLORIDE: 100 meq/L (ref 96–112)
CO2: 32 meq/L (ref 19–32)
Calcium: 9 mg/dL (ref 8.4–10.5)
Creatinine, Ser: 1 mg/dL (ref 0.40–1.20)
GFR: 74.34 mL/min (ref >60.00)
GLUCOSE: 70 mg/dL (ref 70–99)
POTASSIUM: 4.1 meq/L (ref 3.5–5.1)
SODIUM: 139 meq/L (ref 135–145)

## 2017-02-03 LAB — TSH: TSH: 1.46 u[IU]/mL (ref 0.35–4.50)

## 2017-02-03 MED ORDER — FLUTICASONE PROPIONATE 50 MCG/ACT NA SUSP: NASAL | 12 refills | Status: DC

## 2017-02-03 NOTE — Progress Notes (Signed)
Chief Complaint  Patient presents with  . Annual Exam    HPI: Patient  Briana Ferguson  56 y.o. comes in today for Preventive Health Care visit  Now wokring at home some less stress.  New insurance and will proceed with osa  eval ( fam hx)  Please refill flonase  Health Maintenance  Topic Date Due  . Hepatitis C Screening  [birth year]  . HIV Screening  03/20/1978  . MAMMOGRAM  03/04/2016  . PAP SMEAR  12/22/2018  . COLONOSCOPY  02/19/2020  . TETANUS/TDAP  02/04/2027  . 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 GI /GU: No adominal pain, vomiting,  Mild change in bowel habits. No cons no vomit but some lgi sx no bleood  No blood in the stool.  On 5 y colon and utdNo significant GU symptoms. utd SKIN/HEME: ,no acute skin rashes suspicious lesions or bleeding. No lymphadenopathy, nodules, masses.  NEURO/ PSYCH:  No neurologic signs such as weakness numbness. No depression anxiety. IMM/ Allergy: No unusual infections.  Allergy .   REST of 12 system review negative except as per HPI   Past Medical History:  Diagnosis Date  . Attention and concentration deficit 2011   had med trial Dr Candis Schatz ? dx   . Environmental allergies     Past Surgical History:  Procedure Laterality Date  . WISDOM TOOTH EXTRACTION      Family History  Problem Relation Age of Onset  . Diabetes Father   . Stroke Father        deceased  . Heart disease Father   . Diabetes Mother   . Stroke Mother   . Hyperlipidemia Unknown        fhx  . Cancer Unknown        granfather/liver    Social History   Social History  . Marital status: Married    Spouse name: N/A  . Number of children: N/A  . Years of education: N/A   Occupational History  . Director [employer]   Social History Main Topics  . Smoking status: Never Smoker  . Smokeless tobacco: Never Used  . Alcohol use No     Comment: none  . Drug use: No  . Sexual activity: Not Asked   Other Topics Concern  . 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    Married    [place]   Of.   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      Has smoke detector and wears seat belts.  No firearms. Stored safely .Sees dentist regularly . No depression       CB x 3    Husband with vascectomy  Outpatient Medications Prior to Visit  Medication Sig Dispense Refill  . MAGNESIUM PO Take 2,600 mg by mouth as needed.     . fluticasone (FLONASE) 50 MCG/ACT nasal spray 2 sprays each nostril each day (Patient taking differently: 2 sprays each nostril as needed) 16 g 12  . HYDROcodone-homatropine (HYCODAN) 5-1.5 MG/5ML syrup 1 tsp at night or every 4-6 hours if needed for cough (Patient not taking: Reported on 02/03/2017) 180 mL 0   No facility-administered medications prior to visit.      EXAM:  BP 120/88 (BP Location: Left Arm, Patient Position: Sitting, Cuff Size: Normal)   Pulse 76   Temp 98.3 F (36.8 C) (Oral)   Ht '5\' 8"'$  (1.727 m)   Wt 256 lb (116.1 kg)   SpO2 98%   BMI 38.92 kg/m   Body mass index is 38.92 kg/m. Wt Readings from Last 3 Encounters:  02/03/17 256 lb (116.1 kg)  01/05/16 247 lb 6.4 oz (112.2 kg)  02/19/15 256 lb (116.1 kg)    Physical Exam: Vital signs reviewed JKD:TOIZ is a well-developed well-nourished alert cooperative    who appearsr stated age in no acute distress.  HEENT: normocephalic atraumatic , Eyes: PERRL  EOM's full, conjunctiva clear, Nares: paten,t no deformity discharge or tenderness., Ears: no deformity EAC's clear TMs with normal landmarks. Mouth: clear OP, no lesions, edema.  Moist mucous membranes. Dentition in adequate repair. NECK: supple without masses,  or bruits. palpable thyroid  CHEST/PULM:  Clear to auscultation and percussion breath sounds equal no wheeze , rales or rhonchi. No chest wall deformities or tenderness. Breast: normal by inspection . No dimpling, discharge, masses, tenderness or discharge . CV: PMI is nondisplaced, S1 S2 no gallops, murmurs, rubs. Peripheral pulses are full without delay.No JVD .  ABDOMEN: Bowel sounds normal nontender  No guard or rebound, no hepato splenomegal no CVA tenderness.  No hernia. Extremtities:  No clubbing cyanosis or edema, no acute joint swelling or redness no focal atrophy NEURO:  Oriented x3, cranial nerves 3-12 appear to be intact, no obvious focal weakness,gait within normal limits no abnormal reflexes or asymmetrical SKIN: No acute rashes normal turgor, color, no bruising or petechiae. PSYCH: Oriented, good eye contact, no obvious depression anxiety, cognition and judgment appear normal. LN: no cervical axillary inguinal adenopathy    BP Readings from Last 3 Encounters:  02/03/17 120/88  01/05/16 114/72  02/19/15 (!) 143/74    Lab results reviewed with patient   ASSESSMENT AND PLAN:  Discussed the following assessment and plan:  Visit for preventive health examination - Plan: Basic metabolic panel, CBC with Differential/Platelet, Hepatic function panel, Lipid panel, TSH, Flu Vaccine QUAD 36+ mos IM, Hepatitis C antibody  Need for hepatitis C screening test - Plan: Hepatitis C antibody  Screening, lipid - Plan: Basic metabolic panel, CBC with Differential/Platelet, Hepatic function panel, Lipid panel, TSH  BMI 38.0-38.9,adult - Plan: Basic metabolic panel, CBC with Differential/Platelet, Hepatic function panel, Lipid  panel, TSH  Need for Tdap vaccination - Plan: Tdap vaccine greater than or equal to 7yo IM NF today   Disc will proceed with lab   Screening hep c not high risk except fore age cohort but mom had hep c dx at a later age ( she had 7 children)  Counseled about healthy weight oss  Fu if gi sx    persistent or progressive or alam sx  Patient Care Team: Panosh, Standley Brooking, MD as PCP - Mauri Brooklyn, MD (Obstetrics and Gynecology)  Lenis Dickinson, MD (Inactive) as Referring Physician (Neurology) Patient Instructions   lifestyle intervention healthy eating and exercise .  Checking hep c screen with your labs  . Try no bread products for 3 weeks   .  If major changes in bowel habits   See Gi but may be a diet  Minor problem.    Will notify you  of labs when available.  Get your mammogram  Will refill flonase     Preventive Care 40-64 Years, Female Preventive care refers to lifestyle choices and visits with your health care provider that can promote health and wellness. What does preventive care include?  A yearly physical exam. This is also called an annual well check.  Dental exams once or twice a year.  Routine eye exams. Ask your health care provider how often you should have your eyes checked.  Personal lifestyle choices, including: ? Daily care of your teeth and gums. ? Regular physical activity. ? Eating a healthy diet. ? Avoiding tobacco and drug use. ? Limiting alcohol use. ? Practicing safe sex. ? Taking low-dose aspirin daily starting at age 22. ? Taking vitamin and mineral supplements as recommended by your health care provider. What happens during an annual well check? The services and screenings done by your health care provider during your annual well check will depend on your age, overall health, lifestyle risk factors, and family history of disease. Counseling Your health care provider may ask you questions about your:  Alcohol use.  Tobacco  use.  Drug use.  Emotional well-being.  Home and relationship well-being.  Sexual activity.  Eating habits.  Work and work Statistician.  Method of birth control.  Menstrual cycle.  Pregnancy history.  Screening You may have the following tests or measurements:  Height, weight, and BMI.  Blood pressure.  Lipid and cholesterol levels. These may be checked every 5 years, or more frequently if you are over 21 years old.  Skin check.  Lung cancer screening. You may have this screening every year starting at age 31 if you have a 30-pack-year history of smoking and currently smoke or have quit within the past 15 years.  Fecal occult blood test (FOBT) of the stool. You may have this test every year starting at age 44.  Flexible sigmoidoscopy or colonoscopy. You may have a sigmoidoscopy every 5 years or a colonoscopy every 10 years starting at age 3.  Hepatitis C blood test.  Hepatitis B blood test.  Sexually transmitted disease (STD) testing.  Diabetes screening. This is done by checking your blood sugar (glucose) after you have not eaten for a while (fasting). You may have this done every 1-3 years.  Mammogram. This may be done every 1-2 years. Talk to your health care provider about when you should start having regular mammograms. This may depend on whether you have a family history of breast cancer.  BRCA-related cancer screening. This may be done if you have a family history of breast, ovarian, tubal, or peritoneal cancers.  Pelvic exam and Pap test. This may be done every 3 years starting at age 42. Starting at age 31, this may be done every 5 years if you have a Pap test in combination with an HPV test.  Bone density scan. This is done to screen for osteoporosis. You may have this scan if you are at high risk for osteoporosis.  Discuss your test results, treatment options, and if necessary, the need for more tests with your health care  provider. Vaccines Your  health care provider may recommend certain vaccines, such as:  Influenza vaccine. This is recommended every year.  Tetanus, diphtheria, and acellular pertussis (Tdap, Td) vaccine. You may need a Td booster every 10 years.  Varicella vaccine. You may need this if you have not been vaccinated.  Zoster vaccine. You may need this after age 35.  Measles, mumps, and rubella (MMR) vaccine. You may need at least one dose of MMR if you were born in 1957 or later. You may also need a second dose.  Pneumococcal 13-valent conjugate (PCV13) vaccine. You may need this if you have certain conditions and were not previously vaccinated.  Pneumococcal polysaccharide (PPSV23) vaccine. You may need one or two doses if you smoke cigarettes or if you have certain conditions.  Meningococcal vaccine. You may need this if you have certain conditions.  Hepatitis A vaccine. You may need this if you have certain conditions or if you travel or work in places where you may be exposed to hepatitis A.  Hepatitis B vaccine. You may need this if you have certain conditions or if you travel or work in places where you may be exposed to hepatitis B.  Haemophilus influenzae type b (Hib) vaccine. You may need this if you have certain conditions.  Talk to your health care provider about which screenings and vaccines you need and how often you need them. This information is not intended to replace advice given to you by your health care provider. Make sure you discuss any questions you have with your health care provider. Document Released: 05/08/2015 Document Revised: 12/30/2015 Document Reviewed: 02/10/2015 Elsevier Interactive Patient Education  2017 [place] K. Panosh M.D.

## 2017-02-03 NOTE — Patient Instructions (Addendum)
lifestyle intervention healthy eating and exercise .  Checking hep c screen with your labs  . Try no bread products for 3 weeks   .  If major changes in bowel habits   See Gi but may be a diet  Minor problem.    Will notify you  of labs when available.  Get your mammogram  Will refill flonase     Preventive Care 40-64 Years, Female Preventive care refers to lifestyle choices and visits with your health care provider that can promote health and wellness. What does preventive care include?  A yearly physical exam. This is also called an annual well check.  Dental exams once or twice a year.  Routine eye exams. Ask your health care provider how often you should have your eyes checked.  Personal lifestyle choices, including: ? Daily care of your teeth and gums. ? Regular physical activity. ? Eating a healthy diet. ? Avoiding tobacco and drug use. ? Limiting alcohol use. ? Practicing safe sex. ? Taking low-dose aspirin daily starting at age 42. ? Taking vitamin and mineral supplements as recommended by your health care provider. What happens during an annual well check? The services and screenings done by your health care provider during your annual well check will depend on your age, overall health, lifestyle risk factors, and family history of disease. Counseling Your health care provider may ask you questions about your:  Alcohol use.  Tobacco use.  Drug use.  Emotional well-being.  Home and relationship well-being.  Sexual activity.  Eating habits.  Work and work Statistician.  Method of birth control.  Menstrual cycle.  Pregnancy history.  Screening You may have the following tests or measurements:  Height, weight, and BMI.  Blood pressure.  Lipid and cholesterol levels. These may be checked every 5 years, or more frequently if you are over 26 years old.  Skin check.  Lung cancer screening. You may have this screening every year starting at age  86 if you have a 30-pack-year history of smoking and currently smoke or have quit within the past 15 years.  Fecal occult blood test (FOBT) of the stool. You may have this test every year starting at age 69.  Flexible sigmoidoscopy or colonoscopy. You may have a sigmoidoscopy every 5 years or a colonoscopy every 10 years starting at age 49.  Hepatitis C blood test.  Hepatitis B blood test.  Sexually transmitted disease (STD) testing.  Diabetes screening. This is done by checking your blood sugar (glucose) after you have not eaten for a while (fasting). You may have this done every 1-3 years.  Mammogram. This may be done every 1-2 years. Talk to your health care provider about when you should start having regular mammograms. This may depend on whether you have a family history of breast cancer.  BRCA-related cancer screening. This may be done if you have a family history of breast, ovarian, tubal, or peritoneal cancers.  Pelvic exam and Pap test. This may be done every 3 years starting at age 22. Starting at age 73, this may be done every 5 years if you have a Pap test in combination with an HPV test.  Bone density scan. This is done to screen for osteoporosis. You may have this scan if you are at high risk for osteoporosis.  Discuss your test results, treatment options, and if necessary, the need for more tests with your health care provider. Vaccines Your health care provider may recommend certain vaccines, such as:  Influenza vaccine. This is recommended every year.  Tetanus, diphtheria, and acellular pertussis (Tdap, Td) vaccine. You may need a Td booster every 10 years.  Varicella vaccine. You may need this if you have not been vaccinated.  Zoster vaccine. You may need this after age 69.  Measles, mumps, and rubella (MMR) vaccine. You may need at least one dose of MMR if you were born in 1957 or later. You may also need a second dose.  Pneumococcal 13-valent conjugate (PCV13)  vaccine. You may need this if you have certain conditions and were not previously vaccinated.  Pneumococcal polysaccharide (PPSV23) vaccine. You may need one or two doses if you smoke cigarettes or if you have certain conditions.  Meningococcal vaccine. You may need this if you have certain conditions.  Hepatitis A vaccine. You may need this if you have certain conditions or if you travel or work in places where you may be exposed to hepatitis A.  Hepatitis B vaccine. You may need this if you have certain conditions or if you travel or work in places where you may be exposed to hepatitis B.  Haemophilus influenzae type b (Hib) vaccine. You may need this if you have certain conditions.  Talk to your health care provider about which screenings and vaccines you need and how often you need them. This information is not intended to replace advice given to you by your health care provider. Make sure you discuss any questions you have with your health care provider. Document Released: 05/08/2015 Document Revised: 12/30/2015 Document Reviewed: 02/10/2015 Elsevier Interactive Patient Education  2017 [employer].

## 2017-02-04 LAB — HEPATITIS C ANTIBODY
HEP C AB: NONREACTIVE
SIGNAL TO CUT-OFF: 0.03 (ref <1.00)

## 2017-02-06 MED ORDER — FLUTICASONE PROPIONATE 50 MCG/ACT NA SUSP: NASAL | 12 refills | Status: DC

## 2017-02-06 NOTE — Addendum Note (Signed)
Addended by: Virl Cagey on: 02/06/2017 03:41 PM   Modules accepted: Orders

## 2017-02-24 ENCOUNTER — Other Ambulatory Visit: Payer: Self-pay | Admitting: Obstetrics and Gynecology

## 2017-02-24 DIAGNOSIS — Z1231 Encounter for screening mammogram for malignant neoplasm of breast: Secondary | ICD-10-CM

## 2017-03-24 ENCOUNTER — Ambulatory Visit: Payer: BC Managed Care – PPO

## 2017-05-01 ENCOUNTER — Inpatient Hospital Stay: Admission: RE | Admit: 2017-05-01 | Payer: BC Managed Care – PPO | Source: Ambulatory Visit

## 2018-02-28 ENCOUNTER — Encounter: Payer: BC Managed Care – PPO | Admitting: Internal Medicine

## 2018-03-13 NOTE — Patient Instructions (Addendum)
Fatigue and sleepiness can be caused by  Untreated sleep apnea Still advise sleep testing but ok to add the device that decreased snoring.   Will notify you  of labs when available. Checking    Full thyroid testing .  Anemia and blood sugar testings  Continued weight loss advised    Preventive Care 40-64 Years, Female Preventive care refers to lifestyle choices and visits with your health care provider that can promote health and wellness. What does preventive care include?  A yearly physical exam. This is also called an annual well check.  Dental exams once or twice a year.  Routine eye exams. Ask your health care provider how often you should have your eyes checked.  Personal lifestyle choices, including: ? Daily care of your teeth and gums. ? Regular physical activity. ? Eating a healthy diet. ? Avoiding tobacco and drug use. ? Limiting alcohol use. ? Practicing safe sex. ? Taking low-dose aspirin daily starting at age 54. ? Taking vitamin and mineral supplements as recommended by your health care provider. What happens during an annual well check? The services and screenings done by your health care provider during your annual well check will depend on your age, overall health, lifestyle risk factors, and family history of disease. Counseling Your health care provider may ask you questions about your:  Alcohol use.  Tobacco use.  Drug use.  Emotional well-being.  Home and relationship well-being.  Sexual activity.  Eating habits.  Work and work Statistician.  Method of birth control.  Menstrual cycle.  Pregnancy history.  Screening You may have the following tests or measurements:  Height, weight, and BMI.  Blood pressure.  Lipid and cholesterol levels. These may be checked every 5 years, or more frequently if you are over 96 years old.  Skin check.  Lung cancer screening. You may have this screening every year starting at age 71 if you have a  30-pack-year history of smoking and currently smoke or have quit within the past 15 years.  Fecal occult blood test (FOBT) of the stool. You may have this test every year starting at age 57.  Flexible sigmoidoscopy or colonoscopy. You may have a sigmoidoscopy every 5 years or a colonoscopy every 10 years starting at age 68.  Hepatitis C blood test.  Hepatitis B blood test.  Sexually transmitted disease (STD) testing.  Diabetes screening. This is done by checking your blood sugar (glucose) after you have not eaten for a while (fasting). You may have this done every 1-3 years.  Mammogram. This may be done every 1-2 years. Talk to your health care provider about when you should start having regular mammograms. This may depend on whether you have a family history of breast cancer.  BRCA-related cancer screening. This may be done if you have a family history of breast, ovarian, tubal, or peritoneal cancers.  Pelvic exam and Pap test. This may be done every 3 years starting at age 110. Starting at age 19, this may be done every 5 years if you have a Pap test in combination with an HPV test.  Bone density scan. This is done to screen for osteoporosis. You may have this scan if you are at high risk for osteoporosis.  Discuss your test results, treatment options, and if necessary, the need for more tests with your health care provider. Vaccines Your health care provider may recommend certain vaccines, such as:  Influenza vaccine. This is recommended every year.  Tetanus, diphtheria, and acellular pertussis (  Tdap, Td) vaccine. You may need a Td booster every 10 years.  Varicella vaccine. You may need this if you have not been vaccinated.  Zoster vaccine. You may need this after age 60.  Measles, mumps, and rubella (MMR) vaccine. You may need at least one dose of MMR if you were born in 1957 or later. You may also need a second dose.  Pneumococcal 13-valent conjugate (PCV13) vaccine. You may  need this if you have certain conditions and were not previously vaccinated.  Pneumococcal polysaccharide (PPSV23) vaccine. You may need one or two doses if you smoke cigarettes or if you have certain conditions.  Meningococcal vaccine. You may need this if you have certain conditions.  Hepatitis A vaccine. You may need this if you have certain conditions or if you travel or work in places where you may be exposed to hepatitis A.  Hepatitis B vaccine. You may need this if you have certain conditions or if you travel or work in places where you may be exposed to hepatitis B.  Haemophilus influenzae type b (Hib) vaccine. You may need this if you have certain conditions.  Talk to your health care provider about which screenings and vaccines you need and how often you need them. This information is not intended to replace advice given to you by your health care provider. Make sure you discuss any questions you have with your health care provider. Document Released: 05/08/2015 Document Revised: 12/30/2015 Document Reviewed: 02/10/2015 Elsevier Interactive Patient Education  2018 [employer]  

## 2018-03-13 NOTE — Progress Notes (Signed)
Chief Complaint  Patient presents with  . Annual Exam    HPI: Patient  Briana Ferguson  57 y.o. comes in today for Preventive Health Care visit    Gets  gyne check dr Leo Grosser.  Concern about ongoing fatigue and fam hx of thyroid disease  Still snores until recent appliance   For mouth and less snoring  still hasn't had osa testing   working on weight loss had done more and gaining back with working from home.      Health Maintenance  Topic Date Due  . HIV Screening  03/20/1978  . PAP SMEAR  12/22/2018  . MAMMOGRAM  04/20/2019  . COLONOSCOPY  02/19/2020  . TETANUS/TDAP  02/04/2027  . INFLUENZA VACCINE  Completed  . Hepatitis C Screening  Completed   Health Maintenance Review LIFESTYLE:  Exercise:  Medium stairs and  Tobacco/ETS: no Alcohol:  no Sugar beverages:  Seldom  Off sugar drinks  Sleep: getting better  Average 5-6    Not sleep apnea testing  Using mouth guard   Drug use: no HH of  3  No pets  Work: 49  - 28  Eating healthier     ROS:  GEN/ HEENT: No fever, significant weight changes sweats headaches vision problems hearing changes, CV/ PULM; No chest pain shortness of breath cough, syncope,edema  change in exercise tolerance. GI /GU: No adominal pain, vomiting, change in bowel habits. No blood in the stool. No significant GU symptoms. SKIN/HEME: ,no acute skin rashes suspicious lesions or bleeding. No lymphadenopathy, nodules, masses.  NEURO/ PSYCH:  No neurologic signs such as weakness numbness. No depression anxiety. IMM/ Allergy: No unusual infections.  Allergy .   REST of 12 system review negative except as per HPI   Past Medical History:  Diagnosis Date  . Attention and concentration deficit 2011   had med trial Dr Candis Schatz ? dx   . Environmental allergies     Past Surgical History:  Procedure Laterality Date  . WISDOM TOOTH EXTRACTION      Family History  Problem Relation Age of Onset  . Diabetes Father   . Stroke Father    deceased  . Heart disease Father   . Diabetes Mother   . Stroke Mother   . Hyperlipidemia Unknown        fhx  . Cancer Unknown        granfather/liver    Social History   Socioeconomic History  . Marital status: Married    Spouse name: Not on file  . Number of children: Not on file  . Years of education: Not on file  . Highest education level: Not on file  Occupational History  . Occupation: Information systems manager: [employer]  . Financial resource strain: Not on file  . Food insecurity:    Worry: Not on file    Inability: Not on file  . Transportation needs:    Medical: Not on file    Non-medical: Not on file  Tobacco Use  . Smoking status: Never Smoker  . Smokeless tobacco: Never Used  Substance and Sexual Activity  . Alcohol use: No    Comment: none  . Drug use: No  . Sexual activity: Not on file  Lifestyle  . Physical activity:    Days per week: Not on file    Minutes per session: Not on file  . Stress: Not on file  Relationships  . Social connections:  Talks on phone: Not on file    Gets together: Not on file    Attends religious service: Not on file    Active member of club or organization: Not on file    Attends meetings of clubs or organizations: Not on file    Relationship status: Not on file  Other Topics Concern  . Not on file  Social History Narrative   Briana Ferguson degree  May 2011.  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      Has smoke detector and wears seat belts.  No firearms. Stored safely .Sees dentist regularly . No depression       CB x 3    Husband with vascectomy             Outpatient Medications Prior to Visit  Medication Sig Dispense Refill  . fluticasone (FLONASE) 50 MCG/ACT nasal spray 2 sprays each nostril as needed 16 g 12  . MAGNESIUM PO Take 2,600 mg by mouth  as needed.     Marland Kitchen HYDROcodone-homatropine (HYCODAN) 5-1.5 MG/5ML syrup 1 tsp at night or every 4-6 hours if needed for cough (Patient not taking: Reported on 02/03/2017) 180 mL 0   No facility-administered medications prior to visit.      EXAM:  BP 124/84   Temp 98.6 F (37 C)   Ht 5' 7.5" (1.715 m) Comment: WITHOUT SHOES  Wt 249 lb (112.9 kg)   BMI 38.42 kg/m   Body mass index is 38.42 kg/m. Wt Readings from Last 3 Encounters:  03/14/18 249 lb (112.9 kg)  02/03/17 256 lb (116.1 kg)  01/05/16 247 lb 6.4 oz (112.2 kg)    Physical Exam: Vital signs reviewed AVW:UJWJ is a well-developed well-nourished alert cooperative    who appearsr stated age in no acute distress.  HEENT: normocephalic atraumatic , Eyes: PERRL EOM's full, conjunctiva clear, Nares: paten,t no deformity discharge or tenderness., Ears: no deformity EAC's clear TMs with normal landmarks. Mouth: clear OP, no lesions, edema.  Moist mucous membranes. Dentition in adequate repair. NECK: supple   Has homogenous , thyromegaly no bruits. CHEST/PULM:  Clear to auscultation and percussion breath sounds equal no wheeze , rales or rhonchi. No chest wall deformities or tenderness.Breast: normal by inspection . No dimpling, discharge, masses, tenderness or discharge .lumpiness Breast: normal by inspection . No dimpling, discharge, masses, tenderness or discharge . CV: PMI is nondisplaced, S1 S2 no gallops, murmurs, rubs. Peripheral pulses are full without delay.No JVD .  ABDOMEN: Bowel sounds normal nontender  No guard or rebound, no hepato splenomegal no CVA tenderness.  No hernia. Extremtities:  No clubbing cyanosis or edema, no acute joint swelling or redness no focal atrophy NEURO:  Oriented x3, cranial nerves 3-12 appear to be intact, no obvious focal weakness,gait within normal limits no abnormal reflexes or asymmetrical SKIN: No acute rashes normal turgor, color, no bruising or petechiae. PSYCH: Oriented, good eye contact,  no obvious depression anxiety, cognition and judgment appear normal. LN: no cervical axillary inguinal adenopathy  Lab Results  Component Value Date   WBC 4.3 02/03/2017   HGB 13.4 02/03/2017   HCT 41.1 02/03/2017   PLT 315.0 02/03/2017   GLUCOSE 70 02/03/2017   CHOL 180 02/03/2017   TRIG 125.0 02/03/2017   HDL  60.20 02/03/2017   LDLCALC 94 02/03/2017   ALT 14 02/03/2017   AST 15 02/03/2017   NA 139 02/03/2017   K 4.1 02/03/2017   CL 100 02/03/2017   CREATININE 1.00 02/03/2017   BUN 10 02/03/2017   CO2 32 02/03/2017   TSH 1.46 02/03/2017    BP Readings from Last 3 Encounters:  03/14/18 124/84  02/03/17 120/88  01/05/16 114/72     ASSESSMENT AND PLAN:  Discussed the following assessment and plan:  Visit for preventive health examination - Plan: Basic metabolic panel, CBC with Differential/Platelet, Hepatic function panel, Lipid panel, TSH, T4, free, T3, free, Thyroid antibodies  Medication management - Plan: Basic metabolic panel, CBC with Differential/Platelet, Hepatic function panel, Lipid panel, TSH, T4, free, T3, free, Thyroid antibodies  Snoring - Plan: Basic metabolic panel, CBC with Differential/Platelet, Hepatic function panel, Lipid panel, TSH, T4, free, T3, free, Thyroid antibodies  Goiter - Plan: Basic metabolic panel, CBC with Differential/Platelet, Hepatic function panel, Lipid panel, TSH, T4, free, T3, free, Thyroid antibodies  Other fatigue - Plan: Basic metabolic panel, CBC with Differential/Platelet, Hepatic function panel, Lipid panel, TSH, T4, free, T3, free, Thyroid antibodies  OSA (obstructive sleep apnea) - Plan: Basic metabolic panel, CBC with Differential/Platelet, Hepatic function panel, Lipid panel, TSH, T4, free, T3, free, Thyroid antibodies  Family history of thyroid disease - mom hypo sisi hyper - Plan: Basic metabolic panel, CBC with Differential/Platelet, Hepatic function panel, Lipid panel, TSH, T4, free, T3, free, Thyroid  antibodies  Need for influenza vaccination - Plan: Flu Vaccine QUAD 6+ mos PF IM (Fluarix Quad PF)  Patient Care Team: Burnis Medin, MD as PCP - General Ena Dawley, MD (Obstetrics and Gynecology) Lenis Dickinson, MD (Inactive) as Referring Physician (Neurology) Patient Instructions  Fatigue and sleepiness can be caused by  Untreated sleep apnea Still advise sleep testing but ok to add the device that decreased snoring.   Will notify you  of labs when available. Checking    Full thyroid testing .  Anemia and blood sugar testings  Continued weight loss advised    Preventive Care 40-64 Years, Female Preventive care refers to lifestyle choices and visits with your health care provider that can promote health and wellness. What does preventive care include?  A yearly physical exam. This is also called an annual well check.  Dental exams once or twice a year.  Routine eye exams. Ask your health care provider how often you should have your eyes checked.  Personal lifestyle choices, including: ? Daily care of your teeth and gums. ? Regular physical activity. ? Eating a healthy diet. ? Avoiding tobacco and drug use. ? Limiting alcohol use. ? Practicing safe sex. ? Taking low-dose aspirin daily starting at age 4. ? Taking vitamin and mineral supplements as recommended by your health care provider. What happens during an annual well check? The services and screenings done by your health care provider during your annual well check will depend on your age, overall health, lifestyle risk factors, and family history of disease. Counseling Your health care provider may ask you questions about your:  Alcohol use.  Tobacco use.  Drug use.  Emotional well-being.  Home and relationship well-being.  Sexual activity.  Eating habits.  Work and work Statistician.  Method of birth control.  Menstrual cycle.  Pregnancy history.  Screening You may have the following tests  or measurements:  Height, weight, and BMI.  Blood pressure.  Lipid and cholesterol levels. These may be checked  every 5 years, or more frequently if you are over 45 years old.  Skin check.  Lung cancer screening. You may have this screening every year starting at age 19 if you have a 30-pack-year history of smoking and currently smoke or have quit within the past 15 years.  Fecal occult blood test (FOBT) of the stool. You may have this test every year starting at age 23.  Flexible sigmoidoscopy or colonoscopy. You may have a sigmoidoscopy every 5 years or a colonoscopy every 10 years starting at age 47.  Hepatitis C blood test.  Hepatitis B blood test.  Sexually transmitted disease (STD) testing.  Diabetes screening. This is done by checking your blood sugar (glucose) after you have not eaten for a while (fasting). You may have this done every 1-3 years.  Mammogram. This may be done every 1-2 years. Talk to your health care provider about when you should start having regular mammograms. This may depend on whether you have a family history of breast cancer.  BRCA-related cancer screening. This may be done if you have a family history of breast, ovarian, tubal, or peritoneal cancers.  Pelvic exam and Pap test. This may be done every 3 years starting at age 52. Starting at age 85, this may be done every 5 years if you have a Pap test in combination with an HPV test.  Bone density scan. This is done to screen for osteoporosis. You may have this scan if you are at high risk for osteoporosis.  Discuss your test results, treatment options, and if necessary, the need for more tests with your health care provider. Vaccines Your health care provider may recommend certain vaccines, such as:  Influenza vaccine. This is recommended every year.  Tetanus, diphtheria, and acellular pertussis (Tdap, Td) vaccine. You may need a Td booster every 10 years.  Varicella vaccine. You may need this if  you have not been vaccinated.  Zoster vaccine. You may need this after age 14.  Measles, mumps, and rubella (MMR) vaccine. You may need at least one dose of MMR if you were born in 1957 or later. You may also need a second dose.  Pneumococcal 13-valent conjugate (PCV13) vaccine. You may need this if you have certain conditions and were not previously vaccinated.  Pneumococcal polysaccharide (PPSV23) vaccine. You may need one or two doses if you smoke cigarettes or if you have certain conditions.  Meningococcal vaccine. You may need this if you have certain conditions.  Hepatitis A vaccine. You may need this if you have certain conditions or if you travel or work in places where you may be exposed to hepatitis A.  Hepatitis B vaccine. You may need this if you have certain conditions or if you travel or work in places where you may be exposed to hepatitis B.  Haemophilus influenzae type b (Hib) vaccine. You may need this if you have certain conditions.  Talk to your health care provider about which screenings and vaccines you need and how often you need them. This information is not intended to replace advice given to you by your health care provider. Make sure you discuss any questions you have with your health care provider. Document Released: 05/08/2015 Document Revised: 12/30/2015 Document Reviewed: 02/10/2015 Elsevier Interactive Patient Education  2018 [place]. Panosh M.D.

## 2018-03-14 ENCOUNTER — Encounter: Payer: Self-pay | Admitting: Internal Medicine

## 2018-03-14 ENCOUNTER — Ambulatory Visit (INDEPENDENT_AMBULATORY_CARE_PROVIDER_SITE_OTHER): Payer: BC Managed Care – PPO | Admitting: Internal Medicine

## 2018-03-14 VITALS — BP 124/84 | Temp 98.6°F | Ht 67.5 in | Wt 249.0 lb

## 2018-03-14 DIAGNOSIS — R0683 Snoring: Secondary | ICD-10-CM | POA: Diagnosis not present

## 2018-03-14 DIAGNOSIS — Z0000 Encounter for general adult medical examination without abnormal findings: Secondary | ICD-10-CM | POA: Diagnosis not present

## 2018-03-14 DIAGNOSIS — R5383 Other fatigue: Secondary | ICD-10-CM

## 2018-03-14 DIAGNOSIS — Z23 Encounter for immunization: Secondary | ICD-10-CM

## 2018-03-14 DIAGNOSIS — E049 Nontoxic goiter, unspecified: Secondary | ICD-10-CM | POA: Diagnosis not present

## 2018-03-14 DIAGNOSIS — Z8349 Family history of other endocrine, nutritional and metabolic diseases: Secondary | ICD-10-CM

## 2018-03-14 DIAGNOSIS — Z79899 Other long term (current) drug therapy: Secondary | ICD-10-CM | POA: Diagnosis not present

## 2018-03-14 DIAGNOSIS — G4733 Obstructive sleep apnea (adult) (pediatric): Secondary | ICD-10-CM

## 2018-03-14 LAB — LIPID PANEL
CHOLESTEROL: 172 mg/dL (ref 0–200)
HDL: 72.3 mg/dL (ref >39.00)
LDL CALC: 82 mg/dL (ref 0–99)
NonHDL: 99.45
TRIGLYCERIDES: 86 mg/dL (ref 0.0–149.0)
Total CHOL/HDL Ratio: 2
VLDL: 17.2 mg/dL (ref 0.0–40.0)

## 2018-03-14 LAB — HEPATIC FUNCTION PANEL
ALBUMIN: 4.4 g/dL (ref 3.5–5.2)
ALT: 14 U/L (ref 0–35)
AST: 14 U/L (ref 0–37)
Alkaline Phosphatase: 77 U/L (ref 39–117)
BILIRUBIN TOTAL: 0.5 mg/dL (ref 0.2–1.2)
Bilirubin, Direct: 0.1 mg/dL (ref 0.0–0.3)
Total Protein: 7.6 g/dL (ref 6.0–8.3)

## 2018-03-14 LAB — BASIC METABOLIC PANEL
BUN: 12 mg/dL (ref 6–23)
CHLORIDE: 101 meq/L (ref 96–112)
CO2: 30 mEq/L (ref 19–32)
Calcium: 9.5 mg/dL (ref 8.4–10.5)
Creatinine, Ser: 0.88 mg/dL (ref 0.40–1.20)
GFR: 85.8 mL/min (ref >60.00)
GLUCOSE: 90 mg/dL (ref 70–99)
POTASSIUM: 4.1 meq/L (ref 3.5–5.1)
Sodium: 138 mEq/L (ref 135–145)

## 2018-03-14 LAB — CBC WITH DIFFERENTIAL/PLATELET
BASOS PCT: 0.3 % (ref 0.0–3.0)
Basophils Absolute: 0 10*3/uL (ref 0.0–0.1)
EOS PCT: 1 % (ref 0.0–5.0)
Eosinophils Absolute: 0 10*3/uL (ref 0.0–0.7)
HCT: 40.7 % (ref 36.0–46.0)
Hemoglobin: 13.5 g/dL (ref 12.0–15.0)
LYMPHS ABS: 1.7 10*3/uL (ref 0.7–4.0)
Lymphocytes Relative: 36.8 % (ref 12.0–46.0)
MCHC: 33.1 g/dL (ref 30.0–36.0)
MCV: 86.3 fl (ref 78.0–100.0)
MONO ABS: 0.4 10*3/uL (ref 0.1–1.0)
MONOS PCT: 8.9 % (ref 3.0–12.0)
NEUTROS PCT: 53 % (ref 43.0–77.0)
Neutro Abs: 2.4 10*3/uL (ref 1.4–7.7)
Platelets: 306 10*3/uL (ref 150.0–400.0)
RBC: 4.71 Mil/uL (ref 3.87–5.11)
RDW: 13.8 % (ref 11.5–15.5)
WBC: 4.6 10*3/uL (ref 4.0–10.5)

## 2018-03-14 LAB — TSH: TSH: 1.6 u[IU]/mL (ref 0.35–4.50)

## 2018-03-14 LAB — T4, FREE: Free T4: 0.79 ng/dL (ref 0.60–1.60)

## 2018-03-14 LAB — T3, FREE: T3, Free: 3.7 pg/mL (ref 2.3–4.2)

## 2018-03-14 NOTE — Telephone Encounter (Signed)
Please advise Dr Panosh, thanks.   

## 2018-03-15 LAB — THYROID ANTIBODIES
Thyroglobulin Ab: <1 IU/mL (ref <=1)
Thyroperoxidase Ab SerPl-aCnc: 1 IU/mL (ref <9)

## 2018-03-21 NOTE — Telephone Encounter (Signed)
Apologies for late reply after result note   I advised we get endocrinology  Consult .   May we  Proceed with consult?

## 2018-03-26 NOTE — Telephone Encounter (Signed)
Please place   ebndocrinology referral  Goiter and fam hx thyroid disease

## 2018-03-26 NOTE — Telephone Encounter (Signed)
Pt would like to proceed with referral? Anyone in particular?

## 2018-03-28 NOTE — Telephone Encounter (Signed)
Referral placed. Pt aware. Nothing further needed. 

## 2018-04-11 ENCOUNTER — Ambulatory Visit (INDEPENDENT_AMBULATORY_CARE_PROVIDER_SITE_OTHER): Payer: BC Managed Care – PPO | Admitting: Internal Medicine

## 2018-04-11 ENCOUNTER — Encounter: Payer: Self-pay | Admitting: Internal Medicine

## 2018-04-11 VITALS — BP 120/80 | HR 75 | Ht 67.5 in | Wt 254.2 lb

## 2018-04-11 DIAGNOSIS — E669 Obesity, unspecified: Secondary | ICD-10-CM | POA: Diagnosis not present

## 2018-04-11 DIAGNOSIS — R5383 Other fatigue: Secondary | ICD-10-CM

## 2018-04-11 DIAGNOSIS — Z6839 Body mass index (BMI) 39.0-39.9, adult: Secondary | ICD-10-CM | POA: Diagnosis not present

## 2018-04-11 DIAGNOSIS — E010 Iodine-deficiency related diffuse (endemic) goiter: Secondary | ICD-10-CM | POA: Diagnosis not present

## 2018-04-11 LAB — VITAMIN D 25 HYDROXY (VIT D DEFICIENCY, FRACTURES): VITD: 29.17 ng/mL — ABNORMAL LOW (ref 30.00–100.00)

## 2018-04-11 NOTE — Patient Instructions (Signed)
-   We will set you for an ultrasound of your thyroid.  - Your thyroid function tests are normal.  - Please discuss screening for obstructive sleep apnea with your primary care physician  - Please exercise 175 minutes per week to help weight loss, brisk walking is the best exercise.

## 2018-04-11 NOTE — Progress Notes (Signed)
Name: Briana Ferguson  MRN/ DOB: [id number], [birth year]    Age/ Sex: 57 y.o., female    PCP: Burnis Medin, MD   Reason for Endocrinology Evaluation: Thyromegaly      Date of Initial Endocrinology Evaluation: 04/12/2018     HPI: Ms. Briana Ferguson is a 57 y.o. female with unremarkabl past medical history . The patient presented for initial endocrinology clinic visit on 04/12/2018 for consultative assistance with her thyromegaly.   Pt with FH of goiter and is concerned about her thyroid. She was noted to have a mildly prominent thyroid. She also has symptoms of fatigue, weight gain with inability to lose weight.   Pt has not noted any change in the size of her neck over the past few months, she denies any prior exposure to radiation.  She denies any new onset hoarseness or dysphagia.   She has multiple symptoms per ROS  HISTORY:  Past Medical History:  Past Medical History:  Diagnosis Date  . Attention and concentration deficit 2011   had med trial Dr Candis Schatz ? dx   . Environmental allergies    Past Surgical History:  Past Surgical History:  Procedure Laterality Date  . WISDOM TOOTH EXTRACTION        Social History:  reports that she has never smoked. She has never used smokeless tobacco. She reports that she does not drink alcohol or use drugs.  Family History: family history includes Cancer in her unknown relative; Diabetes in her father and mother; Heart disease in her father; Hyperlipidemia in her unknown relative; Stroke in her father and mother.   HOME MEDICATIONS: Current Outpatient Medications on File Prior to Visit  Medication Sig Dispense Refill  . fluticasone (FLONASE) 50 MCG/ACT nasal spray 2 sprays each nostril as needed 16 g 12  . MAGNESIUM PO Take 2,600 mg by mouth as needed.      No current facility-administered medications on file prior to visit.       REVIEW OF SYSTEMS: A comprehensive ROS was conducted with the patient and is negative  except as per HPI and below:  Review of Systems  Constitutional: Positive for malaise/fatigue. Negative for weight loss.  HENT: Negative for congestion and sore throat.   Respiratory: Positive for cough. Negative for shortness of breath.   Cardiovascular: Positive for palpitations. Negative for chest pain.  Gastrointestinal: Positive for constipation. Negative for nausea.  Genitourinary: Negative for frequency.  Musculoskeletal: Positive for joint pain and myalgias.  Neurological: Negative for tingling and tremors.  Endo/Heme/Allergies: Positive for polydipsia.  Psychiatric/Behavioral: Negative for depression. The patient is not nervous/anxious.        OBJECTIVE:  VS: BP 120/80 (BP Location: Right Arm, Patient Position: Sitting, Cuff Size: Large)   Pulse 75   Ht 5' 7.5" (1.715 m)   Wt 254 lb 3.2 oz (115.3 kg)   SpO2 98%   BMI 39.23 kg/m    Wt Readings from Last 3 Encounters:  04/11/18 254 lb 3.2 oz (115.3 kg)  03/14/18 249 lb (112.9 kg)  02/03/17 256 lb (116.1 kg)     EXAM: General: Pt appears well and is in NAD  Hydration: Well-hydrated with moist mucous membranes and good skin turgor  Eyes: External eye exam normal without stare, lid lag or exophthalmos.  EOM intact.    Ears, Nose, Throat: Hearing: Grossly intact bilaterally Dental: Good dentition  Throat: Clear without mass, erythema or exudate  Neck: General: Supple without adenopathy. Thyroid: Mild prominence of thyroid  gland.  No nodules appreciated. No thyroid bruit.  Lungs: Clear with good BS bilat with no rales, rhonchi, or wheezes  Heart: Auscultation: RRR.  Abdomen: Normoactive bowel sounds, soft, nontender, without masses or organomegaly palpable  Extremities: BL LE: No pretibial edema normal ROM and strength.  Skin: Hair: Texture and amount normal with gender appropriate distribution Skin Inspection: No rashes Skin Palpation: Skin temperature, texture, and thickness normal to palpation  Neuro: Cranial  nerves: II - XII grossly intact  Motor: Normal strength throughout DTRs: 2+ and symmetric in UE without delay in relaxation phase  Mental Status: Judgment, insight: Intact Orientation: Oriented to time, place, and person Mood and affect: No depression, anxiety, or agitation     DATA REVIEWED: 03/14/18 Results for LILIANNE, DELAIR (MRN [id number]) as of 04/12/2018 10:30  Ref. Range 03/14/2018 14:46  TSH Latest Ref Range: 0.35 - 4.50 uIU/mL 1.60  Triiodothyronine,Free,Serum Latest Ref Range: 2.3 - 4.2 pg/mL 3.7  T4,Free(Direct) Latest Ref Range: 0.60 - 1.60 ng/dL 0.79  Thyroglobulin Ab Latest Ref Range: < or = 1 IU/mL <1  Thyroperoxidase Ab SerPl-aCnc Latest Ref Range: <9 IU/mL 1  Results for BEZA, STEPPE (MRN [id number]) as of 04/12/2018 10:30  Ref. Range 04/11/2018 16:04  VITD Latest Ref Range: 30.00 - 100.00 ng/mL 29.17 (L)    ASSESSMENT/PLAN/RECOMMENDATIONS:   1. Thyromegaly:  - She is bio-chemically euthyroid   - Her non-specific symptoms are NOT attributed to thyroid given her normal TSH. - She has no local neck symptoms, she is definitely at risk for developing a multinodular goiter given her family history, but there's are no guidelines in screening family members for goiter, usually this is a clinical decision or incidental finding on imaging - Will proceed with ultrasound at this time for some prominence of her thyroid  on exam today    2. Fatigue  - I reassured  Her this is not related to her thyroid.  - We discussed the differential of sleep apnea as she is high risk given her weight and the fact that she snores a  Lot at night.  - Will defer to the discretion of her PCP    3. Obesity  - We discussed the importance of lifestyle changes in weight loss to include portion control and exercise.   4. Vitamin D insufficiency :  - Will start OTC Vitamin D 1000 units daily  - She was encouraged to consume the equivalent of 1200 mg of calcium in her diet, for bone  health.  F/U PRN    Signed electronically by: Mack Guise, MD  [facility]  [facility] [address] [place], [place] [address] Phone: [phone] FAX: [phone]   CC: Burnis Medin, [place] [address] [address] Phone: [phone] Fax: [phone]   Return to Endocrinology clinic as below: No future appointments.

## 2018-04-11 NOTE — Telephone Encounter (Signed)
Patient wanting sleep test Per last OV it was discussed but no referral was placed to Pulmonary Sleep Specialist.   Instructions   Return in about 1 year (around 03/15/2019) for depending on labs, preventive /cpx and medications.  Fatigue and sleepiness can be caused by  Untreated sleep apnea Still advise sleep testing but ok to add the device that decreased snoring.   Will notify you  of labs when available. Checking    Full thyroid testing .  Anemia and blood sugar testings  Continued weight loss advised       Last sleep referral placed was 2015  Please advise Dr Regis Bill, thanks.

## 2018-04-12 ENCOUNTER — Encounter: Payer: Self-pay | Admitting: Internal Medicine

## 2018-04-16 NOTE — Telephone Encounter (Signed)
She saw dr Halford Chessman in 2015 and he ordered a spit study   Please refer this message  to him  About reordering the testing  and see if  She needs OV with him    Please do referral to him if required.

## 2018-04-19 ENCOUNTER — Ambulatory Visit
Admission: RE | Admit: 2018-04-19 | Discharge: 2018-04-19 | Disposition: A | Discharge status: Home / Self Care | Payer: BC Managed Care – PPO | Source: Ambulatory Visit | Attending: Internal Medicine | Admitting: Internal Medicine

## 2018-04-19 DIAGNOSIS — E010 Iodine-deficiency related diffuse (endemic) goiter: Secondary | ICD-10-CM

## 2018-04-20 NOTE — Telephone Encounter (Signed)
Taken care of in previous TE/mychart

## 2018-04-24 LAB — HM PAP SMEAR

## 2018-05-07 ENCOUNTER — Encounter: Payer: Self-pay | Admitting: Internal Medicine

## 2018-06-25 ENCOUNTER — Institutional Professional Consult (permissible substitution): Payer: BC Managed Care – PPO | Admitting: Pulmonary Disease

## 2018-06-29 ENCOUNTER — Encounter: Payer: Self-pay | Admitting: Pulmonary Disease

## 2018-06-29 ENCOUNTER — Ambulatory Visit (INDEPENDENT_AMBULATORY_CARE_PROVIDER_SITE_OTHER): Payer: BC Managed Care – PPO | Admitting: Pulmonary Disease

## 2018-06-29 VITALS — BP 122/78 | HR 76 | Ht 68.0 in | Wt 263.0 lb

## 2018-06-29 DIAGNOSIS — R0681 Apnea, not elsewhere classified: Secondary | ICD-10-CM | POA: Diagnosis not present

## 2018-06-29 DIAGNOSIS — G478 Other sleep disorders: Secondary | ICD-10-CM | POA: Diagnosis not present

## 2018-06-29 NOTE — Progress Notes (Signed)
Briana Ferguson    [id number]    June 12, 1960  Primary Care Physician:Panosh, Standley Brooking, MD  Referring Physician: Burnis Medin, MD [place], [place] [id number]  Chief complaint:   Patient with a history of snoring, witnessed apneas  HPI:  Has noted significant weight gain recently Increased snoring, witnessed apneas as told by her spouse She has tried boil and bite oral devices and recently has tried a tongue retainer with some improvement in the snoring Usually goes to bed about 10:30 PM, falls asleep quickly Wakes up numerous times during the night Final awakening time about 7 AM  She does have daytime tiredness  She had a sleep study in 2013-unaware of results  Denies any headaches in the morning She does have dryness of her throat in the mornings No night sweats  She is not short of breath with normal activity  Denies any significant comorbidities  Outpatient Encounter Medications as of 06/29/2018  Medication Sig  . fluticasone (FLONASE) 50 MCG/ACT nasal spray 2 sprays each nostril as needed  . MAGNESIUM PO Take 2,600 mg by mouth as needed.    No facility-administered encounter medications on file as of 06/29/2018.     Allergies as of 06/29/2018 - Review Complete 06/29/2018  Allergen Reaction Noted  . Sulfamethoxazole-trimethoprim      Past Medical History:  Diagnosis Date  . Attention and concentration deficit 2011   had med trial Dr Candis Schatz ? dx   . Environmental allergies     Past Surgical History:  Procedure Laterality Date  . WISDOM TOOTH EXTRACTION      Family History  Problem Relation Age of Onset  . Diabetes Father   . Stroke Father        deceased  . Heart disease Father   . Sleep apnea Father   . Diabetes Mother   . Stroke Mother   . Sleep apnea Mother   . Hyperlipidemia Other        fhx  . Cancer Other        granfather/liver    Social History   Socioeconomic History  . Marital status: Married   Spouse name: Not on file  . Number of children: Not on file  . Years of education: Not on file  . Highest education level: Not on file  Occupational History  . Occupation: Information systems manager: [employer]  . Financial resource strain: Not on file  . Food insecurity:    Worry: Not on file    Inability: Not on file  . Transportation needs:    Medical: Not on file    Non-medical: Not on file  Tobacco Use  . Smoking status: Never Smoker  . Smokeless tobacco: Never Used  Substance and Sexual Activity  . Alcohol use: No    Comment: none  . Drug use: No  . Sexual activity: Not on file  Lifestyle  . Physical activity:    Days per week: Not on file    Minutes per session: Not on file  . Stress: Not on file  Relationships  . Social connections:    Talks on phone: Not on file    Gets together: Not on file    Attends religious service: Not on file    Active member of club or organization: Not on file    Attends meetings of clubs or organizations: Not on file  Relationship status: Not on file  . Intimate partner violence:    Fear of current or ex partner: Not on file    Emotionally abused: Not on file    Physically abused: Not on file    Forced sexual activity: Not on file  Other Topics Concern  . Not on file  Social History Narrative   [name] degree  May 2011.  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      Has smoke detector and wears seat belts.  No firearms. Stored safely .Sees dentist regularly . No depression       CB x 3    Husband with vascectomy             Review of Systems  Constitutional: Negative.   HENT: Negative.   Eyes: Negative.   Respiratory: Positive for apnea.   Cardiovascular: Negative.   Gastrointestinal: Negative.   Psychiatric/Behavioral: Positive for sleep disturbance.     Vitals:   06/29/18 1554  BP: 122/78  Pulse: 76  SpO2: 98%     Physical Exam  Constitutional: She appears well-developed and well-nourished.  HENT:  Head: Normocephalic and atraumatic.  Crowded oropharynx  Eyes: Pupils are equal, round, and reactive to light. Conjunctivae and EOM are normal. Right eye exhibits no discharge. Left eye exhibits no discharge.  Neck: Normal range of motion. Neck supple. No tracheal deviation present. No thyromegaly present.  Cardiovascular: Normal rate and regular rhythm.  Pulmonary/Chest: Effort normal. No respiratory distress. She has no wheezes. She has no rales.  Abdominal: Soft. She exhibits no distension. There is no abdominal tenderness.   Results of the Epworth flowsheet 06/29/2018  Sitting and reading 2  Watching TV 3  Sitting, inactive in a public place (e.g. a theatre or a meeting) 0  As a passenger in a car for an hour without a break 1  Lying down to rest in the afternoon when circumstances permit 3  Sitting and talking to someone 0  Sitting quietly after a lunch without alcohol 3  In a car, while stopped for a few minutes in traffic 0  Total score 12   Assessment:  Moderate probability of significant sleep disordered breathing  Excessive daytime sleepiness  Patient has tried a bite and block oral device, tongue retaining device which she feels helps her symptoms and will be interested in this as a treatment option  Plan/Recommendations:  We will schedule the patient for an overnight polysomnogram  Importance of weight loss and regular exercises discussed Behavioral modifications, sleep position modification discussed  I will see her back in the office in about 1 to 3 months  Encouraged to call with significant concerns  Sherrilyn Rist MD [facility] 06/29/2018, 4:13 PM  CC: Panosh, Standley Brooking, MD

## 2018-06-29 NOTE — Patient Instructions (Signed)
Moderate probability of sleep disordered breathing  We will schedule you for an overnight polysomnogram  Treatment options for sleep disordered breathing as discussed  Tentatively we will see you back in the office in about 1 to 3 months  Sleep Apnea Sleep apnea is a condition in which breathing pauses or becomes shallow during sleep. Episodes of sleep apnea usually last 10 seconds or longer, and they may occur as many as 20 times an hour. Sleep apnea disrupts your sleep and keeps your body from getting the rest that it needs. This condition can increase your risk of certain health problems, including:  Heart attack.  Stroke.  Obesity.  Diabetes.  Heart failure.  Irregular heartbeat. There are three kinds of sleep apnea:  Obstructive sleep apnea. This kind is caused by a blocked or collapsed airway.  Central sleep apnea. This kind happens when the part of the brain that controls breathing does not send the correct signals to the muscles that control breathing.  Mixed sleep apnea. This is a combination of obstructive and central sleep apnea. What are the causes? The most common cause of this condition is a collapsed or blocked airway. An airway can collapse or become blocked if:  Your throat muscles are abnormally relaxed.  Your tongue and tonsils are larger than normal.  You are overweight.  Your airway is smaller than normal. What increases the risk? This condition is more likely to develop in people who:  Are overweight.  Smoke.  Have a smaller than normal airway.  Are elderly.  Are female.  Drink alcohol.  Take sedatives or tranquilizers.  Have a family history of sleep apnea. What are the signs or symptoms? Symptoms of this condition include:  Trouble staying asleep.  Daytime sleepiness and tiredness.  Irritability.  Loud snoring.  Morning headaches.  Trouble concentrating.  Forgetfulness.  Decreased interest in sex.  Unexplained  sleepiness.  Mood swings.  Personality changes.  Feelings of depression.  Waking up often during the night to urinate.  Dry mouth.  Sore throat. How is this diagnosed? This condition may be diagnosed with:  A medical history.  A physical exam.  A series of tests that are done while you are sleeping (sleep study). These tests are usually done in a sleep lab, but they may also be done at home. How is this treated? Treatment for this condition aims to restore normal breathing and to ease symptoms during sleep. It may involve managing health issues that can affect breathing, such as high blood pressure or obesity. Treatment may include:  Sleeping on your side.  Using a decongestant if you have nasal congestion.  Avoiding the use of depressants, including alcohol, sedatives, and narcotics.  Losing weight if you are overweight.  Making changes to your diet.  Quitting smoking.  Using a device to open your airway while you sleep, such as: ? An oral appliance. This is a custom-made mouthpiece that shifts your lower jaw forward. ? A continuous positive airway pressure (CPAP) device. This device delivers oxygen to your airway through a mask. ? A nasal expiratory positive airway pressure (EPAP) device. This device has valves that you put into each nostril. ? A bi-level positive airway pressure (BPAP) device. This device delivers oxygen to your airway through a mask.  Surgery if other treatments do not work. During surgery, excess tissue is removed to create a wider airway. It is important to get treatment for sleep apnea. Without treatment, this condition can lead to:  High blood  pressure.  Coronary artery disease.  (Men) An inability to achieve or maintain an erection (impotence).  Reduced thinking abilities. Follow these instructions at home:  Make any lifestyle changes that your health care provider recommends.  Eat a healthy, well-balanced diet.  Take  over-the-counter and prescription medicines only as told by your health care provider.  Avoid using depressants, including alcohol, sedatives, and narcotics.  Take steps to lose weight if you are overweight.  If you were given a device to open your airway while you sleep, use it only as told by your health care provider.  Do not use any tobacco products, such as cigarettes, chewing tobacco, and e-cigarettes. If you need help quitting, ask your health care provider.  Keep all follow-up visits as told by your health care provider. This is important. Contact a health care provider if:  The device that you received to open your airway during sleep is uncomfortable or does not seem to be working.  Your symptoms do not improve.  Your symptoms get worse. Get help right away if:  You develop chest pain.  You develop shortness of breath.  You develop discomfort in your back, arms, or stomach.  You have trouble speaking.  You have weakness on one side of your body.  You have drooping in your face. These symptoms may represent a serious problem that is an emergency. Do not wait to see if the symptoms will go away. Get medical help right away. Call your local emergency services (911 in the [place] Do not drive yourself to the hospital. This information is not intended to replace advice given to you by your health care provider. Make sure you discuss any questions you have with your health care provider. Document Released: 04/01/2002 Document Revised: 11/07/2016 Document Reviewed: 01/19/2015 Elsevier Interactive Patient Education  2019 [employer].

## 2018-08-17 ENCOUNTER — Ambulatory Visit: Payer: BC Managed Care – PPO | Admitting: Pulmonary Disease

## 2018-08-21 ENCOUNTER — Encounter (HOSPITAL_BASED_OUTPATIENT_CLINIC_OR_DEPARTMENT_OTHER): Payer: BC Managed Care – PPO

## 2018-08-30 ENCOUNTER — Other Ambulatory Visit: Payer: Self-pay

## 2018-08-30 NOTE — Progress Notes (Addendum)
Chief Complaint  Patient presents with  . Ear Pain    pt had ear pain in right ear last week and states been having muffled hearing for the past few weeks     HPI: Briana Ferguson 58 y.o.  Concern about ear wax build up so comes in for in person office visit   No uri sx does use q tips     Mild discomfort   ROS: See pertinent positives and negatives per HPI.  Past Medical History:  Diagnosis Date  . Attention and concentration deficit 2011   had med trial Dr Candis Schatz ? dx   . Environmental allergies     Family History  Problem Relation Age of Onset  . Diabetes Father   . Stroke Father        deceased  . Heart disease Father   . Sleep apnea Father   . Diabetes Mother   . Stroke Mother   . Sleep apnea Mother   . Hyperlipidemia Other        fhx  . Cancer Other        granfather/liver    Social History   Socioeconomic History  . Marital status: Married    Spouse name: Not on file  . Number of children: Not on file  . Years of education: Not on file  . Highest education level: Not on file  Occupational History  . Occupation: Information systems manager: [employer]  . Financial resource strain: Not on file  . Food insecurity:    Worry: Not on file    Inability: Not on file  . Transportation needs:    Medical: Not on file    Non-medical: Not on file  Tobacco Use  . Smoking status: Never Smoker  . Smokeless tobacco: Never Used  Substance and Sexual Activity  . Alcohol use: No    Comment: none  . Drug use: No  . Sexual activity: Not on file  Lifestyle  . Physical activity:    Days per week: Not on file    Minutes per session: Not on file  . Stress: Not on file  Relationships  . Social connections:    Talks on phone: Not on file    Gets together: Not on file    Attends religious service: Not on file    Active member of club or organization: Not on file    Attends meetings of clubs or organizations: Not on file   Relationship status: Not on file  Other Topics Concern  . 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      Has smoke detector and wears seat belts.  No firearms. Stored safely .Sees dentist regularly . No depression       CB x 3    Husband with vascectomy             Outpatient Medications Prior to Visit  Medication Sig Dispense Refill  . fluticasone (FLONASE) 50 MCG/ACT nasal spray 2 sprays each nostril as needed 16 g 12  . MAGNESIUM PO Take 2,600 mg by mouth as needed.      No facility-administered  medications prior to visit.      EXAM:  BP 136/74 (BP Location: Right Arm, Patient Position: Sitting, Cuff Size: Large)   Pulse 86   Temp 98.8 F (37.1 C) (Oral)   Wt 258 lb 1.6 oz (117.1 kg)   SpO2 96%   BMI 39.24 kg/m   Body mass index is 39.24 kg/m.  GENERAL: vitals reviewed and listed above, alert, oriented, appears well hydrated and in no acute distress HEENT: atraumatic, conjunctiva  clear, no obvious abnormalities on inspection of external nose and ears  Left eac and tm nl  Right eac large amount of wax   After irrigation dn removal tm intact and  Reports can hear better  NECK: no obvious masses on inspection palpation   MS: moves all extremities without noticeable focal  abnormality PSYCH: pleasant and cooperative, no obvious depression or anxiety  ASSESSMENT AND PLAN:  Discussed the following assessment and plan:  Right ear pain - Plan: Ear Lavage  Impacted cerumen of right ear  Excessive ear wax, right  Decreased hearing of right ear Ear wax was impacted effecting hearing  -Patient advised to return or notify health care team  if symptoms worsen ,persist or new concerns arise.  Patient Instructions  No q tips      Briana Ferguson M.D.

## 2018-08-31 ENCOUNTER — Encounter: Payer: Self-pay | Admitting: Internal Medicine

## 2018-08-31 ENCOUNTER — Other Ambulatory Visit: Payer: Self-pay

## 2018-08-31 ENCOUNTER — Ambulatory Visit: Payer: BC Managed Care – PPO | Admitting: Internal Medicine

## 2018-08-31 VITALS — BP 136/74 | HR 86 | Temp 98.8°F | Wt 258.1 lb

## 2018-08-31 DIAGNOSIS — H6121 Impacted cerumen, right ear: Secondary | ICD-10-CM | POA: Diagnosis not present

## 2018-08-31 DIAGNOSIS — H9191 Unspecified hearing loss, right ear: Secondary | ICD-10-CM | POA: Diagnosis not present

## 2018-08-31 DIAGNOSIS — H9201 Otalgia, right ear: Secondary | ICD-10-CM

## 2018-08-31 NOTE — Patient Instructions (Signed)
No q tips 

## 2018-10-04 ENCOUNTER — Other Ambulatory Visit: Payer: Self-pay

## 2018-10-04 ENCOUNTER — Ambulatory Visit (HOSPITAL_BASED_OUTPATIENT_CLINIC_OR_DEPARTMENT_OTHER): Payer: BC Managed Care – PPO | Attending: Pulmonary Disease | Admitting: Pulmonary Disease

## 2018-10-04 DIAGNOSIS — G478 Other sleep disorders: Secondary | ICD-10-CM | POA: Diagnosis present

## 2018-10-04 DIAGNOSIS — R0902 Hypoxemia: Secondary | ICD-10-CM | POA: Diagnosis not present

## 2018-10-04 DIAGNOSIS — G4733 Obstructive sleep apnea (adult) (pediatric): Secondary | ICD-10-CM | POA: Diagnosis not present

## 2018-10-04 DIAGNOSIS — R0681 Apnea, not elsewhere classified: Secondary | ICD-10-CM

## 2018-10-10 ENCOUNTER — Telehealth: Payer: Self-pay | Admitting: Pulmonary Disease

## 2018-10-10 NOTE — Telephone Encounter (Signed)
ATC patient.  Left message for Patient to call office for sleep study results.

## 2018-10-10 NOTE — Procedures (Signed)
POLYSOMNOGRAPHY  Last, First: Briana, Ferguson MRN: [id number] Gender: Female Age (years): 58 Weight (lbs): 255 DOB: 11-27-1960 BMI: 39 Primary Care: No PCP Epworth Score: 12 Referring: Laurin Coder MD Technician: Baxter Flattery Interpreting: Laurin Coder MD Study Type: NPSG Ordered Study Type: NPSG Study date: 10/04/2018 Location: [place] CLINICAL INFORMATION Briana Ferguson is a 58 year old Female and was referred to the sleep center for evaluation of N/A. Indications include Fatigue, Parasomnias, Snoring, Witnesses Apnea / Gasping During Sleep.  MEDICATIONS Patient self administered medications include: N/A. Medications administered during study include No sleep medicine administered.  SLEEP STUDY TECHNIQUE A multi-channel overnight Polysomnography study was performed. The channels recorded and monitored were central and occipital EEG, electrooculogram (EOG), submentalis EMG (chin), nasal and oral airflow, thoracic and abdominal wall motion, anterior tibialis EMG, snore microphone, electrocardiogram, and a pulse oximetry. TECHNICIAN COMMENTS Comments added by Technician: Patient had difficulty initiating sleep. Patient was restless all through the night. Comments added by Scorer: N/A SLEEP ARCHITECTURE The study was initiated at 10:11:49 PM and terminated at 5:00:21 AM. The total recorded time was 408.5 minutes. EEG confirmed total sleep time was 329.5 minutes yielding a sleep efficiency of 80.7%%. Sleep onset after lights out was 23.0 minutes with a REM latency of 68.0 minutes. The patient spent 8.2%% of the night in stage N1 sleep, 77.8%% in stage N2 sleep, 0.0%% in stage N3 and 14% in REM. Wake after sleep onset (WASO) was 56 minutes. The Arousal Index was 10.9/hour. RESPIRATORY PARAMETERS There were a total of 158 respiratory disturbances out of which 59 were apneas ( 54 obstructive, 3 mixed, 2 central) and 99 hypopneas. The apnea/hypopnea index (AHI) was 28.8  events/hour. The central sleep apnea index was 0.4 events/hour. The REM AHI was 28.7 events/hour and NREM AHI was 28.8 events/hour. The supine AHI was 110.8 events/hour and the non supine AHI was 15.6 supine during 13.81% of sleep. Respiratory disturbances were associated with oxygen desaturation down to a nadir of 85.0% during sleep. The mean oxygen saturation during the study was 93.3%. The cumulative time under 88% oxygen saturation was 5.5 minutes.  LEG MOVEMENT DATA The total leg movements were 1 with a resulting leg movement index of 0.2/hr .Associated arousal with leg movement index was 0.2/hr.  CARDIAC DATA The underlying cardiac rhythm was most consistent with sinus rhythm. Mean heart rate during sleep was 74.0 bpm. Additional rhythm abnormalities include None.   IMPRESSIONS - Moderate Obstructive Sleep apnea(OSA) - EKG showed no cardiac abnormalities. - Mild Oxygen Desaturation - The patient snored with loud snoring volume. - No significant periodic leg movements(PLMs) during sleep, no significant associated arousals.   DIAGNOSIS - Obstructive Sleep Apnea (327.23 [G47.33 ICD-10]) - Mild nocturnal Hypoxemia (327.26 [G47.36 ICD-10])   RECOMMENDATIONS - Auto-titrating CPAP with CPAP settings of 5 to 18 cm with heated humidification,  with close clinical follow-up and monitoring. - Therapeutic CPAP titration to determine optimal pressure required to alleviate sleep disordered breathing will be a second option. - Positional therapy avoiding supine position during sleep. - Avoid alcohol, sedatives and other CNS depressants that may worsen sleep apnea and disrupt normal sleep architecture. - Sleep hygiene should be reviewed to assess factors that may improve sleep quality. - Weight management and regular exercise should be initiated or continued.  [Electronically signed] 10/10/2018 07:50 AM  Sherrilyn Rist MD NPI: [id number]

## 2018-10-10 NOTE — Telephone Encounter (Signed)
Patient is returning phone call.  Patient phone number is [phone].

## 2018-10-10 NOTE — Telephone Encounter (Signed)
Sleep study result  Date of study 10/04/2018  Moderate obstructive sleep apnea  Recommendation: DME referral Auto-titrating CPAP with CPAP settings of 5 to 18 cm of water with heated humidification, close clinical follow-up. Encourage weight loss efforts Encourage behaviors to help promote adequate sleep and improve sleep hygiene.

## 2018-10-10 NOTE — Telephone Encounter (Signed)
Spoke with patient. She is aware of results, verbalized understanding. She stated that she will reach out to her insurance to see if what exactly they will cover. Advised her that our office normally does this, but she wants to do this on her own. She also wants time to think about starting CPAP.   She will call back once she has talked to her insurance. Will leave this encounter open for her call back.

## 2018-10-10 NOTE — Telephone Encounter (Signed)
Pt returned call for her sleep study results and would like a call back on her work cell [phone]

## 2018-10-10 NOTE — Telephone Encounter (Signed)
ATC patient.  Left message to call back for sleep study results.

## 2018-10-12 NOTE — Telephone Encounter (Signed)
Pt returning call  A/b sleep study and can be reached @ [phone].Briana Ferguson

## 2018-10-12 NOTE — Telephone Encounter (Signed)
ATC pt, line went to voicemail. LMTCB X1.  

## 2018-10-13 NOTE — Telephone Encounter (Signed)
noted 

## 2018-10-17 ENCOUNTER — Telehealth: Payer: Self-pay | Admitting: Pulmonary Disease

## 2018-10-17 DIAGNOSIS — G4733 Obstructive sleep apnea (adult) (pediatric): Secondary | ICD-10-CM

## 2018-10-17 NOTE — Telephone Encounter (Signed)
Sleep study result  Date of study 10/04/2018  Moderate obstructive sleep apnea  Recommendation: DME referral Auto-titrating CPAP with CPAP settings of 5 to 18 cm of water with heated humidification, close clinical follow-up. Encourage weight loss efforts Encourage behaviors to help promote adequate sleep and improve sleep hygiene.   Pt was made aware of the results 10/10/2018 and stated she would call us back if she decided she wanted to begin CPAP.  Attempted to call pt but unable to reach. Left message for pt to return call.

## 2018-10-17 NOTE — Telephone Encounter (Signed)
Patient is returning phone call.  Patient phone number is [phone].

## 2018-10-17 NOTE — Telephone Encounter (Signed)
Attempted to call pt but line went straight to VM. Left message for pt to return call. 

## 2018-10-18 NOTE — Telephone Encounter (Signed)
Called and spoke with pt to further discuss the HST report. After further discussing the report with pt, pt stated that she has decided to go ahead and start on CPAP therapy. I have scheduled pt a follow up appt for after she has been started on CPAP.  Order has been pended. Due to Dr.Olalere not being in the office, Aaron Edelman, please advise if you will be okay signing off on the order for pt's CPAP start?

## 2018-10-18 NOTE — Telephone Encounter (Signed)
Order has been placed. Nothing further needed. 

## 2018-10-18 NOTE — Telephone Encounter (Signed)
Yes okay to sign.  Please make sure the patient is scheduled for 2 to 46-month follow-up to check CPAP compliance.  Patient must have the ability show 30 days compliance at least with her CPAP at that office visit.Wyn Quaker, FNP

## 2018-10-18 NOTE — Telephone Encounter (Signed)
Pt is returning call. Cb is [phone], Work Cell.

## 2018-12-20 ENCOUNTER — Other Ambulatory Visit: Payer: Self-pay

## 2018-12-20 ENCOUNTER — Ambulatory Visit: Payer: BC Managed Care – PPO | Admitting: Nurse Practitioner

## 2018-12-20 ENCOUNTER — Ambulatory Visit: Payer: BC Managed Care – PPO | Admitting: Pulmonary Disease

## 2018-12-20 ENCOUNTER — Encounter: Payer: Self-pay | Admitting: Pulmonary Disease

## 2018-12-20 VITALS — BP 126/82 | HR 92 | Temp 97.8°F | Ht 68.0 in | Wt 263.0 lb

## 2018-12-20 DIAGNOSIS — G4733 Obstructive sleep apnea (adult) (pediatric): Secondary | ICD-10-CM | POA: Diagnosis not present

## 2018-12-20 DIAGNOSIS — Z23 Encounter for immunization: Secondary | ICD-10-CM | POA: Diagnosis not present

## 2018-12-20 DIAGNOSIS — Z0000 Encounter for general adult medical examination without abnormal findings: Secondary | ICD-10-CM | POA: Diagnosis not present

## 2018-12-20 NOTE — Assessment & Plan Note (Signed)
Plan: °Flu vaccine today °

## 2018-12-20 NOTE — Progress Notes (Signed)
@Patient  ID: Briana Ferguson, female    DOB: 09-15-1960, 58 y.o.   MRN: [id number]  Chief Complaint  Patient presents with  . Follow-up    F/U for CPAP. Uses [facility] as her DME.     Referring provider: Burnis Medin, MD  HPI:  58 year old female never smoker followed in our office for moderate obstructive sleep apnea  PMH: Obesity, questionable GERD Smoker/ Smoking History: Never smoker Maintenance: None Pt of: Dr. Ander Slade   12/20/2018  - Visit   58 year old female never smoker followed in our office for mild obstructive sleep apnea.  Patient was started on CPAP therapy and has been tolerating it well.  Patient reports that she already feels significant improvement in her sleep, sleep quality, daytime sleepiness.  She also reports that her snoring has decreased.  CPAP compliance report confirms excellent compliance see compliance report listed below:  11/20/2018-12/19/2018-CPAP compliance report-30 had a last 30 days use, 25 those days greater than 4 hours, average usage 5 hours and 26 minutes, APAP setting 5-15, AHI 0.4   Tests:   10/04/2018-sleep study- AHI 28.8  Results of the Epworth flowsheet 06/29/2018  Sitting and reading 2  Watching TV 3  Sitting, inactive in a public place (e.g. a theatre or a meeting) 0  As a passenger in a car for an hour without a break 1  Lying down to rest in the afternoon when circumstances permit 3  Sitting and talking to someone 0  Sitting quietly after a lunch without alcohol 3  In a car, while stopped for a few minutes in traffic 0  Total score 12    FENO:  No results found for: NITRICOXIDE  PFT: No flowsheet data found.  Imaging: No results found.    Specialty Problems      Pulmonary Problems   ALLERGIC RHINITIS    Qualifier: Diagnosis of  By: Scherrie Gerlach        OSA (obstructive sleep apnea)    10/04/2018-sleep study- AHI 28.8      Snoring      Allergies  Allergen Reactions  . Sulfamethoxazole-Trimethoprim      REACTION: feels faint/per pt not sure if allergic to med    Immunization History  Administered Date(s) Administered  . Influenza Split 12/28/2011  . Influenza,inj,Quad PF,6+ Mos 01/28/2014, 01/27/2015, 01/05/2016, 02/03/2017, 03/14/2018, 12/20/2018  . PPD Test 11/22/2010  . Td 12/26/2006  . Tdap 02/03/2017    Past Medical History:  Diagnosis Date  . Attention and concentration deficit 2011   had med trial Dr Candis Schatz ? dx   . Environmental allergies     Tobacco History: Social History   Tobacco Use  Smoking Status Never Smoker  Smokeless Tobacco Never Used   Counseling given: Not Answered   Continue to not smoke  Outpatient Encounter Medications as of 12/20/2018  Medication Sig  . fluticasone (FLONASE) 50 MCG/ACT nasal spray 2 sprays each nostril as needed  . MAGNESIUM PO Take 2,600 mg by mouth as needed.    No facility-administered encounter medications on file as of 12/20/2018.      Review of Systems  Review of Systems  Constitutional: Negative for activity change, fatigue and fever.  HENT: Negative for sinus pressure, sinus pain and sore throat.   Respiratory: Negative for cough, shortness of breath and wheezing.   Cardiovascular: Negative for chest pain and palpitations.  Gastrointestinal: Negative for diarrhea, nausea and vomiting.  Musculoskeletal: Negative for arthralgias.  Neurological: Negative for dizziness.  Psychiatric/Behavioral: Negative  for sleep disturbance. The patient is not nervous/anxious.      Physical Exam  BP 126/82 (BP Location: Left Arm, Patient Position: Sitting, Cuff Size: Large)   Pulse 92   Temp 97.8 F (36.6 C) (Oral)   Ht 5\' 8"  (1.727 m)   Wt 263 lb (119.3 kg)   SpO2 96%   BMI 39.99 kg/m   Wt Readings from Last 5 Encounters:  12/20/18 263 lb (119.3 kg)  10/04/18 255 lb (115.7 kg)  08/31/18 258 lb 1.6 oz (117.1 kg)  06/29/18 263 lb (119.3 kg)  04/11/18 254 lb 3.2 oz (115.3 kg)     Physical Exam Vitals signs  and nursing note reviewed.  Constitutional:      General: She is not in acute distress.    Appearance: Normal appearance. She is obese.  HENT:     Head: Normocephalic and atraumatic.     Right Ear: External ear normal.     Left Ear: External ear normal.  Eyes:     Pupils: Pupils are equal, round, and reactive to light.  Neck:     Musculoskeletal: Normal range of motion.  Cardiovascular:     Rate and Rhythm: Normal rate and regular rhythm.     Pulses: Normal pulses.     Heart sounds: Normal heart sounds. No murmur.  Pulmonary:     Effort: Pulmonary effort is normal. No respiratory distress.     Breath sounds: Normal breath sounds. No decreased air movement. No decreased breath sounds, wheezing or rales.  Skin:    General: Skin is warm and dry.     Capillary Refill: Capillary refill takes less than 2 seconds.  Neurological:     General: No focal deficit present.     Mental Status: She is alert and oriented to person, place, and time. Mental status is at baseline.     Gait: Gait normal.  Psychiatric:        Mood and Affect: Mood normal.        Behavior: Behavior normal.        Thought Content: Thought content normal.        Judgment: Judgment normal.      Lab Results:  CBC    Component Value Date/Time   WBC 4.6 03/14/2018 1446   RBC 4.71 03/14/2018 1446   HGB 13.5 03/14/2018 1446   HCT 40.7 03/14/2018 1446   PLT 306.0 03/14/2018 1446   MCV 86.3 03/14/2018 1446   MCHC 33.1 03/14/2018 1446   RDW 13.8 03/14/2018 1446   LYMPHSABS 1.7 03/14/2018 1446   MONOABS 0.4 03/14/2018 1446   EOSABS 0.0 03/14/2018 1446   BASOSABS 0.0 03/14/2018 1446    BMET    Component Value Date/Time   NA 138 03/14/2018 1446   K 4.1 03/14/2018 1446   CL 101 03/14/2018 1446   CO2 30 03/14/2018 1446   GLUCOSE 90 03/14/2018 1446   BUN 12 03/14/2018 1446   CREATININE 0.88 03/14/2018 1446   CALCIUM 9.5 03/14/2018 1446   GFRNONAA 64 07/24/2007 1519   GFRAA 77 07/24/2007 1519    BNP No  results found for: BNP  ProBNP No results found for: PROBNP    Assessment & Plan:   OSA (obstructive sleep apnea) Plan: Continue CPAP therapy Follow-up in 1 year  Healthcare maintenance Plan: Flu vaccine today    Return in about 1 year (around 12/20/2019), or if symptoms worsen or fail to improve, for Follow up with Wyn Quaker FNP-C, Follow up with Dr.  Olalere.   Lauraine Rinne, NP 12/20/2018   This appointment was 18 minutes long with over 50% of the time in direct face-to-face patient care, assessment, plan of care, and follow-up.

## 2018-12-20 NOTE — Assessment & Plan Note (Signed)
Plan: Continue CPAP therapy Follow-up in 1 year 

## 2018-12-20 NOTE — Patient Instructions (Signed)
You were seen today by Lauraine Rinne, NP  for:   1. OSA (obstructive sleep apnea)  We recommend that you continue using your CPAP daily >>>Keep up the hard work using your device >>> Goal should be wearing this for the entire night that you are sleeping, at least 4 to 6 hours  Remember:   Do not drive or operate heavy machinery if tired or drowsy.   Please notify the supply company and office if you are unable to use your device regularly due to missing supplies or machine being broken.   Work on maintaining a healthy weight and following your recommended nutrition plan   Maintain proper daily exercise and movement   Maintaining proper use of your device can also help improve management of other chronic illnesses such as: Blood pressure, blood sugars, and weight management.   BiPAP/ CPAP Cleaning:  >>>Clean weekly, with Dawn soap, and bottle brush.  Set up to air dry.    2. Healthcare maintenance  Flu vaccine today  Follow Up:    Return in about 1 year (around 12/20/2019), or if symptoms worsen or fail to improve, for Follow up with Wyn Quaker FNP-C, Follow up with Dr. Ander Slade.   Please do your part to reduce the spread of COVID-19:      Reduce your risk of any infection  and COVID19 by using the similar precautions used for avoiding the common cold or flu:   Wash your hands often with soap and warm water for at least 20 seconds.  If soap and water are not readily available, use an alcohol-based hand sanitizer with at least 60% alcohol.   If coughing or sneezing, cover your mouth and nose by coughing or sneezing into the elbow areas of your shirt or coat, into a tissue or into your sleeve (not your hands).  WEAR A MASK when in public   Avoid shaking hands with others and consider head nods or verbal greetings only.  Avoid touching your eyes, nose, or mouth with unwashed hands.   Avoid close contact with people who are sick.  Avoid places or events with large  numbers of people in one location, like concerts or sporting events.  If you have some symptoms but not all symptoms, continue to monitor at home and seek medical attention if your symptoms worsen.  If you are having a medical emergency, call 911.   [place] / e-Visit: [email or web address]         [facility]: [facility]: [id number]                   [facility]: [id number]     It is flu season:   >>> Best ways to protect herself from the flu: Receive the yearly flu vaccine, practice good hand hygiene washing with soap and also using hand sanitizer when available, eat a nutritious meals, get adequate rest, hydrate appropriately   Please contact the office if your symptoms worsen or you have concerns that you are not improving.   Thank you for choosing [facility] for your healthcare, and for allowing [place] to partner with you on your healthcare journey. I am thankful to be able to provide care to you today.   Wyn Quaker FNP-C    Living With Sleep Apnea Sleep apnea is a condition in which breathing pauses or becomes shallow during sleep. Sleep apnea is most commonly  caused by a collapsed or blocked airway. People with sleep apnea snore loudly and have times when they gasp and stop breathing for 10 seconds or more during sleep. This happens over and over during the night. This disrupts your sleep and keeps your body from getting the rest that it needs, which can cause tiredness and lack of energy (fatigue) during the day. The breaks in breathing also interrupt the deep sleep that you need to feel rested. Even if you do not completely wake up from the gaps in breathing, your sleep may not be restful. You may also have a headache in the morning and low energy during the day, and you may feel anxious or depressed. How can  sleep apnea affect me? Sleep apnea increases your chances of extreme tiredness during the day (daytime fatigue). It can also increase your risk for health conditions, such as:  Heart attack.  Stroke.  Diabetes.  Heart failure.  Irregular heartbeat.  High blood pressure. If you have daytime fatigue as a result of sleep apnea, you may be more likely to:  Perform poorly at school or work.  Fall asleep while driving.  Have difficulty with attention.  Develop depression or anxiety.  Become severely overweight (obese).  Have sexual dysfunction. What actions can I take to manage sleep apnea? Sleep apnea treatment   If you were given a device to open your airway while you sleep, use it only as told by your health care provider. You may be given: ? An oral appliance. This is a custom-made mouthpiece that shifts your lower jaw forward. ? A continuous positive airway pressure (CPAP) device. This device blows air through a mask when you breathe out (exhale). ? A nasal expiratory positive airway pressure (EPAP) device. This device has valves that you put into each nostril. ? A bi-level positive airway pressure (BPAP) device. This device blows air through a mask when you breathe in (inhale) and breathe out (exhale).  You may need surgery if other treatments do not work for you. Sleep habits  Go to sleep and wake up at the same time every day. This helps set your internal clock (circadian rhythm) for sleeping. ? If you stay up later than usual, such as on weekends, try to get up in the morning within 2 hours of your normal wake time.  Try to get at least 7-9 hours of sleep each night.  Stop computer, tablet, and mobile phone use a few hours before bedtime.  Do not take long naps during the day. If you nap, limit it to 30 minutes.  Have a relaxing bedtime routine. Reading or listening to music may relax you and help you sleep.  Use your bedroom only for sleep. ? Keep your  television and computer out of your bedroom. ? Keep your bedroom cool, dark, and quiet. ? Use a supportive mattress and pillows.  Follow your health care provider's instructions for other changes to sleep habits. Nutrition  Do not eat heavy meals in the evening.  Do not have caffeine in the later part of the day. The effects of caffeine can last for more than 5 hours.  Follow your health care provider's or dietitian's instructions for any diet changes. Lifestyle      Do not drink alcohol before bedtime. Alcohol can cause you to fall asleep at first, but then it can cause you to wake up in the middle of the night and have trouble getting back to sleep.  Do not use any  products that contain nicotine or tobacco, such as cigarettes and e-cigarettes. If you need help quitting, ask your health care provider. Medicines  Take over-the-counter and prescription medicines only as told by your health care provider.  Do not use over-the-counter sleep medicine. You can become dependent on this medicine, and it can make sleep apnea worse.  Do not use medicines, such as sedatives and narcotics, unless told by your health care provider. Activity  Exercise on most days, but avoid exercising in the evening. Exercising near bedtime can interfere with sleeping.  If possible, spend time outside every day. Natural light helps regulate your circadian rhythm. General information  Lose weight if you need to, and maintain a healthy weight.  Keep all follow-up visits as told by your health care provider. This is important.  If you are having surgery, make sure to tell your health care provider that you have sleep apnea. You may need to bring your device with you. Where to find more information Learn more about sleep apnea and daytime fatigue from:  American Sleep Association: sleepassociation.[place]: sleepfoundation.org  National Heart, Lung, and Blood Institute:  https://www.hartman-hill.biz/ Summary  Sleep apnea can cause daytime fatigue and other serious health conditions.  Both sleep apnea and daytime fatigue can be bad for your health and well-being.  You may need to wear a device while sleeping to help keep your airway open.  If you are having surgery, make sure to tell your health care provider that you have sleep apnea. You may need to bring your device with you.  Making changes to sleep habits, diet, lifestyle, and activity can help you manage sleep apnea. This information is not intended to replace advice given to you by your health care provider. Make sure you discuss any questions you have with your health care provider. Document Released: 07/06/2017 Document Revised: 08/03/2018 Document Reviewed: 07/06/2017 Elsevier Patient Education  [place].    CPAP and BPAP Information CPAP and BPAP are methods of helping a person breathe with the use of air pressure. CPAP stands for "continuous positive airway pressure." BPAP stands for "bi-level positive airway pressure." In both methods, air is blown through your nose or mouth and into your air passages to help you breathe well. CPAP and BPAP use different amounts of pressure to blow air. With CPAP, the amount of pressure stays the same while you breathe in and out. With BPAP, the amount of pressure is increased when you breathe in (inhale) so that you can take larger breaths. Your health care provider will recommend whether CPAP or BPAP would be more helpful for you. Why are CPAP and BPAP treatments used? CPAP or BPAP can be helpful if you have:  Sleep apnea.  Chronic obstructive pulmonary disease (COPD).  Heart failure.  Medical conditions that weaken the muscles of the chest including muscular dystrophy, or neurological diseases such as amyotrophic lateral sclerosis (ALS).  Other problems that cause breathing to be weak, abnormal, or difficult. CPAP is most commonly used for obstructive sleep  apnea (OSA) to keep the airways from collapsing when the muscles relax during sleep. How is CPAP or BPAP administered? Both CPAP and BPAP are provided by a small machine with a flexible plastic tube that attaches to a plastic mask. You wear the mask. Air is blown through the mask into your nose or mouth. The amount of pressure that is used to blow the air can be adjusted on the machine. Your health care provider will  determine the pressure setting that should be used based on your individual needs. When should CPAP or BPAP be used? In most cases, the mask only needs to be worn during sleep. Generally, the mask needs to be worn throughout the night and during any daytime naps. People with certain medical conditions may also need to wear the mask at other times when they are awake. Follow instructions from your health care provider about when to use the machine. What are some tips for using the mask?   Because the mask needs to be snug, some people feel trapped or closed-in (claustrophobic) when first using the mask. If you feel this way, you may need to get used to the mask. One way to do this is by holding the mask loosely over your nose or mouth and then gradually applying the mask more snugly. You can also gradually increase the amount of time that you use the mask.  Masks are available in various types and sizes. Some fit over your mouth and nose while others fit over just your nose. If your mask does not fit well, talk with your health care provider about getting a different one.  If you are using a mask that fits over your nose and you tend to breathe through your mouth, a chin strap may be applied to help keep your mouth closed.  The CPAP and BPAP machines have alarms that may sound if the mask comes off or develops a leak.  If you have trouble with the mask, it is very important that you talk with your health care provider about finding a way to make the mask easier to tolerate. Do not stop  using the mask. Stopping the use of the mask could have a negative impact on your health. What are some tips for using the machine?  Place your CPAP or BPAP machine on a secure table or stand near an electrical outlet.  Know where the on/off switch is located on the machine.  Follow instructions from your health care provider about how to set the pressure on your machine and when you should use it.  Do not eat or drink while the CPAP or BPAP machine is on. Food or fluids could get pushed into your lungs by the pressure of the CPAP or BPAP.  Do not smoke. Tobacco smoke residue can damage the machine.  For home use, CPAP and BPAP machines can be rented or purchased through home health care companies. Many different brands of machines are available. Renting a machine before purchasing may help you find out which particular machine works well for you.  Keep the CPAP or BPAP machine and attachments clean. Ask your health care provider for specific instructions. Get help right away if:  You have redness or open areas around your nose or mouth where the mask fits.  You have trouble using the CPAP or BPAP machine.  You cannot tolerate wearing the CPAP or BPAP mask.  You have pain, discomfort, and bloating in your abdomen. Summary  CPAP and BPAP are methods of helping a person breathe with the use of air pressure.  Both CPAP and BPAP are provided by a small machine with a flexible plastic tube that attaches to a plastic mask.  If you have trouble with the mask, it is very important that you talk with your health care provider about finding a way to make the mask easier to tolerate. This information is not intended to replace advice given to you by  your health care provider. Make sure you discuss any questions you have with your health care provider. Document Released: 01/08/2004 Document Revised: 08/01/2018 Document Reviewed: 02/29/2016 Elsevier Patient Education  2020 [employer].

## 2019-01-03 ENCOUNTER — Ambulatory Visit: Payer: BC Managed Care – PPO

## 2019-01-24 ENCOUNTER — Other Ambulatory Visit: Payer: Self-pay | Admitting: Internal Medicine

## 2019-01-24 NOTE — Telephone Encounter (Signed)
This medication has not been filled since 2018. Please advise.

## 2019-01-24 NOTE — Telephone Encounter (Signed)
Copied from [place] [id number]. Topic: Quick Communication - Rx Refill/Question >> Jan 24, 2019 12:42 PM Leward Quan A wrote: Medication: fluticasone (FLONASE) 50 MCG/ACT nasal spray   Has the patient contacted their pharmacy? Yes.   (Agent: If no, request that the patient contact the pharmacy for the refill.) (Agent: If yes, when and what did the pharmacy advise?)  Preferred Pharmacy (with phone number or street name): [facility] ([address] [place] [id number] (Phone) [phone] (Fax)    Agent: Please be advised that RX refills may take up to 3 business days. We ask that you follow-up with your pharmacy.

## 2019-01-24 NOTE — Telephone Encounter (Signed)
Requested medication (s) are due for refill today: yes  Requested medication (s) are on the active medication list: yes  Future visit scheduled: yes  Notes to clinic:  Review for refill   Requested Prescriptions  Pending Prescriptions Disp Refills   fluticasone (FLONASE) 50 MCG/ACT nasal spray 16 g 12    Sig: 2 sprays each nostril as needed     Ear, Nose, and Throat: Nasal Preparations - Corticosteroids Passed - 01/24/2019 12:48 PM      Passed - Valid encounter within last 12 months    Recent Outpatient Visits          4 months ago Right ear pain   [place] at [facility], Standley Brooking, MD   10 months ago Visit for preventive health examination   [facility], Standley Brooking, MD   1 year ago Visit for preventive health examination   [place] at [facility], Standley Brooking, MD   3 years ago Cough   [place] at [facility], Standley Brooking, MD   3 years ago Visit for preventive health examination   [place] at [facility], Standley Brooking, MD      Future Appointments            In 1 month [place], Standley Brooking, MD [place] [facility] [place], [facility]

## 2019-01-25 MED ORDER — FLUTICASONE PROPIONATE 50 MCG/ACT NA SUSP: NASAL | 12 refills | Status: DC

## 2019-02-21 ENCOUNTER — Telehealth: Payer: Self-pay | Admitting: Pulmonary Disease

## 2019-02-21 NOTE — Telephone Encounter (Signed)
Called spoke with patient Briana Ferguson see Wyn Quaker in office tomorrow 02/22/19 She has taken tylenol 10/28 for a headache.  She has a cough every day she Briana Ferguson monitor it to see if she can go an hour without coughing.  Told patient if anything changes in her symptoms to call our office before her visit.

## 2019-02-21 NOTE — Telephone Encounter (Signed)
Was last seen by [facility] 11/2018 .  Please set up for visit , in person or virtual with [facility] or [place]

## 2019-02-21 NOTE — Telephone Encounter (Signed)
Called and spoke to pt. Pt states over the last few nights she has been waking up with the sensation of her 'throat closing' and difficulty breathing. Pt will wake up and remove her mask and walk around a bit then go back to bed without the CPAP. Pt concerned about the pressure. Pt requesting a response today. Dr. Ander Slade is unavailable. DL available in [place], [place] Poisson has been informed to print the DL.   Will send to APP of the day.

## 2019-02-22 ENCOUNTER — Other Ambulatory Visit: Payer: Self-pay

## 2019-02-22 ENCOUNTER — Ambulatory Visit (INDEPENDENT_AMBULATORY_CARE_PROVIDER_SITE_OTHER): Payer: BC Managed Care – PPO | Admitting: Pulmonary Disease

## 2019-02-22 ENCOUNTER — Encounter: Payer: Self-pay | Admitting: Pulmonary Disease

## 2019-02-22 DIAGNOSIS — G4733 Obstructive sleep apnea (adult) (pediatric): Secondary | ICD-10-CM | POA: Diagnosis not present

## 2019-02-22 NOTE — Assessment & Plan Note (Addendum)
Plan:  Continue CPAP  >>> change cpap pressure to APAP 6-12 Will order CPAP titration

## 2019-02-22 NOTE — Patient Instructions (Addendum)
You were seen today by Lauraine Rinne, NP  for:   1. OSA (obstructive sleep apnea)   Change CPAP pressure  >>>APAP 6-12  - Cpap titration; Future  We recommend that you continue using your CPAP daily >>>Keep up the hard work using your device >>> Goal should be wearing this for the entire night that you are sleeping, at least 4 to 6 hours  Remember:  . Do not drive or operate heavy machinery if tired or drowsy.  . Please notify the supply company and office if you are unable to use your device regularly due to missing supplies or machine being broken.  . Work on maintaining a healthy weight and following your recommended nutrition plan  . Maintain proper daily exercise and movement  . Maintaining proper use of your device can also help improve management of other chronic illnesses such as: Blood pressure, blood sugars, and weight management.   BiPAP/ CPAP Cleaning:  >>>Clean weekly, with Dawn soap, and bottle brush.  Set up to air dry.   We recommend today:  Orders Placed This Encounter  Procedures  . Cpap titration    Standing Status:   Future    Standing Expiration Date:   02/22/2020    Scheduling Instructions:     Schedule as soon as possible    Order Specific Question:   Where should this test be performed:    Answer:   [facility]   Orders Placed This Encounter  Procedures  . Cpap titration   No orders of the defined types were placed in this encounter.   Follow Up:    Return in about 4 weeks (around 03/22/2019), or if symptoms worsen or fail to improve, for Follow up with Dr. Ander Slade.   Please do your part to reduce the spread of COVID-19:      Reduce your risk of any infection  and COVID19 by using the similar precautions used for avoiding the common cold or flu:  Marland Kitchen Wash your hands often with soap and warm water for at least 20 seconds.  If soap and water are not readily available, use an alcohol-based hand sanitizer with at least 60% alcohol.   . If coughing or sneezing, cover your mouth and nose by coughing or sneezing into the elbow areas of your shirt or coat, into a tissue or into your sleeve (not your hands). Langley Gauss A MASK when in public  . Avoid shaking hands with others and consider head nods or verbal greetings only. . Avoid touching your eyes, nose, or mouth with unwashed hands.  . Avoid close contact with people who are sick. . Avoid places or events with large numbers of people in one location, like concerts or sporting events. . If you have some symptoms but not all symptoms, continue to monitor at home and seek medical attention if your symptoms worsen. . If you are having a medical emergency, call 911.   [place] / e-Visit: [email or web address]         [facility]: [facility]: [id number]                   [facility]: [id number]     It is flu season:   >>> Best ways to protect herself from the flu: Receive the yearly flu vaccine, practice good hand hygiene washing with soap and also using hand sanitizer when available, eat  a nutritious meals, get adequate rest, hydrate appropriately   Please contact the office if your symptoms worsen or you have concerns that you are not improving.   Thank you for choosing [facility] for your healthcare, and for allowing [place] to partner with you on your healthcare journey. I am thankful to be able to provide care to you today.   Wyn Quaker FNP-C

## 2019-02-22 NOTE — Progress Notes (Signed)
Virtual Visit via Telephone Note  I connected with Royetta Asal on 02/22/19 at  3:30 PM EDT by telephone and verified that I am speaking with the correct person using two identifiers.  Location: Patient: Home Provider: [facility] - [id number], [place], [place], [place] [address]   I discussed the limitations, risks, security and privacy concerns of performing an evaluation and management service by telephone and the availability of in person appointments. I also discussed with the patient that there may be a patient responsible charge related to this service. The patient expressed understanding and agreed to proceed.  Patient consented to consult via telephone: Yes People present and their role in pt care: Pt   History of Present Illness:  58 year old female never smoker followed in our office for moderate obstructive sleep apnea  PMH: Obesity, questionable GERD Smoker/ Smoking History: Never smoker Maintenance: None Pt of: Dr. Ander Slade   Chief complaint: CPAP follow-up, cough  58 year old female never smoker followed in our office for obstructive sleep apnea.  Patient scheduled acute visit with our office which was later converted to a televisit due to the patient having to work.   Patient complaining televisit with our office today because she started to have multiple days earlier this week where she was waking up during her CPAP use feeling that her throat was closing and feeling extremely short of breath.  Prior to starting CPAP therapy she coughed all throughout the night.  This subsided initially with CPAP treatment.  CPAP compliance report shows excellent compliance as well as adequately controlled AHI see compliance report listed below:  01/22/2019-02/20/2019- 29 in the last 30 days use, 20 of those days greater than 4 hours, average usage 5 hours and 2 minutes,APAP setting 5-15, 95th percentile 8.9, AHI 0.6  Patient contacted our office over concern of  cough as well as feeling extremely short of breath while sleeping.  Patient denies increased shortness of breath during the day.  She reports that when she slept without the CPAP last night for 4 to 6 hours she did not have the symptoms.  She feels that maybe the pressure needs to be adjusted.  Patient has not had a CPAP titration yet.  Observations/Objective:  10/04/2018-sleep study- AHI 28.8  Assessment and Plan:  OSA (obstructive sleep apnea) Plan:  Continue CPAP  >>> change cpap pressure to APAP 6-12 Will order CPAP titration   Follow Up Instructions:  Return in about 4 weeks (around 03/22/2019), or if symptoms worsen or fail to improve, for Follow up with Dr. Ander Slade.   I discussed the assessment and treatment plan with the patient. The patient was provided an opportunity to ask questions and all were answered. The patient agreed with the plan and demonstrated an understanding of the instructions.   The patient was advised to call back or seek an in-person evaluation if the symptoms worsen or if the condition fails to improve as anticipated.  I provided 23 minutes of non-face-to-face time during this encounter.   Lauraine Rinne, NP

## 2019-03-11 ENCOUNTER — Telehealth: Payer: Self-pay | Admitting: Pulmonary Disease

## 2019-03-11 NOTE — Telephone Encounter (Signed)
I called sleep lab and cancelled the sleep study with Lynnae Sandhoff

## 2019-03-11 NOTE — Telephone Encounter (Signed)
Okay to cancel screening and CPAP titration.Glad that patient is feeling better.Have a recall placed for follow-up with her pulmonologist in 3 months.Wyn Quaker, FNP

## 2019-03-11 NOTE — Telephone Encounter (Signed)
Call returned to patient, confirmed DOB, she states her cpap titration study was ordered due to her feeling like her throat was closing up at night while using her cpap. She states she tried trouble shooting at home and discovered the hot tea she was drinking at night was causing the feeling. She states after d/c the tea that effect/reaction went away. She is thinking that she may have had a reaction to the tea and is wanting to know if the cpap titration study is still necessary. Once this reaction went away she went back on the cpap without the tea and no longer had this reaction.   BM please advise. Thanks.

## 2019-03-11 NOTE — Telephone Encounter (Signed)
Recall placed for pt's follow up in 3 months with AO.  Called and spoke with pt letting her know the info stated by Aaron Edelman and that the cpap titration can be cancelled as well as the covid test which was going to have to be done prior to the cpap titration and pt verbalized understanding.  PCCs, can you please have the titration study and covid test cancelled please.

## 2019-03-15 ENCOUNTER — Other Ambulatory Visit (HOSPITAL_COMMUNITY): Payer: BC Managed Care – PPO

## 2019-03-18 ENCOUNTER — Other Ambulatory Visit: Payer: Self-pay

## 2019-03-18 NOTE — Progress Notes (Signed)
Chief Complaint  Patient presents with  . Annual Exam    pt has no new concerns today    This visit occurred during the SARS-CoV-2 public health emergency.  Safety protocols were in place, including screening questions prior to the visit, additional usage of staff PPE, and extensive cleaning of exam room while observing appropriate contact time as indicated for disinfecting solutions.    HPI: Patient  Briana Ferguson  58 y.o. 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 HIV Screening  03/20/1978  . MAMMOGRAM  04/20/2019  . COLONOSCOPY  02/19/2020  . PAP SMEAR-Modifier  04/24/2021  . TETANUS/TDAP  02/04/2027  . INFLUENZA VACCINE  Completed  . Hepatitis C Screening  Completed   Health Maintenance Review LIFESTYLE:  Exercise:   ocass  Tobacco/ETS: no Alcohol:  none Sugar beverages:  Yes   Had stopped  Sweet tea and ocass  Soft drink. coffee in am .   Sleep:now on cpap    7-8  Drug use: no HH of  5   Mom and min law and 24 yo child Work: sitting   Most  .   40  ocass up.   ROS:  GEN/ HEENT: No fever, significant weight changes sweats headaches vision problems hearing changes, CV/ PULM; No chest pain shortness of breath cough, syncope,edema  change in exercise tolerance. GI /GU: No adominal pain, vomiting, change in bowel habits. No blood in the stool. No significant GU symptoms. SKIN/HEME: ,no acute skin rashes suspicious lesions or bleeding. No lymphadenopathy, nodules, masses.  NEURO/ PSYCH:  No neurologic signs such as weakness numbness. No depression anxiety. IMM/ Allergy: No unusual infections.  Allergy .   REST of 12 system review negative except as per HPI   Past Medical History:  Diagnosis Date  . Attention and concentration deficit  2011   had med trial Dr Candis Schatz ? dx   . Environmental allergies     Past Surgical History:  Procedure Laterality Date  . WISDOM TOOTH EXTRACTION      Family History  Problem Relation Age of Onset  . Diabetes Father   . Stroke Father        deceased  . Heart disease Father   . Sleep apnea Father   . Diabetes Mother   . Stroke Mother   . Sleep apnea Mother   . Hyperlipidemia Other        fhx  . Cancer Other        granfather/liver    Social History   Socioeconomic History  . Marital status: Married    Spouse name: Not on file  . Number of children: Not on file  . Years of education: Not on file  . Highest education level: Not on file  Occupational History  . Occupation: Information systems manager: [employer]  . Financial resource strain: Not on file  . Food insecurity    Worry: Not on file    Inability: Not on file  . Transportation needs    Medical: Not on file    Non-medical: Not on file  Tobacco Use  . Smoking status: Never Smoker  . Smokeless tobacco: Never Used  Substance  and Sexual Activity  . Alcohol use: No    Comment: none  . Drug use: No  . Sexual activity: Not on file  Lifestyle  . Physical activity    Days per week: Not on file    Minutes per session: Not on file  . Stress: Not on file  Relationships  . Social Herbalist on phone: Not on file    Gets together: Not on file    Attends religious service: Not on file    Active member of club or organization: Not on file    Attends meetings of clubs or organizations: Not on file    Relationship status: Not on file  Other Topics Concern  . Not on file  Social History Narrative   [name] degree  May 2011.  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      Has smoke detector and wears seat belts.  No firearms. Stored safely  .Sees dentist regularly . No depression       CB x 3    Husband with vascectomy             Outpatient Medications Prior to Visit  Medication Sig Dispense Refill  . fluticasone (FLONASE) 50 MCG/ACT nasal spray 2 sprays each nostril as needed 16 g 12  . MAGNESIUM PO Take 2,600 mg by mouth as needed.      No facility-administered medications prior to visit.      EXAM:  BP 124/70 (BP Location: Right Arm, Patient Position: Sitting, Cuff Size: Normal)   Pulse 86   Temp 97.7 F (36.5 C) (Temporal)   Ht 5\' 8"  (1.727 m)   Wt 264 lb 9.6 oz (120 kg)   SpO2 96%   BMI 40.23 kg/m   Body mass index is 40.23 kg/m. Wt Readings from Last 3 Encounters:  03/19/19 264 lb 9.6 oz (120 kg)  12/20/18 263 lb (119.3 kg)  10/04/18 255 lb (115.7 kg)    Physical Exam: Vital signs reviewed [id number] is a well-developed well-nourished alert cooperative    who appearsr stated age in no acute distress.  HEENT: normocephalic atraumatic , Eyes: PERRL EOM's full, conjunctiva clear, Nares: paten,t no deformity discharge or tenderness., Ears: no deformity EAC's clear TMs with normal landmarks. Mouth:masked  NECK: supple without masses,  or bruits. Thyroid palp no nodules CHEST/PULM:  Clear to auscultation and percussion breath sounds equal no wheeze , rales or rhonchi. No chest wall deformities or tenderness. Breast: normal by inspection . No dimpling, discharge, masses, tenderness or discharge . CV: PMI is nondisplaced, S1 S2 no gallops, murmurs, rubs. Peripheral pulses are full without delay.No JVD .  ABDOMEN: Bowel sounds normal nontender  No guard or rebound, no hepato splenomegal no CVA tenderness.   Extremtities:  No clubbing cyanosis or edema, no acute joint swelling or redness no focal atrophy NEURO:  Oriented x3, cranial nerves 3-12 appear to be intact, no obvious focal weakness,gait within normal limits no abnormal reflexes or asymmetrical SKIN: No acute rashes normal turgor, color, no bruising  or petechiae. PSYCH: Oriented, good eye contact, no obvious depression anxiety, cognition and judgment appear normal. LN: no cervical axillary inguinal adenopathy  Lab Results  Component Value Date   WBC 4.6 03/14/2018  HGB 13.5 03/14/2018   HCT 40.7 03/14/2018   PLT 306.0 03/14/2018   GLUCOSE 90 03/14/2018   CHOL 172 03/14/2018   TRIG 86.0 03/14/2018   HDL 72.30 03/14/2018   LDLCALC 82 03/14/2018   ALT 14 03/14/2018   AST 14 03/14/2018   NA 138 03/14/2018   K 4.1 03/14/2018   CL 101 03/14/2018   CREATININE 0.88 03/14/2018   BUN 12 03/14/2018   CO2 30 03/14/2018   TSH 1.60 03/14/2018    BP Readings from Last 3 Encounters:  03/19/19 124/70  12/20/18 126/82  08/31/18 136/74    Lab plan reviewed with patient   ASSESSMENT AND PLAN:  Discussed the following assessment and plan:    ICD-10-CM   1. Visit for preventive health examination  123456 Basic metabolic panel    CBC with Differential    Hemoglobin A1c    Hepatic function panel    Lipid panel    TSH  2. BMI 40.0-44.9, adult (HCC)  [id number] Basic metabolic panel    CBC with Differential    Hemoglobin A1c    Hepatic function panel    Lipid panel    TSH  3. OSA (obstructive sleep apnea)  G47.33    Counseled. About bmi  And her back pain will prob improve also less sitting  Patient Care Team: Herminio Kniskern, Standley Brooking, MD as PCP - General Ena Dawley, MD (Obstetrics and Gynecology) Lenis Dickinson, MD (Inactive) as Referring Physician (Neurology) Patient Instructions   Like  Your plan   For weight control.  Will notify you  of labs when available.  And then plan fu or yearly.    Health Maintenance, Female Adopting a healthy lifestyle and getting preventive care are important in promoting health and wellness. Ask your health care provider about:  The right schedule for you to have regular tests and exams.  Things you can do on your own to prevent diseases and keep yourself healthy. What should I know about  diet, weight, and exercise? Eat a healthy diet   Eat a diet that includes plenty of vegetables, fruits, low-fat dairy products, and lean protein.  Do not eat a lot of foods that are high in solid fats, added sugars, or sodium. Maintain a healthy weight Body mass index (BMI) is used to identify weight problems. It estimates body fat based on height and weight. Your health care provider can help determine your BMI and help you achieve or maintain a healthy weight. Get regular exercise Get regular exercise. This is one of the most important things you can do for your health. Most adults should:  Exercise for at least 150 minutes each week. The exercise should increase your heart rate and make you sweat (moderate-intensity exercise).  Do strengthening exercises at least twice a week. This is in addition to the moderate-intensity exercise.  Spend less time sitting. Even light physical activity can be beneficial. Watch cholesterol and blood lipids Have your blood tested for lipids and cholesterol at 56 years of age, then have this test every 5 years. Have your cholesterol levels checked more often if:  Your lipid or cholesterol levels are high.  You are older than 56 years of age.  You are at high risk for heart disease. What should I know about cancer screening? Depending on your health history and family history, you may need to have cancer screening at various ages. This may include screening for:  Breast cancer.  Cervical cancer.  Colorectal cancer.  Skin  cancer.  Lung cancer. What should I know about heart disease, diabetes, and high blood pressure? Blood pressure and heart disease  High blood pressure causes heart disease and increases the risk of stroke. This is more likely to develop in people who have high blood pressure readings, are of African descent, or are overweight.  Have your blood pressure checked: ? Every 3-5 years if you are 14-36 years of age. ? Every year  if you are 29 years old or older. Diabetes Have regular diabetes screenings. This checks your fasting blood sugar level. Have the screening done:  Once every three years after age 7 if you are at a normal weight and have a low risk for diabetes.  More often and at a younger age if you are overweight or have a high risk for diabetes. What should I know about preventing infection? Hepatitis B If you have a higher risk for hepatitis B, you should be screened for this virus. Talk with your health care provider to find out if you are at risk for hepatitis B infection. Hepatitis C Testing is recommended for:  Everyone born from [birth year] through 1963.  Anyone with known risk factors for hepatitis C. Sexually transmitted infections (STIs)  Get screened for STIs, including gonorrhea and chlamydia, if: ? You are sexually active and are younger than 56 years of age. ? You are older than 56 years of age and your health care provider tells you that you are at risk for this type of infection. ? Your sexual activity has changed since you were last screened, and you are at increased risk for chlamydia or gonorrhea. Ask your health care provider if you are at risk.  Ask your health care provider about whether you are at high risk for HIV. Your health care provider may recommend a prescription medicine to help prevent HIV infection. If you choose to take medicine to prevent HIV, you should first get tested for HIV. You should then be tested every 3 months for as long as you are taking the medicine. Pregnancy  If you are about to stop having your period (premenopausal) and you may become pregnant, seek counseling before you get pregnant.  Take 400 to 800 micrograms (mcg) of folic acid every day if you become pregnant.  Ask for birth control (contraception) if you want to prevent pregnancy. Osteoporosis and menopause Osteoporosis is a disease in which the bones lose minerals and strength with aging. This  can result in bone fractures. If you are 29 years old or older, or if you are at risk for osteoporosis and fractures, ask your health care provider if you should:  Be screened for bone loss.  Take a calcium or vitamin D supplement to lower your risk of fractures.  Be given hormone replacement therapy (HRT) to treat symptoms of menopause. Follow these instructions at home: Lifestyle  Do not use any products that contain nicotine or tobacco, such as cigarettes, e-cigarettes, and chewing tobacco. If you need help quitting, ask your health care provider.  Do not use street drugs.  Do not share needles.  Ask your health care provider for help if you need support or information about quitting drugs. Alcohol use  Do not drink alcohol if: ? Your health care provider tells you not to drink. ? You are pregnant, may be pregnant, or are planning to become pregnant.  If you drink alcohol: ? Limit how much you use to 0-1 drink a day. ? Limit intake if you are  breastfeeding.  Be aware of how much alcohol is in your drink. In the [place] one drink equals one 12 oz bottle of beer (355 mL), one 5 oz glass of wine (148 mL), or one 1 oz glass of hard liquor (44 mL). General instructions  Schedule regular health, dental, and eye exams.  Stay current with your vaccines.  Tell your health care provider if: ? You often feel depressed. ? You have ever been abused or do not feel safe at home. Summary  Adopting a healthy lifestyle and getting preventive care are important in promoting health and wellness.  Follow your health care provider's instructions about healthy diet, exercising, and getting tested or screened for diseases.  Follow your health care provider's instructions on monitoring your cholesterol and blood pressure. This information is not intended to replace advice given to you by your health care provider. Make sure you discuss any questions you have with your health care provider.  Document Released: 10/25/2010 Document Revised: 04/04/2018 Document Reviewed: 04/04/2018 Elsevier Patient Education  2020 [place] Malacai Grantz M.D.

## 2019-03-19 ENCOUNTER — Ambulatory Visit (INDEPENDENT_AMBULATORY_CARE_PROVIDER_SITE_OTHER): Payer: BC Managed Care – PPO | Admitting: Internal Medicine

## 2019-03-19 ENCOUNTER — Encounter: Payer: Self-pay | Admitting: Internal Medicine

## 2019-03-19 VITALS — BP 124/70 | HR 86 | Temp 97.7°F | Ht 68.0 in | Wt 264.6 lb

## 2019-03-19 DIAGNOSIS — Z0000 Encounter for general adult medical examination without abnormal findings: Secondary | ICD-10-CM | POA: Diagnosis not present

## 2019-03-19 DIAGNOSIS — Z6841 Body Mass Index (BMI) 40.0 and over, adult: Secondary | ICD-10-CM

## 2019-03-19 DIAGNOSIS — G4733 Obstructive sleep apnea (adult) (pediatric): Secondary | ICD-10-CM

## 2019-03-19 LAB — CBC WITH DIFFERENTIAL/PLATELET
Basophils Absolute: 0 10*3/uL (ref 0.0–0.1)
Basophils Relative: 0.7 % (ref 0.0–3.0)
Eosinophils Absolute: 0.1 10*3/uL (ref 0.0–0.7)
Eosinophils Relative: 1.3 % (ref 0.0–5.0)
HCT: 41 % (ref 36.0–46.0)
Hemoglobin: 13.4 g/dL (ref 12.0–15.0)
Lymphocytes Relative: 37.5 % (ref 12.0–46.0)
Lymphs Abs: 1.7 10*3/uL (ref 0.7–4.0)
MCHC: 32.6 g/dL (ref 30.0–36.0)
MCV: 87.2 fl (ref 78.0–100.0)
Monocytes Absolute: 0.4 10*3/uL (ref 0.1–1.0)
Monocytes Relative: 9.5 % (ref 3.0–12.0)
Neutro Abs: 2.3 10*3/uL (ref 1.4–7.7)
Neutrophils Relative %: 51 % (ref 43.0–77.0)
Platelets: 306 10*3/uL (ref 150.0–400.0)
RBC: 4.7 Mil/uL (ref 3.87–5.11)
RDW: 13.8 % (ref 11.5–15.5)
WBC: 4.6 10*3/uL (ref 4.0–10.5)

## 2019-03-19 LAB — LIPID PANEL
Cholesterol: 177 mg/dL (ref 0–200)
HDL: 68.5 mg/dL (ref >39.00)
LDL Cholesterol: 89 mg/dL (ref 0–99)
NonHDL: 108.83
Total CHOL/HDL Ratio: 3
Triglycerides: 97 mg/dL (ref 0.0–149.0)
VLDL: 19.4 mg/dL (ref 0.0–40.0)

## 2019-03-19 LAB — HEMOGLOBIN A1C: Hgb A1c MFr Bld: 6.5 % (ref 4.6–6.5)

## 2019-03-19 LAB — BASIC METABOLIC PANEL
BUN: 11 mg/dL (ref 6–23)
CO2: 30 mEq/L (ref 19–32)
Calcium: 9.4 mg/dL (ref 8.4–10.5)
Chloride: 102 mEq/L (ref 96–112)
Creatinine, Ser: 0.87 mg/dL (ref 0.40–1.20)
GFR: 81.5 mL/min (ref >60.00)
Glucose, Bld: 99 mg/dL (ref 70–99)
Potassium: 4.2 mEq/L (ref 3.5–5.1)
Sodium: 138 mEq/L (ref 135–145)

## 2019-03-19 LAB — TSH: TSH: 1.34 u[IU]/mL (ref 0.35–4.50)

## 2019-03-19 LAB — HEPATIC FUNCTION PANEL
ALT: 19 U/L (ref 0–35)
AST: 18 U/L (ref 0–37)
Albumin: 4.1 g/dL (ref 3.5–5.2)
Alkaline Phosphatase: 87 U/L (ref 39–117)
Bilirubin, Direct: 0.1 mg/dL (ref 0.0–0.3)
Total Bilirubin: 0.6 mg/dL (ref 0.2–1.2)
Total Protein: 7.6 g/dL (ref 6.0–8.3)

## 2019-03-19 NOTE — Patient Instructions (Addendum)
Like  Your plan   For weight control.  Will notify you  of labs when available.  And then plan fu or yearly.    Health Maintenance, Female Adopting a healthy lifestyle and getting preventive care are important in promoting health and wellness. Ask your health care provider about:  The right schedule for you to have regular tests and exams.  Things you can do on your own to prevent diseases and keep yourself healthy. What should I know about diet, weight, and exercise? Eat a healthy diet   Eat a diet that includes plenty of vegetables, fruits, low-fat dairy products, and lean protein.  Do not eat a lot of foods that are high in solid fats, added sugars, or sodium. Maintain a healthy weight Body mass index (BMI) is used to identify weight problems. It estimates body fat based on height and weight. Your health care provider can help determine your BMI and help you achieve or maintain a healthy weight. Get regular exercise Get regular exercise. This is one of the most important things you can do for your health. Most adults should:  Exercise for at least 150 minutes each week. The exercise should increase your heart rate and make you sweat (moderate-intensity exercise).  Do strengthening exercises at least twice a week. This is in addition to the moderate-intensity exercise.  Spend less time sitting. Even light physical activity can be beneficial. Watch cholesterol and blood lipids Have your blood tested for lipids and cholesterol at 56 years of age, then have this test every 5 years. Have your cholesterol levels checked more often if:  Your lipid or cholesterol levels are high.  You are older than 56 years of age.  You are at high risk for heart disease. What should I know about cancer screening? Depending on your health history and family history, you may need to have cancer screening at various ages. This may include screening for:  Breast cancer.  Cervical cancer.   Colorectal cancer.  Skin cancer.  Lung cancer. What should I know about heart disease, diabetes, and high blood pressure? Blood pressure and heart disease  High blood pressure causes heart disease and increases the risk of stroke. This is more likely to develop in people who have high blood pressure readings, are of African descent, or are overweight.  Have your blood pressure checked: ? Every 3-5 years if you are 56-34 years of age. ? Every year if you are 56 years old or older. Diabetes Have regular diabetes screenings. This checks your fasting blood sugar level. Have the screening done:  Once every three years after age 23 if you are at a normal weight and have a low risk for diabetes.  More often and at a younger age if you are overweight or have a high risk for diabetes. What should I know about preventing infection? Hepatitis B If you have a higher risk for hepatitis B, you should be screened for this virus. Talk with your health care provider to find out if you are at risk for hepatitis B infection. Hepatitis C Testing is recommended for:  Everyone born from [birth year] through 1963.  Anyone with known risk factors for hepatitis C. Sexually transmitted infections (STIs)  Get screened for STIs, including gonorrhea and chlamydia, if: ? You are sexually active and are younger than 56 years of age. ? You are older than 56 years of age and your health care provider tells you that you are at risk for this type of  infection. ? Your sexual activity has changed since you were last screened, and you are at increased risk for chlamydia or gonorrhea. Ask your health care provider if you are at risk.  Ask your health care provider about whether you are at high risk for HIV. Your health care provider may recommend a prescription medicine to help prevent HIV infection. If you choose to take medicine to prevent HIV, you should first get tested for HIV. You should then be tested every 3 months for  as long as you are taking the medicine. Pregnancy  If you are about to stop having your period (premenopausal) and you may become pregnant, seek counseling before you get pregnant.  Take 400 to 800 micrograms (mcg) of folic acid every day if you become pregnant.  Ask for birth control (contraception) if you want to prevent pregnancy. Osteoporosis and menopause Osteoporosis is a disease in which the bones lose minerals and strength with aging. This can result in bone fractures. If you are 56 years old or older, or if you are at risk for osteoporosis and fractures, ask your health care provider if you should:  Be screened for bone loss.  Take a calcium or vitamin D supplement to lower your risk of fractures.  Be given hormone replacement therapy (HRT) to treat symptoms of menopause. Follow these instructions at home: Lifestyle  Do not use any products that contain nicotine or tobacco, such as cigarettes, e-cigarettes, and chewing tobacco. If you need help quitting, ask your health care provider.  Do not use street drugs.  Do not share needles.  Ask your health care provider for help if you need support or information about quitting drugs. Alcohol use  Do not drink alcohol if: ? Your health care provider tells you not to drink. ? You are pregnant, may be pregnant, or are planning to become pregnant.  If you drink alcohol: ? Limit how much you use to 0-1 drink a day. ? Limit intake if you are breastfeeding.  Be aware of how much alcohol is in your drink. In the [place] one drink equals one 12 oz bottle of beer (355 mL), one 5 oz glass of wine (148 mL), or one 1 oz glass of hard liquor (44 mL). General instructions  Schedule regular health, dental, and eye exams.  Stay current with your vaccines.  Tell your health care provider if: ? You often feel depressed. ? You have ever been abused or do not feel safe at home. Summary  Adopting a healthy lifestyle and getting preventive  care are important in promoting health and wellness.  Follow your health care provider's instructions about healthy diet, exercising, and getting tested or screened for diseases.  Follow your health care provider's instructions on monitoring your cholesterol and blood pressure. This information is not intended to replace advice given to you by your health care provider. Make sure you discuss any questions you have with your health care provider. Document Released: 10/25/2010 Document Revised: 04/04/2018 Document Reviewed: 04/04/2018 Elsevier Patient Education  2020 [employer].

## 2019-05-02 ENCOUNTER — Telehealth: Payer: Self-pay | Admitting: Internal Medicine

## 2019-05-03 ENCOUNTER — Other Ambulatory Visit: Payer: Self-pay

## 2019-05-06 ENCOUNTER — Ambulatory Visit: Payer: BC Managed Care – PPO | Admitting: Internal Medicine

## 2019-05-06 ENCOUNTER — Encounter: Payer: Self-pay | Admitting: Internal Medicine

## 2019-05-06 VITALS — BP 120/70 | HR 72 | Temp 97.6°F | Ht 68.0 in | Wt 266.2 lb

## 2019-05-06 DIAGNOSIS — R002 Palpitations: Secondary | ICD-10-CM

## 2019-05-06 DIAGNOSIS — I498 Other specified cardiac arrhythmias: Secondary | ICD-10-CM

## 2019-05-06 DIAGNOSIS — G4733 Obstructive sleep apnea (adult) (pediatric): Secondary | ICD-10-CM | POA: Diagnosis not present

## 2019-05-06 NOTE — Progress Notes (Signed)
This visit occurred during the SARS-CoV-2 public health emergency.  Safety protocols were in place, including screening questions prior to the visit, additional usage of staff PPE, and extensive cleaning of exam room while observing appropriate contact time as indicated for disinfecting solutions.    Chief Complaint  Patient presents with  . Fluttering in chest while wearing mask    HPI: Briana Ferguson 59 y.o. come in for Fluttering after wearing  mask  10 - 15 minutes     And takes it off .  Thinks from mask .  After  Visiting facility    30 minutes   Notices  After  gong home. Last minutes 10 - 15? No assoc sx such as cp sob  Falling syncop  Under rx for OSA  cpap   And  Much less   Sleepy.   Last ekg 2015  ROS: See pertinent positives and negatives per HPI. No bleeding  Falling exercise change  No nocturnal sx   Past Medical History:  Diagnosis Date  . Attention and concentration deficit 2011   had med trial Dr Candis Schatz ? dx   . Environmental allergies     Family History  Problem Relation Age of Onset  . Diabetes Father   . Stroke Father        deceased  . Heart disease Father   . Sleep apnea Father   . Diabetes Mother   . Stroke Mother   . Sleep apnea Mother   . Hyperlipidemia Other        fhx  . Cancer Other        granfather/liver    Social History   Socioeconomic History  . Marital status: Married    Spouse name: Not on file  . Number of children: Not on file  . Years of education: Not on file  . Highest education level: Not on file  Occupational History  . Occupation: Automotive engineer    Employer: [employer]  Tobacco Use  . Smoking status: Never Smoker  . Smokeless tobacco: Never Used  Substance and Sexual Activity  . Alcohol use: No    Comment: none  . Drug use: No  . Sexual activity: Not on file  Other Topics Concern  . Not on file  Social History Narrative   [name] degree  May 2011.  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      Has smoke detector and wears seat belts.  No firearms. Stored safely .Sees dentist regularly . No depression       CB x 3    Husband with vascectomy            Social Determinants of Health   Financial Resource Strain:   . Difficulty of Paying Living Expenses: Not on file  Food Insecurity:   . Worried About Charity fundraiser in the Last Year: Not on file  . Ran Out of Food in the Last Year: Not on file  Transportation Needs:   . Lack of Transportation (Medical): Not on file  . Lack of Transportation (Non-Medical): Not on file  Physical Activity:   . Days of Exercise per Week: Not on file  . Minutes of Exercise per Session: Not on file  Stress:   . Feeling of Stress : Not on file  Social Connections:   . Frequency of Communication with Friends and Family: Not on file  . Frequency of Social Gatherings with Friends and Family: Not on file  . Attends Religious Services: Not on file  . Active Member of Clubs or Organizations: Not on file  . Attends Archivist Meetings: Not on file  . Marital Status: Not on file    Outpatient Medications Prior to Visit  Medication Sig Dispense Refill  . fluticasone (FLONASE) 50 MCG/ACT nasal spray 2 sprays each nostril as needed 16 g 12  . MAGNESIUM PO Take 2,600 mg by mouth as needed.      No facility-administered medications prior to visit.     EXAM:  BP 120/70 (BP Location: Left Arm, Patient Position: Sitting, Cuff Size: Large)   Pulse 72   Temp 97.6 F (36.4 C) (Temporal)   Ht 5\' 8"  (1.727 m)   Wt 266 lb 4 oz (120.8 kg)   SpO2 97%   BMI 40.48 kg/m   Body mass index is 40.48 kg/m.  GENERAL: vitals reviewed and listed above, alert, oriented, appears well hydrated and in no acute distress HEENT: atraumatic, conjunctiva  clear, no obvious abnormalities on inspection of external nose and  ears OP : masked  NECK: no obvious masses on inspection palpation  LUNGS: clear to auscultation bilaterally, no wheezes, rales or rhonchi, good air movement CV: HRRR, no clubbing cyanosis or  peripheral edema nl cap refill  Abdomen:  Sof,t normal bowel sounds without hepatosplenomegaly, no guarding rebound or masses no CVA tenderness MS: moves all extremities without noticeable focal  abnormality PSYCH: pleasant and cooperative, no obvious depression or anxiety Lab Results  Component Value Date   WBC 4.6 03/19/2019   HGB 13.4 03/19/2019   HCT 41.0 03/19/2019   PLT 306.0 03/19/2019   GLUCOSE 99 03/19/2019   CHOL 177 03/19/2019   TRIG 97.0 03/19/2019   HDL 68.50 03/19/2019   LDLCALC 89 03/19/2019   ALT 19 03/19/2019   AST 18 03/19/2019   NA 138 03/19/2019   K 4.2 03/19/2019   CL 102 03/19/2019   CREATININE 0.87 03/19/2019   BUN 11 03/19/2019   CO2 30 03/19/2019   TSH 1.34 03/19/2019   HGBA1C 6.5 03/19/2019   BP Readings from Last 3 Encounters:  05/06/19 120/70  03/19/19 124/70  12/20/18 126/82   EKG NSR nl intervals noted no acute findings  ASSESSMENT AND PLAN:  Discussed the following assessment and plan:  Periodic heart flutter - Plan: EKG 12-Lead  Intermittent palpitations - Plan: EKG 12-Lead  Heart palpitations  OSA (obstructive sleep apnea) on cpap Does not sound tachy or  Assoc sx  ? Premature beats   Transient  Will get echo and follow   Up if  persistent or progressive if echo is ok then follow otherwise consider monitoring   But exam and hx reassuring today  -Patient advised to return or notify health care team  if  new concerns arise.  Patient Instructions   You exam is normal and  EKG is normal .  Will order a echo cardiogram and if ok then follow.  If ongoing  Long lasting or  persistent we can get cardiology to see you for consider ation of monitoring .    If get any other symptoms of concern then  Let us know     Palpitations Palpitations are  feelings that your heartbeat is irregular or  is faster than normal. It may feel like your heart is fluttering or skipping a beat. Palpitations are usually not a serious problem. They may be caused by many things, including smoking, caffeine, alcohol, stress, and certain medicines or drugs. Most causes of palpitations are not serious. However, some palpitations can be a sign of a serious problem. You may need further tests to rule out serious medical problems. Follow these instructions at home:     Pay attention to any changes in your condition. Take these actions to help manage your symptoms: Eating and drinking  Avoid foods and drinks that may cause palpitations. These may include: ? Caffeinated coffee, tea, soft drinks, diet pills, and energy drinks. ? Chocolate. ? Alcohol. Lifestyle  Take steps to reduce your stress and anxiety. Things that can help you relax include: ? Yoga. ? Mind-body activities, such as deep breathing, meditation, or using words and images to create positive thoughts (guided imagery). ? Physical activity, such as swimming, jogging, or walking. Tell your health care provider if your palpitations increase with activity. If you have chest pain or shortness of breath with activity, do not continue the activity until you are seen by your health care provider. ? Biofeedback. This is a method that helps you learn to use your mind to control things in your body, such as your heartbeat.  Do not use drugs, including cocaine or ecstasy. Do not use marijuana.  Get plenty of rest and sleep. Keep a regular bed time. General instructions  Take over-the-counter and prescription medicines only as told by your health care provider.  Do not use any products that contain nicotine or tobacco, such as cigarettes and e-cigarettes. If you need help quitting, ask your health care provider.  Keep all follow-up visits as told by your health care provider. This is important. These may  include visits for further testing if palpitations do not go away or get worse. Contact a health care provider if you:  Continue to have a fast or irregular heartbeat after 24 hours.  Notice that your palpitations occur more often. Get help right away if you:  Have chest pain or shortness of breath.  Have a severe headache.  Feel dizzy or you faint. Summary  Palpitations are feelings that your heartbeat is irregular or is faster than normal. It may feel like your heart is fluttering or skipping a beat.  Palpitations may be caused by many things, including smoking, caffeine, alcohol, stress, certain medicines, and drugs.  Although most causes of palpitations are not serious, some causes can be a sign of a serious medical problem.  Get help right away if you faint or have chest pain, shortness of breath, a severe headache, or dizziness. This information is not intended to replace advice given to you by your health care provider. Make sure you discuss any questions you have with your health care provider. Document Revised: 05/24/2017 Document Reviewed: 05/24/2017 Elsevier Patient Education  2020 [place] Samarrah Tranchina M.D.

## 2019-05-06 NOTE — Patient Instructions (Addendum)
  You exam is normal and  EKG is normal .  Will order a echo cardiogram and if ok then follow.  If ongoing  Long lasting or  persistent we can get cardiology to see you for consider ation of monitoring .    If get any other symptoms of concern then  Let us know     Palpitations Palpitations are feelings that your heartbeat is irregular or is faster than normal. It may feel like your heart is fluttering or skipping a beat. Palpitations are usually not a serious problem. They may be caused by many things, including smoking, caffeine, alcohol, stress, and certain medicines or drugs. Most causes of palpitations are not serious. However, some palpitations can be a sign of a serious problem. You may need further tests to rule out serious medical problems. Follow these instructions at home:     Pay attention to any changes in your condition. Take these actions to help manage your symptoms: Eating and drinking  Avoid foods and drinks that may cause palpitations. These may include: ? Caffeinated coffee, tea, soft drinks, diet pills, and energy drinks. ? Chocolate. ? Alcohol. Lifestyle  Take steps to reduce your stress and anxiety. Things that can help you relax include: ? Yoga. ? Mind-body activities, such as deep breathing, meditation, or using words and images to create positive thoughts (guided imagery). ? Physical activity, such as swimming, jogging, or walking. Tell your health care provider if your palpitations increase with activity. If you have chest pain or shortness of breath with activity, do not continue the activity until you are seen by your health care provider. ? Biofeedback. This is a method that helps you learn to use your mind to control things in your body, such as your heartbeat.  Do not use drugs, including cocaine or ecstasy. Do not use marijuana.  Get plenty of rest and sleep. Keep a regular bed time. General instructions  Take over-the-counter and prescription  medicines only as told by your health care provider.  Do not use any products that contain nicotine or tobacco, such as cigarettes and e-cigarettes. If you need help quitting, ask your health care provider.  Keep all follow-up visits as told by your health care provider. This is important. These may include visits for further testing if palpitations do not go away or get worse. Contact a health care provider if you:  Continue to have a fast or irregular heartbeat after 24 hours.  Notice that your palpitations occur more often. Get help right away if you:  Have chest pain or shortness of breath.  Have a severe headache.  Feel dizzy or you faint. Summary  Palpitations are feelings that your heartbeat is irregular or is faster than normal. It may feel like your heart is fluttering or skipping a beat.  Palpitations may be caused by many things, including smoking, caffeine, alcohol, stress, certain medicines, and drugs.  Although most causes of palpitations are not serious, some causes can be a sign of a serious medical problem.  Get help right away if you faint or have chest pain, shortness of breath, a severe headache, or dizziness. This information is not intended to replace advice given to you by your health care provider. Make sure you discuss any questions you have with your health care provider. Document Revised: 05/24/2017 Document Reviewed: 05/24/2017 Elsevier Patient Education  2020 [employer].

## 2019-05-07 ENCOUNTER — Other Ambulatory Visit: Payer: Self-pay

## 2019-05-07 ENCOUNTER — Ambulatory Visit (INDEPENDENT_AMBULATORY_CARE_PROVIDER_SITE_OTHER): Payer: BC Managed Care – PPO | Admitting: Family Medicine

## 2019-05-07 ENCOUNTER — Encounter (INDEPENDENT_AMBULATORY_CARE_PROVIDER_SITE_OTHER): Payer: Self-pay | Admitting: Family Medicine

## 2019-05-07 VITALS — BP 121/76 | HR 86 | Temp 98.1°F | Ht 68.0 in | Wt 261.0 lb

## 2019-05-07 DIAGNOSIS — Z0289 Encounter for other administrative examinations: Secondary | ICD-10-CM

## 2019-05-07 DIAGNOSIS — R5383 Other fatigue: Secondary | ICD-10-CM | POA: Diagnosis not present

## 2019-05-07 DIAGNOSIS — Z1331 Encounter for screening for depression: Secondary | ICD-10-CM

## 2019-05-07 DIAGNOSIS — R739 Hyperglycemia, unspecified: Secondary | ICD-10-CM | POA: Diagnosis not present

## 2019-05-07 DIAGNOSIS — Z6839 Body mass index (BMI) 39.0-39.9, adult: Secondary | ICD-10-CM

## 2019-05-07 DIAGNOSIS — E559 Vitamin D deficiency, unspecified: Secondary | ICD-10-CM

## 2019-05-07 DIAGNOSIS — Z9189 Other specified personal risk factors, not elsewhere classified: Secondary | ICD-10-CM

## 2019-05-07 DIAGNOSIS — R0602 Shortness of breath: Secondary | ICD-10-CM

## 2019-05-07 DIAGNOSIS — G4733 Obstructive sleep apnea (adult) (pediatric): Secondary | ICD-10-CM

## 2019-05-07 DIAGNOSIS — E66812 Obesity, class 2: Secondary | ICD-10-CM

## 2019-05-08 LAB — LIPID PANEL WITH LDL/HDL RATIO
Cholesterol, Total: 159 mg/dL (ref 100–199)
HDL: 63 mg/dL (ref >39)
LDL Chol Calc (NIH): 81 mg/dL (ref 0–99)
LDL/HDL Ratio: 1.3 ratio (ref 0.0–3.2)
Triglycerides: 80 mg/dL (ref 0–149)
VLDL Cholesterol Cal: 15 mg/dL (ref 5–40)

## 2019-05-08 LAB — HEMOGLOBIN A1C
Est. average glucose Bld gHb Est-mCnc: 131 mg/dL
Hgb A1c MFr Bld: 6.2 % — ABNORMAL HIGH (ref 4.8–5.6)

## 2019-05-08 LAB — COMPREHENSIVE METABOLIC PANEL
ALT: 14 IU/L (ref 0–32)
AST: 16 IU/L (ref 0–40)
Albumin/Globulin Ratio: 1.4 (ref 1.2–2.2)
Albumin: 4.2 g/dL (ref 3.8–4.9)
Alkaline Phosphatase: 95 IU/L (ref 39–117)
BUN/Creatinine Ratio: 13 (ref 9–23)
BUN: 12 mg/dL (ref 6–24)
Bilirubin Total: 0.4 mg/dL (ref 0.0–1.2)
CO2: 24 mmol/L (ref 20–29)
Calcium: 9.2 mg/dL (ref 8.7–10.2)
Chloride: 99 mmol/L (ref 96–106)
Creatinine, Ser: 0.94 mg/dL (ref 0.57–1.00)
GFR calc Af Amer: 78 mL/min/{1.73_m2} (ref >59)
GFR calc non Af Amer: 68 mL/min/{1.73_m2} (ref >59)
Globulin, Total: 3.1 g/dL (ref 1.5–4.5)
Glucose: 105 mg/dL — ABNORMAL HIGH (ref 65–99)
Potassium: 4.3 mmol/L (ref 3.5–5.2)
Sodium: 138 mmol/L (ref 134–144)
Total Protein: 7.3 g/dL (ref 6.0–8.5)

## 2019-05-08 LAB — CBC WITH DIFFERENTIAL/PLATELET
Basophils Absolute: 0 10*3/uL (ref 0.0–0.2)
Basos: 0 %
EOS (ABSOLUTE): 0 10*3/uL (ref 0.0–0.4)
Eos: 1 %
Hematocrit: 41 % (ref 34.0–46.6)
Hemoglobin: 13.3 g/dL (ref 11.1–15.9)
Immature Grans (Abs): 0 10*3/uL (ref 0.0–0.1)
Immature Granulocytes: 0 %
Lymphocytes Absolute: 1.7 10*3/uL (ref 0.7–3.1)
Lymphs: 32 %
MCH: 28.2 pg (ref 26.6–33.0)
MCHC: 32.4 g/dL (ref 31.5–35.7)
MCV: 87 fL (ref 79–97)
Monocytes Absolute: 0.6 10*3/uL (ref 0.1–0.9)
Monocytes: 11 %
Neutrophils Absolute: 2.9 10*3/uL (ref 1.4–7.0)
Neutrophils: 56 %
Platelets: 325 10*3/uL (ref 150–450)
RBC: 4.71 x10E6/uL (ref 3.77–5.28)
RDW: 13 % (ref 11.7–15.4)
WBC: 5.2 10*3/uL (ref 3.4–10.8)

## 2019-05-08 LAB — T4, FREE: Free T4: 1.24 ng/dL (ref 0.82–1.77)

## 2019-05-08 LAB — VITAMIN D 25 HYDROXY (VIT D DEFICIENCY, FRACTURES): Vit D, 25-Hydroxy: 37.2 ng/mL (ref 30.0–100.0)

## 2019-05-08 LAB — INSULIN, RANDOM: INSULIN: 18.9 u[IU]/mL (ref 2.6–24.9)

## 2019-05-08 LAB — T3: T3, Total: 127 ng/dL (ref 71–180)

## 2019-05-08 LAB — FOLATE: Folate: 16.9 ng/mL (ref >3.0)

## 2019-05-08 LAB — TSH: TSH: 2.11 u[IU]/mL (ref 0.450–4.500)

## 2019-05-08 LAB — VITAMIN B12: Vitamin B-12: 777 pg/mL (ref 232–1245)

## 2019-05-08 NOTE — Progress Notes (Signed)
Chief Complaint:   OBESITY Briana Ferguson (MR# [id number]) is a 59 y.o. female who presents for evaluation and treatment of obesity and related comorbidities. Current BMI is Body mass index is 39.68 kg/m. Happy has been struggling with her weight for many years and has been unsuccessful in either losing weight, maintaining weight loss, or reaching her healthy weight goal.  [name] states she is currently in the action stage of change and ready to dedicate time achieving and maintaining a healthier weight. [name] is interested in becoming our patient and working on intensive lifestyle modifications including (but not limited to) diet and exercise for weight loss.  [name]'s habits were reviewed today and are as follows: Her family eats meals together occasionally, she thinks her family will eat healthier with her, her desired weight loss is 175 lbs, she has been heavy most of her life, she started gaining weight during her 1st pregnancy in 1988 to 1989, her heaviest weight ever was 264 lbs pounds, she has significant food cravings issues, she snacks frequently in the evenings, she skips meals frequently, she is frequently drinking liquids with calories, she frequently makes poor food choices, she frequently eats larger portions than normal, she has binge eating behaviors and she struggles with emotional eating.  Depression Screen [name]'s Food and Mood (modified PHQ-9) score was 15.  Depression screen [facility] 2/9 05/07/2019  Decreased Interest 3  Down, Depressed, Hopeless 1  PHQ - 2 Score 4  Altered sleeping 3  Tired, decreased energy 3  Change in appetite 1  Feeling bad or failure about yourself  1  Trouble concentrating 2  Moving slowly or fidgety/restless 1  Suicidal thoughts 0  PHQ-9 Score 15  Difficult doing work/chores Not difficult at all   Subjective:   1. Other fatigue Glennisha feels her energy is lower than it should be. This has worsened with weight gain and has not worsened  recently. [name] admits to daytime somnolence and admits to waking up still tired. Patient is at risk for obstructive sleep apnea. Patient has a history of sleep apnea. Patient generally gets 5.5 or 6.5 hours of sleep per night, and states they generally have restless sleep. Snoring is present. Apneic episodes are present. Epworth Sleepiness Score is 13.  2. SOB (shortness of breath) on exertion [name] notes increasing shortness of breath with exercising and seems to be worsening over time with weight gain. She notes getting out of breath sooner with activity than she used to. This has not gotten worse recently. [name] denies shortness of breath at rest or orthopnea.  3. Hyperglycemia [name] has a history of some elevated blood glucose readings without a diagnosis of diabetes. [name] has a recent A1c of 6.5 at her PCP's office. She admits to polyphagia.  4. Vitamin D deficiency [name]'s Vitamin D last level was 29.17 on 04/11/18. She is currently taking OTC vit D. She notes fatigue and denies nausea, vomiting or muscle weakness.  5. Obstructive sleep apnea syndrome [name] is on a CPAP machines and is doing well and sleeping better.  6. At risk for diabetes mellitus [name] is at higher than average risk for developing diabetes due to her hyperglycemia and obesity.   Assessment/Plan:   1. Other fatigue [name] was informed fatigue may be related to obesity, depression, or many other causes. Labs will be ordered, and in the meanwhile, [name] has agreed to work on diet, exercise, and weight loss.  - Lipid Panel With LDL/HDL Ratio - T3 -  TSH - T4, free - B12 - Folate  2. SOB (shortness of breath) on exertion [name]'s shortness of breath appears to be obesity related and exercise induced. She has agreed to work on weight loss and gradually increase exercise to treat her exercise induced shortness of breath. Will continue to monitor closely.  - Comprehensive Metabolic Panel (CMET) - CBC  w/Diff/Platelet - HgB A1c - Insulin, random - Lipid Panel With LDL/HDL Ratio - T3 - TSH - T4, free - B12 - Folate  3. Hyperglycemia Fasting labs will be obtained and results with be discussed with [name] [name] in 2 weeks at her follow up visit. In the meanwhile, [name] was started on a lower simple carbohydrate diet and will work on weight loss efforts. We will follow her progress.  - Comprehensive Metabolic Panel (CMET) - CBC w/Diff/Platelet - HgB A1c - Insulin, random - Lipid Panel With LDL/HDL Ratio  4. Vitamin D deficiency Low Vitamin D level contributes to fatigue and are associated with obesity, breast, and colon cancer. Labs were ordered and he will follow-up for routine testing of vitamin D, at least 2-3 times per year to avoid over-replacement.  - Vitamin D (25 hydroxy)  5. Obstructive sleep apnea syndrome [name] agrees to continue using the CPAP machine. She will work on weight loss and hopefully be able to eventually discontinue using the CPAP.  6. Depression screening [name] had a positive depression screening. Depression is commonly associated with obesity and often results in emotional eating behaviors. We will monitor this closely and work on CBT to help improve the non-hunger eating patterns. Referral to Psychology may be required if no improvement is seen as she continues in our clinic.  7. At risk for diabetes mellitus [name] was given approximately 15 minutes of diabetes education and counseling today. We discussed intensive lifestyle modifications today with an emphasis on weight loss as well as increasing exercise and decreasing simple carbohydrates in her diet. We also reviewed medication options with an emphasis on risk versus benefit of those discussed.   8. Class 2 severe obesity with serious comorbidity and body mass index (BMI) of 39.0 to 39.9 in adult, unspecified obesity type (HCC)  [name] is currently in the action stage of change and her goal is to continue with  weight loss efforts. I recommend [name] begin the structured treatment plan as follows:  She has agreed to on the Category 2 Plan.   For substantial health benefits, adults should do at least 150 minutes (2 hours and 30 minutes) a week of moderate-intensity, or 75 minutes (1 hour and 15 minutes) a week of vigorous-intensity aerobic physical activity, or an equivalent combination of moderate- and vigorous-intensity aerobic activity. Aerobic activity should be performed in episodes of at least 10 minutes, and preferably, it should be spread throughout the week. Adults should also include muscle-strengthening activities that involve all major muscle groups on 2 or more days a week. We discussed the following behavioral modification strategies today: increasing lean protein intake, meal planning and cooking strategies and dealing with family or coworker sabotage.  She was informed of the importance of frequent follow-up visits to maximize her success with intensive lifestyle modifications for her multiple health conditions. She was informed we would discuss her lab results at her next visit unless there is a critical issue that needs to be addressed sooner. Crystie agreed to keep her next visit at the agreed upon time to discuss these results.  Objective:   Blood pressure 121/76, pulse 86, temperature 98.1  F (36.7 C), temperature source Oral, height 5\' 8"  (1.727 m), weight 261 lb (118.4 kg), last menstrual period 04/03/2011, SpO2 95 %. Body mass index is 39.68 kg/m.   Indirect Calorimeter completed today shows a VO2 of 353 and a REE of 2455.  Her calculated basal metabolic rate is 123456 thus her basal metabolic rate is better than expected.  General: Cooperative, alert, well developed, in no acute distress. HEENT: Conjunctivae and lids unremarkable. Neck: No thyromegaly.  Cardiovascular: Regular rhythm.  Lungs: Normal work of breathing. Extremities: No edema.  Neurologic: No focal deficits.   Lab  Results  Component Value Date   CREATININE 0.94 05/07/2019   BUN 12 05/07/2019   NA 138 05/07/2019   K 4.3 05/07/2019   CL 99 05/07/2019   CO2 24 05/07/2019   Lab Results  Component Value Date   ALT 14 05/07/2019   AST 16 05/07/2019   ALKPHOS 95 05/07/2019   BILITOT 0.4 05/07/2019   Lab Results  Component Value Date   HGBA1C 6.2 (H) 05/07/2019   HGBA1C 6.5 03/19/2019   Lab Results  Component Value Date   INSULIN 18.9 05/07/2019   Lab Results  Component Value Date   TSH 2.110 05/07/2019   Lab Results  Component Value Date   CHOL 159 05/07/2019   HDL 63 05/07/2019   LDLCALC 81 05/07/2019   TRIG 80 05/07/2019   CHOLHDL 3 03/19/2019   Lab Results  Component Value Date   WBC 5.2 05/07/2019   HGB 13.3 05/07/2019   HCT 41.0 05/07/2019   MCV 87 05/07/2019   PLT 325 05/07/2019    Attestation Statements:   Reviewed by clinician on day of visit: allergies, medications, problem list, medical history, surgical history, family history, social history, and previous encounter notes.  IMarcille Blanco, CMA, am acting as transcriptionist for Starlyn Skeans, MD   I have reviewed the above documentation for accuracy and completeness, and I agree with the above. - Dennard Nip, MD

## 2019-05-13 ENCOUNTER — Other Ambulatory Visit: Payer: Self-pay

## 2019-05-13 ENCOUNTER — Ambulatory Visit (HOSPITAL_COMMUNITY): Payer: BC Managed Care – PPO | Attending: Cardiovascular Disease

## 2019-05-13 DIAGNOSIS — R002 Palpitations: Secondary | ICD-10-CM | POA: Diagnosis not present

## 2019-05-13 DIAGNOSIS — G4733 Obstructive sleep apnea (adult) (pediatric): Secondary | ICD-10-CM | POA: Insufficient documentation

## 2019-05-13 DIAGNOSIS — I498 Other specified cardiac arrhythmias: Secondary | ICD-10-CM | POA: Diagnosis not present

## 2019-05-13 NOTE — Progress Notes (Signed)
Echo result is normal  very reassuring   will follow if having  progressive  prolonged  sx then plan FU or see cardiology but for now.  Observation seems the best path

## 2019-05-14 LAB — HM PAP SMEAR

## 2019-05-16 ENCOUNTER — Encounter: Payer: Self-pay | Admitting: Internal Medicine

## 2019-05-18 ENCOUNTER — Ambulatory Visit: Payer: BC Managed Care – PPO | Attending: Internal Medicine

## 2019-05-18 DIAGNOSIS — Z23 Encounter for immunization: Secondary | ICD-10-CM | POA: Insufficient documentation

## 2019-05-20 ENCOUNTER — Encounter (INDEPENDENT_AMBULATORY_CARE_PROVIDER_SITE_OTHER): Payer: Self-pay | Admitting: Family Medicine

## 2019-05-20 ENCOUNTER — Ambulatory Visit (INDEPENDENT_AMBULATORY_CARE_PROVIDER_SITE_OTHER): Payer: BC Managed Care – PPO | Admitting: Family Medicine

## 2019-05-20 ENCOUNTER — Other Ambulatory Visit: Payer: Self-pay

## 2019-05-20 VITALS — BP 137/77 | HR 97 | Temp 98.6°F | Ht 68.0 in | Wt 257.0 lb

## 2019-05-20 DIAGNOSIS — Z6839 Body mass index (BMI) 39.0-39.9, adult: Secondary | ICD-10-CM

## 2019-05-20 DIAGNOSIS — Z9189 Other specified personal risk factors, not elsewhere classified: Secondary | ICD-10-CM | POA: Diagnosis not present

## 2019-05-20 DIAGNOSIS — E559 Vitamin D deficiency, unspecified: Secondary | ICD-10-CM | POA: Diagnosis not present

## 2019-05-20 DIAGNOSIS — E119 Type 2 diabetes mellitus without complications: Secondary | ICD-10-CM

## 2019-05-20 MED ORDER — METFORMIN HCL 500 MG PO TABS: 500.0000 mg | ORAL_TABLET | Freq: Every day | ORAL | 0 refills | Status: DC

## 2019-05-20 MED ORDER — VITAMIN D (ERGOCALCIFEROL) 1.25 MG (50000 UNIT) PO CAPS: 50000.0000 [IU] | ORAL_CAPSULE | ORAL | 0 refills | Status: DC

## 2019-05-21 ENCOUNTER — Ambulatory Visit (INDEPENDENT_AMBULATORY_CARE_PROVIDER_SITE_OTHER): Payer: Self-pay | Admitting: Family Medicine

## 2019-05-21 NOTE — Progress Notes (Signed)
Chief Complaint:   OBESITY Briana Ferguson is here to discuss her progress with her obesity treatment plan along with follow-up of her obesity related diagnoses. Briana Ferguson is on the Category 2 Plan and states she is following her eating plan approximately 80% of the time. Briana Ferguson states she is exercising 0 minutes 0 times per week.  Today's visit was #: 2 Starting weight: 261 lbs Starting date: 05/07/2019 Today's weight: 257 lbs Today's date: 05/20/2019 Total lbs lost to date: 4 Total lbs lost since last in-office visit: 4  Interim History: Briana Ferguson did well with weight loss on her Category 2 plan. She states her hunger was mostly controlled and she was able to meet most of her nutrition goals.  Subjective:   Vitamin D deficiency. [name] is on OTC Vitamin D 1,000 a day, but her level is not yet at goal. She does report fatigue.  Type 2 diabetes mellitus without complication, without long-term current use of insulin ([place]). Briana Ferguson has an A1c of 6.5 and a very strong family history of diabetes mellitus. She has been trying to improve with diet and her A1c is better but she has struggled with polyphagia, especially in the p.m.  Lab Results  Component Value Date   HGBA1C 6.2 (H) 05/07/2019   HGBA1C 6.5 03/19/2019   Lab Results  Component Value Date   LDLCALC 81 05/07/2019   CREATININE 0.94 05/07/2019   Lab Results  Component Value Date   INSULIN 18.9 05/07/2019   At risk for nausea. Briana Ferguson is at risk for nausea due to metformin, making sure to take with food.  Assessment/Plan:   Vitamin D deficiency. Low Vitamin D level contributes to fatigue and are associated with obesity, breast, and colon cancer. She agrees to start taking prescription Vitamin D, Ergocalciferol, (DRISDOL) 1.25 MG (50000 UNIT) CAPS capsule #4 with 0 refills and will continue OTC Vitamin D. She will follow-up for routine testing of Vitamin D, at least 2-3 times per year to avoid over-replacement.   Type 2 diabetes  mellitus without complication, without long-term current use of insulin ([place]). Good blood sugar control is important to decrease the likelihood of diabetic complications such as nephropathy, neuropathy, limb loss, blindness, coronary artery disease, and death. Intensive lifestyle modification including diet, exercise and weight loss are the first line of treatment for diabetes. Briana Ferguson will start metFORMIN (GLUCOPHAGE) 500 MG tablet QAM #30 and will continue her diet.  At risk for nausea. Briana Ferguson was given approximately 30 minutes of nausea prevention counseling today. Briana Ferguson is at risk for nausea due to her new or current medication. She was encouraged to titrate her medication slowly, make sure to stay hydrated, eat smaller portions throughout the day, and avoid high fat meals.   Class 2 severe obesity with serious comorbidity and body mass index (BMI) of 39.0 to 39.9 in adult, unspecified obesity type ([place]).  Briana Ferguson is currently in the action stage of change. As such, her goal is to continue with weight loss efforts. She has agreed to the Category 2 Plan.   Behavioral modification strategies: increasing lean protein intake and decreasing simple carbohydrates.  Briana Ferguson has agreed to follow-up with our clinic in 2 weeks. She was informed of the importance of frequent follow-up visits to maximize her success with intensive lifestyle modifications for her multiple health conditions.   Objective:   Blood pressure 137/77, pulse 97, temperature 98.6 F (37 C), temperature source Oral, height 5\' 8"  (1.727 m), weight 257 lb (116.6  kg), last menstrual period 04/03/2011, SpO2 97 %. Body mass index is 39.08 kg/m.  General: Cooperative, alert, well developed, in no acute distress. HEENT: Conjunctivae and lids unremarkable. Cardiovascular: Regular rhythm.  Lungs: Normal work of breathing. Neurologic: No focal deficits.   Lab Results  Component Value Date   CREATININE 0.94 05/07/2019   BUN 12  05/07/2019   NA 138 05/07/2019   K 4.3 05/07/2019   CL 99 05/07/2019   CO2 24 05/07/2019   Lab Results  Component Value Date   ALT 14 05/07/2019   AST 16 05/07/2019   ALKPHOS 95 05/07/2019   BILITOT 0.4 05/07/2019   Lab Results  Component Value Date   HGBA1C 6.2 (H) 05/07/2019   HGBA1C 6.5 03/19/2019   Lab Results  Component Value Date   INSULIN 18.9 05/07/2019   Lab Results  Component Value Date   TSH 2.110 05/07/2019   Lab Results  Component Value Date   CHOL 159 05/07/2019   HDL 63 05/07/2019   LDLCALC 81 05/07/2019   TRIG 80 05/07/2019   CHOLHDL 3 03/19/2019   Lab Results  Component Value Date   WBC 5.2 05/07/2019   HGB 13.3 05/07/2019   HCT 41.0 05/07/2019   MCV 87 05/07/2019   PLT 325 05/07/2019   No results found for: IRON, TIBC, FERRITIN  Attestation Statements:   Reviewed by clinician on day of visit: allergies, medications, problem list, medical history, surgical history, family history, social history, and previous encounter notes.  I, Michaelene Song, am acting as Location manager for Dennard Nip, MD   I have reviewed the above documentation for accuracy and completeness, and I agree with the above. -  Dennard Nip, MD

## 2019-05-30 ENCOUNTER — Ambulatory Visit: Payer: BC Managed Care – PPO | Attending: Internal Medicine

## 2019-05-30 DIAGNOSIS — Z20822 Contact with and (suspected) exposure to covid-19: Secondary | ICD-10-CM

## 2019-05-31 ENCOUNTER — Encounter (INDEPENDENT_AMBULATORY_CARE_PROVIDER_SITE_OTHER): Payer: Self-pay | Admitting: Family Medicine

## 2019-05-31 LAB — NOVEL CORONAVIRUS, NAA: SARS-CoV-2, NAA: NOT DETECTED

## 2019-06-03 ENCOUNTER — Other Ambulatory Visit: Payer: Self-pay

## 2019-06-03 ENCOUNTER — Encounter (INDEPENDENT_AMBULATORY_CARE_PROVIDER_SITE_OTHER): Payer: Self-pay | Admitting: Family Medicine

## 2019-06-03 ENCOUNTER — Ambulatory Visit (INDEPENDENT_AMBULATORY_CARE_PROVIDER_SITE_OTHER): Payer: BC Managed Care – PPO | Admitting: Family Medicine

## 2019-06-03 VITALS — BP 108/69 | HR 74 | Temp 97.9°F | Ht 68.0 in | Wt 255.0 lb

## 2019-06-03 DIAGNOSIS — Z6838 Body mass index (BMI) 38.0-38.9, adult: Secondary | ICD-10-CM

## 2019-06-03 DIAGNOSIS — E119 Type 2 diabetes mellitus without complications: Secondary | ICD-10-CM | POA: Diagnosis not present

## 2019-06-03 DIAGNOSIS — E559 Vitamin D deficiency, unspecified: Secondary | ICD-10-CM | POA: Diagnosis not present

## 2019-06-03 MED ORDER — VITAMIN D (ERGOCALCIFEROL) 1.25 MG (50000 UNIT) PO CAPS: 50000.0000 [IU] | ORAL_CAPSULE | ORAL | 0 refills | Status: DC

## 2019-06-03 MED ORDER — METFORMIN HCL 500 MG PO TABS: 500.0000 mg | ORAL_TABLET | Freq: Every day | ORAL | 0 refills | Status: DC

## 2019-06-03 NOTE — Telephone Encounter (Signed)
Please advise,  patient was negative.

## 2019-06-04 NOTE — Progress Notes (Signed)
Chief Complaint:   OBESITY Briana Ferguson is here to discuss her progress with her obesity treatment plan along with follow-up of her obesity related diagnoses. Briana Ferguson is on the Category 2 Plan and states she is following her eating plan approximately 90% of the time. Banner states she is doing 0 minutes 0 times per week.  Today's visit was #: 3 Starting weight: 261 lbs Starting date: 05/07/2019 Today's weight: 255 lbs Today's date: 06/03/2019 Total lbs lost to date: 6 Total lbs lost since last in-office visit: 2  Interim History: Briana Ferguson continues to do well with weight loss on her Category 2 plan. She misses eating out and she is getting bored with her breakfast options.  Subjective:   1. Vitamin D deficiency Briana Ferguson is stable on Vit D and she denies nausea, vomiting, or muscle weakness. Last Vit D level is not yet at goal.  2. Type 2 diabetes mellitus without complication, without long-term current use of insulin (Briana Ferguson) Briana Ferguson is stable on metformin, and she is doing well with diet and exercise.  Assessment/Plan:   1. Vitamin D deficiency Low Vitamin D level contributes to fatigue and are associated with obesity, breast, and colon cancer. We will refill prescription Vitamin D for 1 month. Briana Ferguson will follow-up for routine testing of Vitamin D, at least 2-3 times per year to avoid over-replacement.  - Vitamin D, Ergocalciferol, (DRISDOL) 1.25 MG (50000 UNIT) CAPS capsule; Take 1 capsule (50,000 Units total) by mouth every 7 (seven) days.  Dispense: 4 capsule; Refill: 0  2. Type 2 diabetes mellitus without complication, without long-term current use of insulin (HCC) Good blood sugar control is important to decrease the likelihood of diabetic complications such as nephropathy, neuropathy, limb loss, blindness, coronary artery disease, and death. Intensive lifestyle modification including diet, exercise and weight loss are the first line of treatment for diabetes. We will refill metformin for 1  month, and we will continue monitor.  - metFORMIN (GLUCOPHAGE) 500 MG tablet; Take 1 tablet (500 mg total) by mouth daily with breakfast.  Dispense: 30 tablet; Refill: 0  3. Class 2 severe obesity with serious comorbidity and body mass index (BMI) of 38.0 to 38.9 in adult, unspecified obesity type (HCC) Briana Ferguson is currently in the action stage of change. As such, her goal is to continue with weight loss efforts. She has agreed to the Category 2 Plan and keeping a food journal and adhering to recommended goals of 250-400 calories and 25+ grams of protein at breakfast daily.   Behavioral modification strategies: increasing lean protein intake.  Briana Ferguson has agreed to follow-up with our clinic in 2 to 3 weeks with Dr. Juleen China. She was informed of the importance of frequent follow-up visits to maximize her success with intensive lifestyle modifications for her multiple health conditions.   Objective:   Blood pressure 108/69, pulse 74, temperature 97.9 F (36.6 C), temperature source Oral, height 5\' 8"  (1.727 m), weight 255 lb (115.7 kg), last menstrual period 04/03/2011, SpO2 99 %. Body mass index is 38.77 kg/m.  General: Cooperative, alert, well developed, in no acute distress. HEENT: Conjunctivae and lids unremarkable. Cardiovascular: Regular rhythm.  Lungs: Normal work of breathing. Neurologic: No focal deficits.   Lab Results  Component Value Date   CREATININE 0.94 05/07/2019   BUN 12 05/07/2019   NA 138 05/07/2019   K 4.3 05/07/2019   CL 99 05/07/2019   CO2 24 05/07/2019   Lab Results  Component Value Date   ALT 14 05/07/2019  AST 16 05/07/2019   ALKPHOS 95 05/07/2019   BILITOT 0.4 05/07/2019   Lab Results  Component Value Date   HGBA1C 6.2 (H) 05/07/2019   HGBA1C 6.5 03/19/2019   Lab Results  Component Value Date   INSULIN 18.9 05/07/2019   Lab Results  Component Value Date   TSH 2.110 05/07/2019   Lab Results  Component Value Date   CHOL 159 05/07/2019   HDL 63  05/07/2019   LDLCALC 81 05/07/2019   TRIG 80 05/07/2019   CHOLHDL 3 03/19/2019   Lab Results  Component Value Date   WBC 5.2 05/07/2019   HGB 13.3 05/07/2019   HCT 41.0 05/07/2019   MCV 87 05/07/2019   PLT 325 05/07/2019   No results found for: IRON, TIBC, FERRITIN  Attestation Statements:   Reviewed by clinician on day of visit: allergies, medications, problem list, medical history, surgical history, family history, social history, and previous encounter notes.   I, Trixie Dredge, am acting as transcriptionist for Dennard Nip, MD.  I have reviewed the above documentation for accuracy and completeness, and I agree with the above. -  Dennard Nip, MD

## 2019-06-15 ENCOUNTER — Ambulatory Visit: Payer: Self-pay

## 2019-06-17 ENCOUNTER — Encounter (INDEPENDENT_AMBULATORY_CARE_PROVIDER_SITE_OTHER): Payer: Self-pay | Admitting: Family Medicine

## 2019-06-17 ENCOUNTER — Other Ambulatory Visit: Payer: Self-pay

## 2019-06-17 ENCOUNTER — Ambulatory Visit (INDEPENDENT_AMBULATORY_CARE_PROVIDER_SITE_OTHER): Payer: BC Managed Care – PPO | Admitting: Family Medicine

## 2019-06-17 VITALS — BP 137/75 | HR 64 | Temp 98.1°F | Ht 68.0 in | Wt 251.0 lb

## 2019-06-17 DIAGNOSIS — E119 Type 2 diabetes mellitus without complications: Secondary | ICD-10-CM | POA: Diagnosis not present

## 2019-06-17 DIAGNOSIS — Z6838 Body mass index (BMI) 38.0-38.9, adult: Secondary | ICD-10-CM

## 2019-06-17 DIAGNOSIS — E559 Vitamin D deficiency, unspecified: Secondary | ICD-10-CM | POA: Diagnosis not present

## 2019-06-17 DIAGNOSIS — E538 Deficiency of other specified B group vitamins: Secondary | ICD-10-CM | POA: Diagnosis not present

## 2019-06-17 DIAGNOSIS — Z9189 Other specified personal risk factors, not elsewhere classified: Secondary | ICD-10-CM

## 2019-06-17 MED ORDER — VITAMIN D (ERGOCALCIFEROL) 1.25 MG (50000 UNIT) PO CAPS: 50000.0000 [IU] | ORAL_CAPSULE | ORAL | 0 refills | Status: DC

## 2019-06-17 NOTE — Progress Notes (Signed)
Chief Complaint:   OBESITY Briana Ferguson is here to discuss her progress with her obesity treatment plan along with follow-up of her obesity related diagnoses. Briana Ferguson is on the Category 2 Plan and states she is following her eating plan approximately 90% of the time. Briana Ferguson states she is exercising for 0 minutes 0 times per week.  Today's visit was #: 4 Starting weight: 261 lbs Starting date: 05/07/2019 Today's weight: 251 lbs Today's date: 06/17/2019 Total lbs lost to date: 10 lbs Total lbs lost since last in-office visit: 4 lbs  Interim History: Briana Ferguson's husband is also a patient here at [facility].  She works hard to eat on plan.  She says she made good decisions during the power outage.  She is having no issues with hunger.  Subjective:   1. Vitamin D deficiency Briana Ferguson's Vitamin D level was 37.2 on 05/07/2019. She is currently taking vit D. She denies nausea, vomiting or muscle weakness.  2. Type 2 diabetes mellitus without complication, without long-term current use of insulin (HCC) Medications reviewed. Briana Ferguson is taking metformin 500 mg daily.  Lab Results  Component Value Date   HGBA1C 6.2 (H) 05/07/2019   HGBA1C 6.5 03/19/2019   Lab Results  Component Value Date   LDLCALC 81 05/07/2019   CREATININE 0.94 05/07/2019   Lab Results  Component Value Date   INSULIN 18.9 05/07/2019   3. Vitamin B12 deficiency She is not a vegetarian.  She does not have a previous diagnosis of pernicious anemia.  She does not have a history of weight loss surgery.   Lab Results  Component Value Date   [id number] 05/07/2019   4. At risk for heart disease Briana Ferguson is at a higher than average risk for cardiovascular disease due to obesity. Reviewed: no chest pain on exertion, no dyspnea on exertion, and no swelling of ankles.  Assessment/Plan:   1. Vitamin D deficiency Low Vitamin D level contributes to fatigue and are associated with obesity, breast, and colon cancer. She agrees to continue to take  prescription Vitamin D @50 ,000 IU every week and will follow-up for routine testing of Vitamin D, at least 2-3 times per year to avoid over-replacement.  Orders - Vitamin D, Ergocalciferol, (DRISDOL) 1.25 MG (50000 UNIT) CAPS capsule; Take 1 capsule (50,000 Units total) by mouth every 7 (seven) days.  Dispense: 4 capsule; Refill: 0  2. Type 2 diabetes mellitus without complication, without long-term current use of insulin (HCC) Good blood sugar control is important to decrease the likelihood of diabetic complications such as nephropathy, neuropathy, limb loss, blindness, coronary artery disease, and death. Intensive lifestyle modification including diet, exercise and weight loss are the first line of treatment for diabetes.   3. Vitamin B12 deficiency The diagnosis was reviewed with the patient. Counseling provided today, see below. We will continue to monitor. Orders and follow up as documented in patient record.  Counseling . The body needs vitamin B12: to make red blood cells; to make DNA; and to help the nerves work properly so they can carry messages from the brain to the body.  . The main causes of vitamin B12 deficiency include dietary deficiency, digestive diseases, pernicious anemia, and having a surgery in which part of the stomach or small intestine is removed.  . Certain medicines can make it harder for the body to absorb vitamin B12. These medicines include: heartburn medications; some antibiotics; some medications used to treat diabetes, gout, and high cholesterol.  . In some cases, there are  no symptoms of this condition. If the condition leads to anemia or nerve damage, various symptoms can occur, such as weakness or fatigue, shortness of breath, and numbness or tingling in your hands and feet.   . Treatment:  o May include taking vitamin B12 supplements.  o Avoid alcohol.  o Eat lots of healthy foods that contain vitamin B12: - Beef, pork, chicken, [place], and organ meats, such  as liver.  - Seafood: This includes clams, rainbow trout, salmon, tuna, and haddock. Eggs.  - Cereal and dairy products that are fortified: This means that vitamin B12 has been added to the food.   4. At risk for heart disease Briana Ferguson was given approximately 15 minutes of coronary artery disease prevention counseling today. She is 59 y.o. female and has risk factors for heart disease including obesity. We discussed intensive lifestyle modifications today with an emphasis on specific weight loss instructions and strategies.   Repetitive spaced learning was employed today to elicit superior memory formation and behavioral change.  5. Class 2 severe obesity with serious comorbidity and body mass index (BMI) of 38.0 to 38.9 in adult, unspecified obesity type (HCC) Briana Ferguson is currently in the action stage of change. As such, her goal is to continue with weight loss efforts. She has agreed to the Category 2 Plan.   Exercise goals: She will work on fun/enjoyable routine  Behavioral modification strategies: increasing lean protein intake and increasing water intake.  Briana Ferguson has agreed to follow-up with our clinic in 2 weeks. She was informed of the importance of frequent follow-up visits to maximize her success with intensive lifestyle modifications for her multiple health conditions.   Objective:   Blood pressure 137/75, pulse 64, temperature 98.1 F (36.7 C), temperature source Oral, height 5\' 8"  (1.727 m), weight 251 lb (113.9 kg), last menstrual period 04/03/2011, SpO2 98 %. Body mass index is 38.16 kg/m.  General: Cooperative, alert, well developed, in no acute distress. HEENT: Conjunctivae and lids unremarkable. Cardiovascular: Regular rhythm.  Lungs: Normal work of breathing. Neurologic: No focal deficits.   Lab Results  Component Value Date   CREATININE 0.94 05/07/2019   BUN 12 05/07/2019   NA 138 05/07/2019   K 4.3 05/07/2019   CL 99 05/07/2019   CO2 24 05/07/2019   Lab Results    Component Value Date   ALT 14 05/07/2019   AST 16 05/07/2019   ALKPHOS 95 05/07/2019   BILITOT 0.4 05/07/2019   Lab Results  Component Value Date   HGBA1C 6.2 (H) 05/07/2019   HGBA1C 6.5 03/19/2019   Lab Results  Component Value Date   INSULIN 18.9 05/07/2019   Lab Results  Component Value Date   TSH 2.110 05/07/2019   Lab Results  Component Value Date   CHOL 159 05/07/2019   HDL 63 05/07/2019   LDLCALC 81 05/07/2019   TRIG 80 05/07/2019   CHOLHDL 3 03/19/2019   Lab Results  Component Value Date   WBC 5.2 05/07/2019   HGB 13.3 05/07/2019   HCT 41.0 05/07/2019   MCV 87 05/07/2019   PLT 325 05/07/2019   Attestation Statements:   Reviewed by clinician on day of visit: allergies, medications, problem list, medical history, surgical history, family history, social history, and previous encounter notes.  I, Water quality scientist, CMA, am acting as Location manager for [employer], DO.  I have reviewed the above documentation for accuracy and completeness, and I agree with the above. Briscoe Deutscher, DO

## 2019-06-22 ENCOUNTER — Ambulatory Visit: Payer: BC Managed Care – PPO | Attending: Internal Medicine

## 2019-06-22 DIAGNOSIS — Z23 Encounter for immunization: Secondary | ICD-10-CM

## 2019-06-22 NOTE — Progress Notes (Signed)
   Covid-19 Vaccination Clinic  Name:  Briana Ferguson    MRN: [id number] DOB: [birth year]  06/22/2019  Briana Ferguson was observed post Covid-19 immunization for 15 minutes without incidence. She was provided with Vaccine Information Sheet and instruction to access the V-Safe system.   Briana Ferguson was instructed to call 911 with any severe reactions post vaccine: Marland Kitchen Difficulty breathing  . Swelling of your face and throat  . A fast heartbeat  . A bad rash all over your body  . Dizziness and weakness    Immunizations Administered    Name Date Dose VIS Date Route   Moderna COVID-19 Vaccine 06/22/2019  9:21 AM 0.5 mL 03/26/2019 Intramuscular   Manufacturer: Moderna   Lot: [id number]   [id number]

## 2019-07-03 ENCOUNTER — Ambulatory Visit (INDEPENDENT_AMBULATORY_CARE_PROVIDER_SITE_OTHER): Payer: BC Managed Care – PPO | Admitting: Family Medicine

## 2019-07-03 ENCOUNTER — Encounter (INDEPENDENT_AMBULATORY_CARE_PROVIDER_SITE_OTHER): Payer: Self-pay | Admitting: Family Medicine

## 2019-07-03 ENCOUNTER — Other Ambulatory Visit: Payer: Self-pay

## 2019-07-03 VITALS — BP 125/74 | HR 74 | Temp 98.1°F | Ht 68.0 in | Wt 248.0 lb

## 2019-07-03 DIAGNOSIS — Z6837 Body mass index (BMI) 37.0-37.9, adult: Secondary | ICD-10-CM

## 2019-07-03 DIAGNOSIS — Z9989 Dependence on other enabling machines and devices: Secondary | ICD-10-CM

## 2019-07-03 DIAGNOSIS — Z9189 Other specified personal risk factors, not elsewhere classified: Secondary | ICD-10-CM | POA: Diagnosis not present

## 2019-07-03 DIAGNOSIS — E559 Vitamin D deficiency, unspecified: Secondary | ICD-10-CM

## 2019-07-03 DIAGNOSIS — G4733 Obstructive sleep apnea (adult) (pediatric): Secondary | ICD-10-CM

## 2019-07-03 DIAGNOSIS — E1169 Type 2 diabetes mellitus with other specified complication: Secondary | ICD-10-CM | POA: Diagnosis not present

## 2019-07-03 MED ORDER — VITAMIN D (ERGOCALCIFEROL) 1.25 MG (50000 UNIT) PO CAPS: 50000.0000 [IU] | ORAL_CAPSULE | ORAL | 0 refills | Status: DC

## 2019-07-03 NOTE — Progress Notes (Signed)
Chief Complaint:   OBESITY Briana Ferguson is here to discuss her progress with her obesity treatment plan along with follow-up of her obesity related diagnoses. Briana Ferguson is on the Category 2 Plan and states she is following her eating plan approximately 90% of the time. Briana Ferguson states she is exercising for 0 minutes 0 times per week.  Today's visit was #: 5 Starting weight: 261 lbs Starting date: 05/07/2019 Today's weight: 248 lbs Today's date: 07/03/2019 Total lbs lost to date: 13 lbs Total lbs lost since last in-office visit: 3 lbs  Interim History: Briana Ferguson says she is not always getting in all her protein.  She enjoyed some fried fish the other night, but she is generally on plan.  She has some back and knee pain.  She is taking 1/4 cup olive oil for pain.  Subjective:   1. Vitamin D deficiency Briana Ferguson's Vitamin D level was 37.2 on 05/07/2019. She is currently taking vit D. She denies nausea, vomiting or muscle weakness.  2. OSA on CPAP Briana Ferguson has a diagnosis of sleep apnea. She reports that she is using a CPAP regularly.   3. Type 2 diabetes mellitus without complication, without long-term current use of insulin (HCC) Medications reviewed. She is taking metformin 500 mg daily.  Lab Results  Component Value Date   HGBA1C 6.2 (H) 05/07/2019   HGBA1C 6.5 03/19/2019   Lab Results  Component Value Date   LDLCALC 81 05/07/2019   CREATININE 0.94 05/07/2019   Lab Results  Component Value Date   INSULIN 18.9 05/07/2019   4. At risk for heart disease Briana Ferguson is at a higher than average risk for cardiovascular disease due to obesity. Reviewed: no chest pain on exertion, no dyspnea on exertion, and no swelling of ankles.  Assessment/Plan:   1. Vitamin D deficiency Low Vitamin D level contributes to fatigue and are associated with obesity, breast, and colon cancer. She agrees to continue to take prescription Vitamin D @50 ,000 IU every week and will follow-up for routine testing of Vitamin D, at  least 2-3 times per year to avoid over-replacement.  Orders - Vitamin D, Ergocalciferol, (DRISDOL) 1.25 MG (50000 UNIT) CAPS capsule; Take 1 capsule (50,000 Units total) by mouth every 7 (seven) days.  Dispense: 4 capsule; Refill: 0  2. OSA on CPAP Intensive lifestyle modifications are the first line treatment for this issue. We discussed several lifestyle modifications today and she will continue to work on diet, exercise and weight loss efforts. We will continue to monitor. Orders and follow up as documented in patient record.   Counseling  Sleep apnea is a condition in which breathing pauses or becomes shallow during sleep. This happens over and over during the night. This disrupts your sleep and keeps your body from getting the rest that it needs, which can cause tiredness and lack of energy (fatigue) during the day.  Sleep apnea treatment: If you were given a device to open your airway while you sleep, USE IT!  Sleep hygiene:   Limit or avoid alcohol, caffeinated beverages, and cigarettes, especially close to bedtime.   Do not eat a large meal or eat spicy foods right before bedtime. This can lead to digestive discomfort that can make it hard for you to sleep.  Keep a sleep diary to help you and your health care provider figure out what could be causing your insomnia.  . Make your bedroom a dark, comfortable place where it is easy to fall asleep. ? Put up shades  or blackout curtains to block light from outside. ? Use a white noise machine to block noise. ? Keep the temperature cool. . Limit screen use before bedtime. This includes: ? Watching TV. ? Using your smartphone, tablet, or computer. . Stick to a routine that includes going to bed and waking up at the same times every day and night. This can help you fall asleep faster. Consider making a quiet activity, such as reading, part of your nighttime routine. . Try to avoid taking naps during the day so that you sleep better at  night. . Get out of bed if you are still awake after 15 minutes of trying to sleep. Keep the lights down, but try reading or doing a quiet activity. When you feel sleepy, go back to bed.  3. Type 2 diabetes mellitus with other specified complication, without long-term current use of insulin (HCC) Good blood sugar control is important to decrease the likelihood of diabetic complications such as nephropathy, neuropathy, limb loss, blindness, coronary artery disease, and death. Intensive lifestyle modification including diet, exercise and weight loss are the first line of treatment for diabetes.   4. At risk for heart disease Briana Ferguson was given approximately 15 minutes of coronary artery disease prevention counseling today. She is 59 y.o. female and has risk factors for heart disease including obesity. We discussed intensive lifestyle modifications today with an emphasis on specific weight loss instructions and strategies.   Repetitive spaced learning was employed today to elicit superior memory formation and behavioral change.  5. Class 2 severe obesity with serious comorbidity and body mass index (BMI) of 37.0 to 37.9 in adult, unspecified obesity type (HCC) Briana Ferguson is currently in the action stage of change. As such, her goal is to continue with weight loss efforts. She has agreed to the Category 2 Plan.   Exercise goals: Core strength.  Behavioral modification strategies: increasing lean protein intake.  Briana Ferguson has agreed to follow-up with our clinic in 2 weeks. She was informed of the importance of frequent follow-up visits to maximize her success with intensive lifestyle modifications for her multiple health conditions.   Objective:   Blood pressure 125/74, pulse 74, temperature 98.1 F (36.7 C), temperature source Oral, height 5\' 8"  (1.727 m), weight 248 lb (112.5 kg), last menstrual period 04/03/2011, SpO2 99 %. Body mass index is 37.71 kg/m.  General: Cooperative, alert, well developed, in  no acute distress. HEENT: Conjunctivae and lids unremarkable. Cardiovascular: Regular rhythm.  Lungs: Normal work of breathing. Neurologic: No focal deficits.   Lab Results  Component Value Date   CREATININE 0.94 05/07/2019   BUN 12 05/07/2019   NA 138 05/07/2019   K 4.3 05/07/2019   CL 99 05/07/2019   CO2 24 05/07/2019   Lab Results  Component Value Date   ALT 14 05/07/2019   AST 16 05/07/2019   ALKPHOS 95 05/07/2019   BILITOT 0.4 05/07/2019   Lab Results  Component Value Date   HGBA1C 6.2 (H) 05/07/2019   HGBA1C 6.5 03/19/2019   Lab Results  Component Value Date   INSULIN 18.9 05/07/2019   Lab Results  Component Value Date   TSH 2.110 05/07/2019   Lab Results  Component Value Date   CHOL 159 05/07/2019   HDL 63 05/07/2019   LDLCALC 81 05/07/2019   TRIG 80 05/07/2019   CHOLHDL 3 03/19/2019   Lab Results  Component Value Date   WBC 5.2 05/07/2019   HGB 13.3 05/07/2019   HCT 41.0 05/07/2019  MCV 87 05/07/2019   PLT 325 05/07/2019   Attestation Statements:   Reviewed by clinician on day of visit: allergies, medications, problem list, medical history, surgical history, family history, social history, and previous encounter notes.  I, Water quality scientist, CMA, am acting as Location manager for [employer], DO.  I have reviewed the above documentation for accuracy and completeness, and I agree with the above. Briscoe Deutscher, DO

## 2019-07-17 ENCOUNTER — Other Ambulatory Visit: Payer: Self-pay

## 2019-07-17 ENCOUNTER — Encounter (INDEPENDENT_AMBULATORY_CARE_PROVIDER_SITE_OTHER): Payer: Self-pay | Admitting: Family Medicine

## 2019-07-17 ENCOUNTER — Ambulatory Visit (INDEPENDENT_AMBULATORY_CARE_PROVIDER_SITE_OTHER): Payer: BC Managed Care – PPO | Admitting: Family Medicine

## 2019-07-17 VITALS — BP 123/78 | HR 70 | Temp 98.9°F | Ht 68.0 in | Wt 245.0 lb

## 2019-07-17 DIAGNOSIS — Z6837 Body mass index (BMI) 37.0-37.9, adult: Secondary | ICD-10-CM

## 2019-07-17 DIAGNOSIS — E559 Vitamin D deficiency, unspecified: Secondary | ICD-10-CM

## 2019-07-17 DIAGNOSIS — E1169 Type 2 diabetes mellitus with other specified complication: Secondary | ICD-10-CM

## 2019-07-17 DIAGNOSIS — Z9189 Other specified personal risk factors, not elsewhere classified: Secondary | ICD-10-CM | POA: Diagnosis not present

## 2019-07-17 DIAGNOSIS — Z9989 Dependence on other enabling machines and devices: Secondary | ICD-10-CM

## 2019-07-17 DIAGNOSIS — G4733 Obstructive sleep apnea (adult) (pediatric): Secondary | ICD-10-CM | POA: Diagnosis not present

## 2019-07-17 MED ORDER — METFORMIN HCL 500 MG PO TABS: 500.0000 mg | ORAL_TABLET | Freq: Every day | ORAL | 0 refills | Status: DC

## 2019-07-17 MED ORDER — VITAMIN D (ERGOCALCIFEROL) 1.25 MG (50000 UNIT) PO CAPS: 50000.0000 [IU] | ORAL_CAPSULE | ORAL | 0 refills | Status: DC

## 2019-07-17 NOTE — Progress Notes (Signed)
Chief Complaint:   OBESITY Briana Ferguson is here to discuss her progress with her obesity treatment plan along with follow-up of her obesity related diagnoses. Briana Ferguson is on the Category 2 Plan and states she is following her eating plan approximately 70% of the time. Briana Ferguson states she is exercising for 0 minutes 0 times per week.  Today's visit was #: 6 Starting weight: 261 lbs Starting date: 05/07/2019 Today's weight: 245 lbs Today's date: 07/17/2019 Total lbs lost to date: 16 lbs Total lbs lost since last in-office visit: 3 lbs  Interim History: Briana Ferguson says she is having no issues with hunger.  She is following the plan and made good decisions when she was in [place].  She is not using olive oil.  Subjective:   1. Type 2 diabetes mellitus with other specified complication, without long-term current use of insulin (HCC) Medications reviewed. She is taking metformin 500 mg daily.  Lab Results  Component Value Date   HGBA1C 6.2 (H) 05/07/2019   HGBA1C 6.5 03/19/2019   Lab Results  Component Value Date   LDLCALC 81 05/07/2019   CREATININE 0.94 05/07/2019   Lab Results  Component Value Date   INSULIN 18.9 05/07/2019   2. Vitamin D deficiency Briana Ferguson's Vitamin D level was 37.2 on 05/07/2019. She is currently taking vit D. She denies nausea, vomiting or muscle weakness.  3. OSA on CPAP Briana Ferguson has a diagnosis of sleep apnea. She reports that she is using a CPAP regularly.   Assessment/Plan:   1. Type 2 diabetes mellitus with other specified complication, without long-term current use of insulin (HCC) Good blood sugar control is important to decrease the likelihood of diabetic complications such as nephropathy, neuropathy, limb loss, blindness, coronary artery disease, and death. Intensive lifestyle modification including diet, exercise and weight loss are the first line of treatment for diabetes.   2. Vitamin D deficiency Low Vitamin D level contributes to fatigue and are associated  with obesity, breast, and colon cancer. She agrees to continue to take prescription Vitamin D @50 ,000 IU every week and will follow-up for routine testing of Vitamin D, at least 2-3 times per year to avoid over-replacement.  Orders - Vitamin D, Ergocalciferol, (DRISDOL) 1.25 MG (50000 UNIT) CAPS capsule; Take 1 capsule (50,000 Units total) by mouth every 7 (seven) days.  Dispense: 4 capsule; Refill: 0  3. OSA on CPAP Intensive lifestyle modifications are the first line treatment for this issue. We discussed several lifestyle modifications today and she will continue to work on diet, exercise and weight loss efforts. We will continue to monitor. Orders and follow up as documented in patient record.   Counseling  Sleep apnea is a condition in which breathing pauses or becomes shallow during sleep. This happens over and over during the night. This disrupts your sleep and keeps your body from getting the rest that it needs, which can cause tiredness and lack of energy (fatigue) during the day.  Sleep apnea treatment: If you were given a device to open your airway while you sleep, USE IT!  Sleep hygiene:   Limit or avoid alcohol, caffeinated beverages, and cigarettes, especially close to bedtime.   Do not eat a large meal or eat spicy foods right before bedtime. This can lead to digestive discomfort that can make it hard for you to sleep.  Keep a sleep diary to help you and your health care provider figure out what could be causing your insomnia.  . Make your bedroom a dark,  comfortable place where it is easy to fall asleep. ? Put up shades or blackout curtains to block light from outside. ? Use a white noise machine to block noise. ? Keep the temperature cool. . Limit screen use before bedtime. This includes: ? Watching TV. ? Using your smartphone, tablet, or computer. . Stick to a routine that includes going to bed and waking up at the same times every day and night. This can help you fall  asleep faster. Consider making a quiet activity, such as reading, part of your nighttime routine. . Try to avoid taking naps during the day so that you sleep better at night. . Get out of bed if you are still awake after 15 minutes of trying to sleep. Keep the lights down, but try reading or doing a quiet activity. When you feel sleepy, go back to bed.  4. At risk for heart disease Briana Ferguson was given approximately 15 minutes of coronary artery disease prevention counseling today. She is 59 y.o. female and has risk factors for heart disease including obesity. We discussed intensive lifestyle modifications today with an emphasis on specific weight loss instructions and strategies.   Repetitive spaced learning was employed today to elicit superior memory formation and behavioral change.  5. Class 2 severe obesity with serious comorbidity and body mass index (BMI) of 37.0 to 37.9 in adult, unspecified obesity type (HCC) Briana Ferguson is currently in the action stage of change. As such, her goal is to continue with weight loss efforts. She has agreed to the Category 2 Plan.   Exercise goals: Walk 3 times a week for 20 minutes.  Behavioral modification strategies: increasing lean protein intake and increasing water intake.  Briana Ferguson has agreed to follow-up with our clinic in 2 weeks. She was informed of the importance of frequent follow-up visits to maximize her success with intensive lifestyle modifications for her multiple health conditions.   Objective:   Blood pressure 123/78, pulse 70, temperature 98.9 F (37.2 C), temperature source Oral, height 5\' 8"  (1.727 m), weight 245 lb (111.1 kg), last menstrual period 04/03/2011, SpO2 99 %. Body mass index is 37.25 kg/m.  General: Cooperative, alert, well developed, in no acute distress. HEENT: Conjunctivae and lids unremarkable. Cardiovascular: Regular rhythm.  Lungs: Normal work of breathing. Neurologic: No focal deficits.   Lab Results  Component Value  Date   CREATININE 0.94 05/07/2019   BUN 12 05/07/2019   NA 138 05/07/2019   K 4.3 05/07/2019   CL 99 05/07/2019   CO2 24 05/07/2019   Lab Results  Component Value Date   ALT 14 05/07/2019   AST 16 05/07/2019   ALKPHOS 95 05/07/2019   BILITOT 0.4 05/07/2019   Lab Results  Component Value Date   HGBA1C 6.2 (H) 05/07/2019   HGBA1C 6.5 03/19/2019   Lab Results  Component Value Date   INSULIN 18.9 05/07/2019   Lab Results  Component Value Date   TSH 2.110 05/07/2019   Lab Results  Component Value Date   CHOL 159 05/07/2019   HDL 63 05/07/2019   LDLCALC 81 05/07/2019   TRIG 80 05/07/2019   CHOLHDL 3 03/19/2019   Lab Results  Component Value Date   WBC 5.2 05/07/2019   HGB 13.3 05/07/2019   HCT 41.0 05/07/2019   MCV 87 05/07/2019   PLT 325 05/07/2019   Attestation Statements:   Reviewed by clinician on day of visit: allergies, medications, problem list, medical history, surgical history, family history, social history, and previous encounter  notes.  I, Water quality scientist, CMA, am acting as Location manager for [employer], DO.  I have reviewed the above documentation for accuracy and completeness, and I agree with the above. Briscoe Deutscher, DO

## 2019-07-24 ENCOUNTER — Encounter (INDEPENDENT_AMBULATORY_CARE_PROVIDER_SITE_OTHER): Payer: Self-pay | Admitting: Family Medicine

## 2019-07-24 NOTE — Telephone Encounter (Signed)
Please advise thanks.

## 2019-07-25 ENCOUNTER — Encounter (INDEPENDENT_AMBULATORY_CARE_PROVIDER_SITE_OTHER): Payer: Self-pay | Admitting: *Deleted

## 2019-07-31 ENCOUNTER — Encounter (INDEPENDENT_AMBULATORY_CARE_PROVIDER_SITE_OTHER): Payer: Self-pay | Admitting: Family Medicine

## 2019-07-31 ENCOUNTER — Ambulatory Visit (INDEPENDENT_AMBULATORY_CARE_PROVIDER_SITE_OTHER): Payer: BC Managed Care – PPO | Admitting: Family Medicine

## 2019-07-31 ENCOUNTER — Other Ambulatory Visit: Payer: Self-pay

## 2019-07-31 VITALS — BP 111/71 | HR 71 | Temp 98.9°F | Ht 68.0 in | Wt 243.0 lb

## 2019-07-31 DIAGNOSIS — E1169 Type 2 diabetes mellitus with other specified complication: Secondary | ICD-10-CM | POA: Diagnosis not present

## 2019-07-31 DIAGNOSIS — Z6836 Body mass index (BMI) 36.0-36.9, adult: Secondary | ICD-10-CM

## 2019-07-31 DIAGNOSIS — G4733 Obstructive sleep apnea (adult) (pediatric): Secondary | ICD-10-CM

## 2019-07-31 DIAGNOSIS — E559 Vitamin D deficiency, unspecified: Secondary | ICD-10-CM

## 2019-07-31 DIAGNOSIS — Z9989 Dependence on other enabling machines and devices: Secondary | ICD-10-CM

## 2019-07-31 NOTE — Progress Notes (Signed)
Chief Complaint:   OBESITY Briana Ferguson is here to discuss her progress with her obesity treatment plan along with follow-up of her obesity related diagnoses. [name] is on the Category 2 Plan and states she is following her eating plan approximately 80% of the time. [name] states she is walking/dancing for 30 minutes 2 times per week and lifting weights for  30 minutes 1 time per week.  Today's visit was #: 7 Starting weight: 261 lbs Starting date: 05/07/2019 Today's weight: 243 lbs Today's date: 07/31/2019 Total lbs lost to date: 18 lbs Total lbs lost since last in-office visit: 2 lbs  Interim History: [name] reports being under increased stress due to her husband's dental surgery and death in her family.   Subjective:   1. Type 2 diabetes mellitus with other specified complication, without long-term current use of insulin (HCC) Medications reviewed.  She is taking metformin 500 mg daily.  Lab Results  Component Value Date   HGBA1C 6.2 (H) 05/07/2019   HGBA1C 6.5 03/19/2019   Lab Results  Component Value Date   LDLCALC 81 05/07/2019   CREATININE 0.94 05/07/2019   Lab Results  Component Value Date   INSULIN 18.9 05/07/2019   2. Vitamin D deficiency Kada's Vitamin D level was 37.2 on 05/07/2019. She is currently taking prescription vitamin D 50,000 IU each week. She denies nausea, vomiting or muscle weakness.  3. OSA on CPAP [name] has a diagnosis of sleep apnea. She reports that she is using a CPAP regularly.   Assessment/Plan:   1. Type 2 diabetes mellitus with other specified complication, without long-term current use of insulin (HCC) Good blood sugar control is important to decrease the likelihood of diabetic complications such as nephropathy, neuropathy, limb loss, blindness, coronary artery disease, and death. Intensive lifestyle modification including diet, exercise and weight loss are the first line of treatment for diabetes.   2. Vitamin D deficiency Low Vitamin D  level contributes to fatigue and are associated with obesity, breast, and colon cancer. She agrees to continue to take prescription Vitamin D @50 ,000 IU every week and will follow-up for routine testing of Vitamin D, at least 2-3 times per year to avoid over-replacement.  3. OSA on CPAP Intensive lifestyle modifications are the first line treatment for this issue. We discussed several lifestyle modifications today and she will continue to work on diet, exercise and weight loss efforts. We will continue to monitor. Orders and follow up as documented in patient record.   Counseling  Sleep apnea is a condition in which breathing pauses or becomes shallow during sleep. This happens over and over during the night. This disrupts your sleep and keeps your body from getting the rest that it needs, which can cause tiredness and lack of energy (fatigue) during the day.  Sleep apnea treatment: If you were given a device to open your airway while you sleep, USE IT!  Sleep hygiene:   Limit or avoid alcohol, caffeinated beverages, and cigarettes, especially close to bedtime.   Do not eat a large meal or eat spicy foods right before bedtime. This can lead to digestive discomfort that can make it hard for you to sleep.  Keep a sleep diary to help you and your health care provider figure out what could be causing your insomnia.  . Make your bedroom a dark, comfortable place where it is easy to fall asleep. ? Put up shades or blackout curtains to block light from outside. ? Use a white noise machine  to block noise. ? Keep the temperature cool. . Limit screen use before bedtime. This includes: ? Watching TV. ? Using your smartphone, tablet, or computer. . Stick to a routine that includes going to bed and waking up at the same times every day and night. This can help you fall asleep faster. Consider making a quiet activity, such as reading, part of your nighttime routine. . Try to avoid taking naps during the  day so that you sleep better at night. . Get out of bed if you are still awake after 15 minutes of trying to sleep. Keep the lights down, but try reading or doing a quiet activity. When you feel sleepy, go back to bed.  4. Class 2 severe obesity with serious comorbidity and body mass index (BMI) of 36.0 to 36.9 in adult, unspecified obesity type (HCC) [name] is currently in the action stage of change. As such, her goal is to continue with weight loss efforts. She has agreed to the Category 2 Plan.   Exercise goals: As is.  Behavioral modification strategies: increasing water intake.  [name] has agreed to follow-up with our clinic in 2 weeks. She was informed of the importance of frequent follow-up visits to maximize her success with intensive lifestyle modifications for her multiple health conditions.   Objective:   Blood pressure 111/71, pulse 71, temperature 98.9 F (37.2 C), temperature source Oral, height 5\' 8"  (1.727 m), weight 243 lb (110.2 kg), last menstrual period 04/03/2011, SpO2 98 %. Body mass index is 36.95 kg/m.  General: Cooperative, alert, well developed, in no acute distress. HEENT: Conjunctivae and lids unremarkable. Cardiovascular: Regular rhythm.  Lungs: Normal work of breathing. Neurologic: No focal deficits.   Lab Results  Component Value Date   CREATININE 0.94 05/07/2019   BUN 12 05/07/2019   NA 138 05/07/2019   K 4.3 05/07/2019   CL 99 05/07/2019   CO2 24 05/07/2019   Lab Results  Component Value Date   ALT 14 05/07/2019   AST 16 05/07/2019   ALKPHOS 95 05/07/2019   BILITOT 0.4 05/07/2019   Lab Results  Component Value Date   HGBA1C 6.2 (H) 05/07/2019   HGBA1C 6.5 03/19/2019   Lab Results  Component Value Date   INSULIN 18.9 05/07/2019   Lab Results  Component Value Date   TSH 2.110 05/07/2019   Lab Results  Component Value Date   CHOL 159 05/07/2019   HDL 63 05/07/2019   LDLCALC 81 05/07/2019   TRIG 80 05/07/2019   CHOLHDL 3  03/19/2019   Lab Results  Component Value Date   WBC 5.2 05/07/2019   HGB 13.3 05/07/2019   HCT 41.0 05/07/2019   MCV 87 05/07/2019   PLT 325 05/07/2019   Attestation Statements:   Reviewed by clinician on day of visit: allergies, medications, problem list, medical history, surgical history, family history, social history, and previous encounter notes.  Time spent on visit including pre-visit chart review and post-visit care and documentation was 25 minutes. Time was spent on: preparing to see the patient (eg, review of tests), obtaining and/or reviewing separately obtained history, performing a medically necessary appropriate examination and/or evaluation, counseling and educating the patient/family/caregiver and documenting clinical information in the electronic or other health.   I, Water quality scientist, CMA, am acting as Location manager for [employer], DO.  I have reviewed the above documentation for accuracy and completeness, and I agree with the above. Briscoe Deutscher, DO

## 2019-08-05 ENCOUNTER — Other Ambulatory Visit (INDEPENDENT_AMBULATORY_CARE_PROVIDER_SITE_OTHER): Payer: Self-pay | Admitting: Family Medicine

## 2019-08-05 ENCOUNTER — Telehealth (INDEPENDENT_AMBULATORY_CARE_PROVIDER_SITE_OTHER): Payer: Self-pay | Admitting: Family Medicine

## 2019-08-05 DIAGNOSIS — E1169 Type 2 diabetes mellitus with other specified complication: Secondary | ICD-10-CM

## 2019-08-06 ENCOUNTER — Other Ambulatory Visit (INDEPENDENT_AMBULATORY_CARE_PROVIDER_SITE_OTHER): Payer: Self-pay | Admitting: *Deleted

## 2019-08-06 DIAGNOSIS — E559 Vitamin D deficiency, unspecified: Secondary | ICD-10-CM

## 2019-08-06 MED ORDER — VITAMIN D (ERGOCALCIFEROL) 1.25 MG (50000 UNIT) PO CAPS: 50000.0000 [IU] | ORAL_CAPSULE | ORAL | 0 refills | Status: DC

## 2019-08-07 ENCOUNTER — Other Ambulatory Visit (INDEPENDENT_AMBULATORY_CARE_PROVIDER_SITE_OTHER): Payer: Self-pay | Admitting: Family Medicine

## 2019-08-07 DIAGNOSIS — E559 Vitamin D deficiency, unspecified: Secondary | ICD-10-CM

## 2019-08-07 DIAGNOSIS — E1169 Type 2 diabetes mellitus with other specified complication: Secondary | ICD-10-CM

## 2019-08-27 ENCOUNTER — Other Ambulatory Visit: Payer: Self-pay

## 2019-08-27 ENCOUNTER — Encounter (INDEPENDENT_AMBULATORY_CARE_PROVIDER_SITE_OTHER): Payer: Self-pay | Admitting: Family Medicine

## 2019-08-27 ENCOUNTER — Ambulatory Visit (INDEPENDENT_AMBULATORY_CARE_PROVIDER_SITE_OTHER): Payer: BC Managed Care – PPO | Admitting: Family Medicine

## 2019-08-27 VITALS — BP 117/68 | HR 57 | Temp 98.8°F | Ht 68.0 in | Wt 242.0 lb

## 2019-08-27 DIAGNOSIS — E1169 Type 2 diabetes mellitus with other specified complication: Secondary | ICD-10-CM | POA: Diagnosis not present

## 2019-08-27 DIAGNOSIS — Z6836 Body mass index (BMI) 36.0-36.9, adult: Secondary | ICD-10-CM

## 2019-08-27 DIAGNOSIS — Z9189 Other specified personal risk factors, not elsewhere classified: Secondary | ICD-10-CM | POA: Diagnosis not present

## 2019-08-27 DIAGNOSIS — L700 Acne vulgaris: Secondary | ICD-10-CM | POA: Diagnosis not present

## 2019-08-27 DIAGNOSIS — E559 Vitamin D deficiency, unspecified: Secondary | ICD-10-CM | POA: Diagnosis not present

## 2019-08-27 NOTE — Progress Notes (Signed)
Chief Complaint:   OBESITY Briana Ferguson is here to discuss her progress with her obesity treatment plan along with follow-up of her obesity related diagnoses. Briana Ferguson is on the Category 2 Plan and states she is following her eating plan approximately 80% of the time. Briana Ferguson states she is walking/biking for 30 minutes 2 times per week.  Today's visit was #: 8 Starting weight: 261 lbs Starting date: 05/07/2019 Today's weight: 242 lbs Today's date: 08/27/2019 Total lbs lost to date: 19 lbs Total lbs lost since last in-office visit: 1 lb  Interim History: Briana Ferguson went on vacation and enjoyed some celebration eating.  She says she exercised while on vacation.  Subjective:   1. Vitamin D deficiency Briana Ferguson's Vitamin D level was 37.2 on 05/07/2019. She is currently taking prescription vitamin D 50,000 IU each week. She denies nausea, vomiting or muscle weakness.  2. Type 2 diabetes mellitus with other specified complication, without long-term current use of insulin (HCC) Medications reviewed. Diabetic ROS: no polyuria or polydipsia, no chest pain, dyspnea or TIA's, no numbness, tingling or pain in extremities.   Lab Results  Component Value Date   HGBA1C 6.2 (H) 05/07/2019   HGBA1C 6.5 03/19/2019   Lab Results  Component Value Date   LDLCALC 81 05/07/2019   CREATININE 0.94 05/07/2019   Lab Results  Component Value Date   INSULIN 18.9 05/07/2019   3. Cystic acne Briana Ferguson has an appointment today with Dermatology.  Assessment/Plan:   1. Vitamin D deficiency Low Vitamin D level contributes to fatigue and are associated with obesity, breast, and colon cancer. She agrees to continue to take prescription Vitamin D @50 ,000 IU every week and will follow-up for routine testing of Vitamin D, at least 2-3 times per year to avoid over-replacement.  Orders - Vitamin D, Ergocalciferol, (DRISDOL) 1.25 MG (50000 UNIT) CAPS capsule; Take 1 capsule (50,000 Units total) by mouth every 7 (seven) days.   Dispense: 4 capsule; Refill: 0 - VITAMIN D 25 Hydroxy (Vit-D Deficiency, Fractures)  2. Type 2 diabetes mellitus with other specified complication, without long-term current use of insulin (HCC) Good blood sugar control is important to decrease the likelihood of diabetic complications such as nephropathy, neuropathy, limb loss, blindness, coronary artery disease, and death. Intensive lifestyle modification including diet, exercise and weight loss are the first line of treatment for diabetes.   Orders - metFORMIN (GLUCOPHAGE) 500 MG tablet; Take 1 tablet (500 mg total) by mouth daily with breakfast.  Dispense: 90 tablet; Refill: 0 - Hemoglobin A1c  3. Cystic acne Will continue to monitor.  4. At risk for heart disease Briana Ferguson was given approximately 15 minutes of coronary artery disease prevention counseling today. She is 59 y.o. female and has risk factors for heart disease including obesity. We discussed intensive lifestyle modifications today with an emphasis on specific weight loss instructions and strategies.   Repetitive spaced learning was employed today to elicit superior memory formation and behavioral change.  5. Class 2 severe obesity with serious comorbidity and body mass index (BMI) of 36.0 to 36.9 in adult, unspecified obesity type (HCC) Briana Ferguson is currently in the action stage of change. As such, her goal is to continue with weight loss efforts. She has agreed to the Category 2 Plan.   Exercise goals: As is.  Behavioral modification strategies: increasing lean protein intake and increasing water intake.  Briana Ferguson has agreed to follow-up with our clinic in 2 weeks. She was informed of the importance of frequent follow-up visits to  maximize her success with intensive lifestyle modifications for her multiple health conditions.   Briana Ferguson was informed we would discuss her lab results at her next visit unless there is a critical issue that needs to be addressed sooner. Briana Ferguson agreed to keep  her next visit at the agreed upon time to discuss these results.  Objective:   Blood pressure 117/68, pulse (!) 57, temperature 98.8 F (37.1 C), temperature source Oral, height 5\' 8"  (1.727 m), weight 242 lb (109.8 kg), last menstrual period 04/03/2011, SpO2 99 %. Body mass index is 36.8 kg/m.  General: Cooperative, alert, well developed, in no acute distress. HEENT: Conjunctivae and lids unremarkable. Cardiovascular: Regular rhythm.  Lungs: Normal work of breathing. Neurologic: No focal deficits.   Lab Results  Component Value Date   CREATININE 0.94 05/07/2019   BUN 12 05/07/2019   NA 138 05/07/2019   K 4.3 05/07/2019   CL 99 05/07/2019   CO2 24 05/07/2019   Lab Results  Component Value Date   ALT 14 05/07/2019   AST 16 05/07/2019   ALKPHOS 95 05/07/2019   BILITOT 0.4 05/07/2019   Lab Results  Component Value Date   HGBA1C 6.2 (H) 05/07/2019   HGBA1C 6.5 03/19/2019   Lab Results  Component Value Date   INSULIN 18.9 05/07/2019   Lab Results  Component Value Date   TSH 2.110 05/07/2019   Lab Results  Component Value Date   CHOL 159 05/07/2019   HDL 63 05/07/2019   LDLCALC 81 05/07/2019   TRIG 80 05/07/2019   CHOLHDL 3 03/19/2019   Lab Results  Component Value Date   WBC 5.2 05/07/2019   HGB 13.3 05/07/2019   HCT 41.0 05/07/2019   MCV 87 05/07/2019   PLT 325 05/07/2019   Attestation Statements:   Reviewed by clinician on day of visit: allergies, medications, problem list, medical history, surgical history, family history, social history, and previous encounter notes.  I, Water quality scientist, CMA, am acting as Location manager for [employer], DO.  I have reviewed the above documentation for accuracy and completeness, and I agree with the above. Briscoe Deutscher, DO

## 2019-08-28 ENCOUNTER — Encounter (INDEPENDENT_AMBULATORY_CARE_PROVIDER_SITE_OTHER): Payer: Self-pay | Admitting: Family Medicine

## 2019-08-28 LAB — HEMOGLOBIN A1C
Est. average glucose Bld gHb Est-mCnc: 123 mg/dL
Hgb A1c MFr Bld: 5.9 % — ABNORMAL HIGH (ref 4.8–5.6)

## 2019-08-28 LAB — VITAMIN D 25 HYDROXY (VIT D DEFICIENCY, FRACTURES): Vit D, 25-Hydroxy: 69 ng/mL (ref 30.0–100.0)

## 2019-08-29 MED ORDER — METFORMIN HCL 500 MG PO TABS: 500.0000 mg | ORAL_TABLET | Freq: Every day | ORAL | 0 refills | Status: DC

## 2019-08-29 MED ORDER — VITAMIN D (ERGOCALCIFEROL) 1.25 MG (50000 UNIT) PO CAPS: 50000.0000 [IU] | ORAL_CAPSULE | ORAL | 0 refills | Status: DC

## 2019-08-29 NOTE — Telephone Encounter (Signed)
Please advise. Thanks.  

## 2019-09-12 ENCOUNTER — Other Ambulatory Visit (INDEPENDENT_AMBULATORY_CARE_PROVIDER_SITE_OTHER): Payer: Self-pay | Admitting: Family Medicine

## 2019-09-12 DIAGNOSIS — E1169 Type 2 diabetes mellitus with other specified complication: Secondary | ICD-10-CM

## 2019-09-18 ENCOUNTER — Encounter (INDEPENDENT_AMBULATORY_CARE_PROVIDER_SITE_OTHER): Payer: Self-pay | Admitting: Family Medicine

## 2019-09-18 ENCOUNTER — Ambulatory Visit (INDEPENDENT_AMBULATORY_CARE_PROVIDER_SITE_OTHER): Payer: BC Managed Care – PPO | Admitting: Family Medicine

## 2019-09-18 ENCOUNTER — Other Ambulatory Visit: Payer: Self-pay

## 2019-09-18 VITALS — BP 121/67 | HR 55 | Temp 98.4°F | Ht 68.0 in | Wt 239.0 lb

## 2019-09-18 DIAGNOSIS — R7303 Prediabetes: Secondary | ICD-10-CM

## 2019-09-18 DIAGNOSIS — E559 Vitamin D deficiency, unspecified: Secondary | ICD-10-CM | POA: Diagnosis not present

## 2019-09-18 DIAGNOSIS — Z6836 Body mass index (BMI) 36.0-36.9, adult: Secondary | ICD-10-CM | POA: Diagnosis not present

## 2019-09-18 NOTE — Progress Notes (Signed)
Chief Complaint:   OBESITY Briana Ferguson is here to discuss her progress with her obesity treatment plan along with follow-up of her obesity related diagnoses. Guy is on the Category 2 Plan and states she is following her eating plan approximately 80% of the time. [name] states she is walking/steps for 30 minutes 1-2 times per week.  Today's visit was #: 9 Starting weight: 261 lbs Starting date: 05/07/2019 Today's weight: 239 lbs Today's date: 09/18/2019 Total lbs lost to date: 22 lbs Total lbs lost since last in-office visit: 3 lbs  Interim History: Tone says that she is leaving next Thursday for [place].  She reports eating crab legs last night.  She is up 3.5 pounds of water weight.  Subjective:   1. Vitamin D deficiency Briana Ferguson's Vitamin D level was 69.0 on 08/27/2019. She is currently taking OTC vitamin D 2000 IU each day. She denies nausea, vomiting or muscle weakness.  Vitamin D level is at goal.  2. Prediabetes Briana Ferguson has a diagnosis of prediabetes based on her elevated HgA1c and was informed this puts her at greater risk of developing diabetes. She continues to work on diet and exercise to decrease her risk of diabetes. She denies nausea or hypoglycemia.  A1c is improving (6.2-5.9).  She is taking metformin.  Lab Results  Component Value Date   HGBA1C 5.9 (H) 08/27/2019   Lab Results  Component Value Date   INSULIN 18.9 05/07/2019   Assessment/Plan:   1. Vitamin D deficiency Low Vitamin D level contributes to fatigue and are associated with obesity, breast, and colon cancer. She agrees to continue to take OTC Vitamin D @2 ,000 IU daily and will follow-up for routine testing of Vitamin D, at least 2-3 times per year to avoid over-replacement.  2. Prediabetes Briana Ferguson will continue to work on weight loss, exercise, and decreasing simple carbohydrates to help decrease the risk of diabetes.   3. Class 2 severe obesity with serious comorbidity and body mass index (BMI) of 36.0 to 36.9  in adult, unspecified obesity type (HCC) Briana Ferguson is currently in the action stage of change. As such, her goal is to continue with weight loss efforts. She has agreed to the Category 2 Plan.   Exercise goals: For substantial health benefits, adults should do at least 150 minutes (2 hours and 30 minutes) a week of moderate-intensity, or 75 minutes (1 hour and 15 minutes) a week of vigorous-intensity aerobic physical activity, or an equivalent combination of moderate- and vigorous-intensity aerobic activity. Aerobic activity should be performed in episodes of at least 10 minutes, and preferably, it should be spread throughout the week.  Behavioral modification strategies: increasing lean protein intake and increasing water intake.  Briana Ferguson has agreed to follow-up with our clinic in 2 weeks. She was informed of the importance of frequent follow-up visits to maximize her success with intensive lifestyle modifications for her multiple health conditions.   Objective:   Blood pressure 121/67, pulse (!) 55, temperature 98.4 F (36.9 C), temperature source Oral, height 5\' 8"  (1.727 m), weight 239 lb (108.4 kg), last menstrual period 04/03/2011, SpO2 97 %. Body mass index is 36.34 kg/m.  General: Cooperative, alert, well developed, in no acute distress. HEENT: Conjunctivae and lids unremarkable. Cardiovascular: Regular rhythm.  Lungs: Normal work of breathing. Neurologic: No focal deficits.   Lab Results  Component Value Date   CREATININE 0.94 05/07/2019   BUN 12 05/07/2019   NA 138 05/07/2019   K 4.3 05/07/2019   CL 99 05/07/2019   CO2  24 05/07/2019   Lab Results  Component Value Date   ALT 14 05/07/2019   AST 16 05/07/2019   ALKPHOS 95 05/07/2019   BILITOT 0.4 05/07/2019   Lab Results  Component Value Date   HGBA1C 5.9 (H) 08/27/2019   HGBA1C 6.2 (H) 05/07/2019   HGBA1C 6.5 03/19/2019   Lab Results  Component Value Date   INSULIN 18.9 05/07/2019   Lab Results  Component Value  Date   TSH 2.110 05/07/2019   Lab Results  Component Value Date   CHOL 159 05/07/2019   HDL 63 05/07/2019   LDLCALC 81 05/07/2019   TRIG 80 05/07/2019   CHOLHDL 3 03/19/2019   Lab Results  Component Value Date   WBC 5.2 05/07/2019   HGB 13.3 05/07/2019   HCT 41.0 05/07/2019   MCV 87 05/07/2019   PLT 325 05/07/2019   Attestation Statements:   Reviewed by clinician on day of visit: allergies, medications, problem list, medical history, surgical history, family history, social history, and previous encounter notes.  Time spent on visit including pre-visit chart review and post-visit care and charting was 20 minutes.   I, Water quality scientist, CMA, am acting as Location manager for [employer], DO.  I have reviewed the above documentation for accuracy and completeness, and I agree with the above. Briscoe Deutscher, DO

## 2019-10-02 ENCOUNTER — Encounter (INDEPENDENT_AMBULATORY_CARE_PROVIDER_SITE_OTHER): Payer: Self-pay | Admitting: Family Medicine

## 2019-10-02 NOTE — Telephone Encounter (Signed)
Please advise. Thanks.  

## 2019-10-03 ENCOUNTER — Other Ambulatory Visit (INDEPENDENT_AMBULATORY_CARE_PROVIDER_SITE_OTHER): Payer: Self-pay | Admitting: Family Medicine

## 2019-10-03 DIAGNOSIS — E1169 Type 2 diabetes mellitus with other specified complication: Secondary | ICD-10-CM

## 2019-10-14 ENCOUNTER — Other Ambulatory Visit: Payer: Self-pay

## 2019-10-14 ENCOUNTER — Encounter (INDEPENDENT_AMBULATORY_CARE_PROVIDER_SITE_OTHER): Payer: Self-pay | Admitting: Family Medicine

## 2019-10-14 ENCOUNTER — Ambulatory Visit (INDEPENDENT_AMBULATORY_CARE_PROVIDER_SITE_OTHER): Payer: BC Managed Care – PPO | Admitting: Family Medicine

## 2019-10-14 VITALS — BP 122/74 | HR 78 | Temp 98.2°F | Ht 68.0 in | Wt 233.0 lb

## 2019-10-14 DIAGNOSIS — Z6835 Body mass index (BMI) 35.0-35.9, adult: Secondary | ICD-10-CM

## 2019-10-14 DIAGNOSIS — R7303 Prediabetes: Secondary | ICD-10-CM | POA: Diagnosis not present

## 2019-10-14 DIAGNOSIS — E559 Vitamin D deficiency, unspecified: Secondary | ICD-10-CM | POA: Diagnosis not present

## 2019-10-14 DIAGNOSIS — E66812 Obesity, class 2: Secondary | ICD-10-CM

## 2019-10-15 NOTE — Progress Notes (Signed)
Chief Complaint:   OBESITY Briana Ferguson is here to discuss her progress with her obesity treatment plan along with follow-up of her obesity related diagnoses. Briana Ferguson is on the Category 2 Plan and states she is following her eating plan approximately 60% of the time. Briana Ferguson states she is walking and increasing her overall activity level.  Today's visit was #: 10 Starting weight: 261 lbs Starting date: 05/07/2019 Today's weight: 233 lbs Today's date: 10/14/2019 Total lbs lost to date: 28 lbs Total lbs lost since last in-office visit: 6 lbs  Interim History: Briana Ferguson is doing well.  She says she enjoyed [place], except for the break in (see phone note).  Subjective:   1. Prediabetes [name] has a diagnosis of prediabetes based on her elevated HgA1c and was informed this puts her at greater risk of developing diabetes. She continues to work on diet and exercise to decrease her risk of diabetes. She denies nausea or hypoglycemia.  Lab Results  Component Value Date   HGBA1C 5.9 (H) 08/27/2019   Lab Results  Component Value Date   INSULIN 18.9 05/07/2019   2. Vitamin D deficiency Briana Ferguson's Vitamin D level was 69.0 on 08/27/2019. She is currently taking prescription vitamin D 50,000 IU each week. She denies nausea, vomiting or muscle weakness. Vitamin D level is at goal.  Assessment/Plan:   1. Prediabetes Briana Ferguson will continue to work on weight loss, exercise, and decreasing simple carbohydrates to help decrease the risk of diabetes.   2. Vitamin D deficiency Low Vitamin D level contributes to fatigue and are associated with obesity, breast, and colon cancer. She agrees to continue to take prescription Vitamin D @50 ,000 IU every week and will follow-up for routine testing of Vitamin D, at least 2-3 times per year to avoid over-replacement.  3. Class 2 severe obesity with serious comorbidity and body mass index (BMI) of 35.0 to 35.9 in adult, unspecified obesity type (HCC) Briana Ferguson is currently in  the action stage of change. As such, her goal is to continue with weight loss efforts. She has agreed to the Category 2 Plan.   Exercise goals: For substantial health benefits, adults should do at least 150 minutes (2 hours and 30 minutes) a week of moderate-intensity, or 75 minutes (1 hour and 15 minutes) a week of vigorous-intensity aerobic physical activity, or an equivalent combination of moderate- and vigorous-intensity aerobic activity. Aerobic activity should be performed in episodes of at least 10 minutes, and preferably, it should be spread throughout the week.  Behavioral modification strategies: increasing lean protein intake and increasing water intake.  Briana Ferguson has agreed to follow-up with our clinic in 3 weeks. She was informed of the importance of frequent follow-up visits to maximize her success with intensive lifestyle modifications for her multiple health conditions.   Objective:   Blood pressure 122/74, pulse 78, temperature 98.2 F (36.8 C), temperature source Oral, height 5\' 8"  (1.727 m), weight 233 lb (105.7 kg), last menstrual period 04/03/2011, SpO2 99 %. Body mass index is 35.43 kg/m.  General: Cooperative, alert, well developed, in no acute distress. HEENT: Conjunctivae and lids unremarkable. Cardiovascular: Regular rhythm.  Lungs: Normal work of breathing. Neurologic: No focal deficits.   Lab Results  Component Value Date   CREATININE 0.94 05/07/2019   BUN 12 05/07/2019   NA 138 05/07/2019   K 4.3 05/07/2019   CL 99 05/07/2019   CO2 24 05/07/2019   Lab Results  Component Value Date   ALT 14 05/07/2019   AST  16 05/07/2019   ALKPHOS 95 05/07/2019   BILITOT 0.4 05/07/2019   Lab Results  Component Value Date   HGBA1C 5.9 (H) 08/27/2019   HGBA1C 6.2 (H) 05/07/2019   HGBA1C 6.5 03/19/2019   Lab Results  Component Value Date   INSULIN 18.9 05/07/2019   Lab Results  Component Value Date   TSH 2.110 05/07/2019   Lab Results  Component Value Date    CHOL 159 05/07/2019   HDL 63 05/07/2019   LDLCALC 81 05/07/2019   TRIG 80 05/07/2019   CHOLHDL 3 03/19/2019   Lab Results  Component Value Date   WBC 5.2 05/07/2019   HGB 13.3 05/07/2019   HCT 41.0 05/07/2019   MCV 87 05/07/2019   PLT 325 05/07/2019   Attestation Statements:   Reviewed by clinician on day of visit: allergies, medications, problem list, medical history, surgical history, family history, social history, and previous encounter notes.  Time spent on visit including pre-visit chart review and post-visit care and charting was 25 minutes.   I, Water quality scientist, CMA, am acting as transcriptionist for Briana Deutscher, DO  I have reviewed the above documentation for accuracy and completeness, and I agree with the above. Briana Deutscher, DO

## 2019-10-16 ENCOUNTER — Ambulatory Visit: Payer: BC Managed Care – PPO | Admitting: Internal Medicine

## 2019-10-16 ENCOUNTER — Emergency Department (HOSPITAL_BASED_OUTPATIENT_CLINIC_OR_DEPARTMENT_OTHER): Payer: BC Managed Care – PPO

## 2019-10-16 ENCOUNTER — Telehealth: Payer: Self-pay | Admitting: *Deleted

## 2019-10-16 ENCOUNTER — Encounter (HOSPITAL_BASED_OUTPATIENT_CLINIC_OR_DEPARTMENT_OTHER): Payer: Self-pay

## 2019-10-16 ENCOUNTER — Other Ambulatory Visit: Payer: Self-pay

## 2019-10-16 ENCOUNTER — Emergency Department (HOSPITAL_BASED_OUTPATIENT_CLINIC_OR_DEPARTMENT_OTHER)
Admission: EM | Admit: 2019-10-16 | Discharge: 2019-10-16 | Disposition: A | Discharge status: Home / Self Care | Payer: BC Managed Care – PPO | Attending: Emergency Medicine | Admitting: Emergency Medicine

## 2019-10-16 DIAGNOSIS — R0789 Other chest pain: Secondary | ICD-10-CM | POA: Insufficient documentation

## 2019-10-16 DIAGNOSIS — E119 Type 2 diabetes mellitus without complications: Secondary | ICD-10-CM | POA: Diagnosis not present

## 2019-10-16 DIAGNOSIS — Z7984 Long term (current) use of oral hypoglycemic drugs: Secondary | ICD-10-CM | POA: Diagnosis not present

## 2019-10-16 DIAGNOSIS — R079 Chest pain, unspecified: Secondary | ICD-10-CM

## 2019-10-16 LAB — CBC WITH DIFFERENTIAL/PLATELET
Abs Immature Granulocytes: 0.01 10*3/uL (ref 0.00–0.07)
Basophils Absolute: 0 10*3/uL (ref 0.0–0.1)
Basophils Relative: 0 %
Eosinophils Absolute: 0 10*3/uL (ref 0.0–0.5)
Eosinophils Relative: 1 %
HCT: 42.6 % (ref 36.0–46.0)
Hemoglobin: 13.5 g/dL (ref 12.0–15.0)
Immature Granulocytes: 0 %
Lymphocytes Relative: 22 %
Lymphs Abs: 1.2 10*3/uL (ref 0.7–4.0)
MCH: 27.7 pg (ref 26.0–34.0)
MCHC: 31.7 g/dL (ref 30.0–36.0)
MCV: 87.5 fL (ref 80.0–100.0)
Monocytes Absolute: 0.5 10*3/uL (ref 0.1–1.0)
Monocytes Relative: 8 %
Neutro Abs: 3.7 10*3/uL (ref 1.7–7.7)
Neutrophils Relative %: 69 %
Platelets: 288 10*3/uL (ref 150–400)
RBC: 4.87 MIL/uL (ref 3.87–5.11)
RDW: 13.5 % (ref 11.5–15.5)
WBC: 5.4 10*3/uL (ref 4.0–10.5)
nRBC: 0 % (ref 0.0–0.2)

## 2019-10-16 LAB — BASIC METABOLIC PANEL
Anion gap: 10 (ref 5–15)
BUN: 15 mg/dL (ref 6–20)
CO2: 24 mmol/L (ref 22–32)
Calcium: 9.1 mg/dL (ref 8.9–10.3)
Chloride: 102 mmol/L (ref 98–111)
Creatinine, Ser: 0.97 mg/dL (ref 0.44–1.00)
GFR calc Af Amer: >60 mL/min (ref >60)
GFR calc non Af Amer: >60 mL/min (ref >60)
Glucose, Bld: 107 mg/dL — ABNORMAL HIGH (ref 70–99)
Potassium: 3.9 mmol/L (ref 3.5–5.1)
Sodium: 136 mmol/L (ref 135–145)

## 2019-10-16 LAB — TROPONIN I (HIGH SENSITIVITY)
Troponin I (High Sensitivity): <2 ng/L (ref <18)
Troponin I (High Sensitivity): 2 ng/L (ref <18)

## 2019-10-16 MED ORDER — ALUM & MAG HYDROXIDE-SIMETH 200-200-20 MG/5ML PO SUSP
30.0000 mL | Freq: Once | ORAL | Status: AC
  Administered 2019-10-16: 30 mL via ORAL
  Filled 2019-10-16: qty 30

## 2019-10-16 NOTE — Telephone Encounter (Signed)
Patient called nurse triage this morning at 8:28AM. Patient reports she is hurting in the middle of chest where esophagus is and is also having throat pain; started yesterday took antacid yesterday and did help but continues to hurt -middle of chest and rolls up into her esophagus.

## 2019-10-16 NOTE — Telephone Encounter (Signed)
Briana Ferguson called the after hours line 10/15/2019 at 10:06 PM. Briana Ferguson reports she was having indigestion this morning and through the day and now it feels like chest pain. It hurts in the middle. She is burping a lot and taking a deep breath causes the pain to continue on. The pain is coming and going.  Briana Ferguson was advised to call 911. Briana Ferguson reports  isn't sure what she's going to do. Isn't sure if she is going to call 911, drive to the ER or stay home and wait and see.   Briana Ferguson has an appointment with Dr. Jerilee Hoh at 10:30 AM today.

## 2019-10-16 NOTE — Telephone Encounter (Signed)
FYI Patient has arrived at the ED.

## 2019-10-16 NOTE — Telephone Encounter (Signed)
I would advise ED eval first if pain is that severe to r/o any life-threatening causes. She can f/u with [place] afterwards if cleared from the ED.,

## 2019-10-16 NOTE — Discharge Instructions (Signed)
Try zantac or pepcid twice a day.  Try to avoid things that may make this worse, most commonly these are spicy foods tomato based products fatty foods chocolate and peppermint.  Alcohol and tobacco can also make this worse.  Return to the emergency department for sudden worsening pain fever or inability to eat or drink. ° °

## 2019-10-16 NOTE — ED Triage Notes (Signed)
Pt arrives with c/o central CP with radiation into back Since last night. Pt also reports some radiation to left shoulder. C/O nausea last night with some lightheadedness.

## 2019-10-16 NOTE — ED Notes (Signed)
ED Provider at bedside. 

## 2019-10-16 NOTE — ED Provider Notes (Signed)
[facility] Provider Note   CSN: [id number] Arrival date & time: 10/16/19  1032     History Chief Complaint  Patient presents with  . Chest Pain    [facility] is a 59 y.o. female.  59 yo F with a chief complaint chest pain.  This is in the center of her chest and radiates up into her neck.  Started yesterday morning.  Has occurred off and on.  Nonexertional.  States it feels it comes in waves.  Last night she tried to go to sleep and when she laid on the left right side it was improved on the left side it got much worse.  She denies shortness of breath with this.  Had an episode of nausea but no vomiting.  Denies history of prior MI.  Denies history of hypertension hyperlipidemia smoking.  She does have a history of diabetes.  Mother and father had MIs in their 61s.  She denies hemoptysis denies unilateral lower extremity edema denies recent surgery immobilization or hospitalization.  Denies estrogen use.  Denies cancer.  She states this pain feels just like when she had reflux in the past.  She took a Pepcid without significant improvement and tried Pepto-Bismol.  The history is provided by the patient.  Chest Pain Pain location:  Substernal area Pain quality: aching and hot   Pain radiates to:  Neck Pain severity:  Moderate Onset quality:  Gradual Duration:  1 day Timing:  Constant Progression:  Unchanged Chronicity:  Recurrent Relieved by:  Nothing Worsened by:  Certain positions Ineffective treatments:  None tried Associated symptoms: no dizziness, no fever, no headache, no nausea, no palpitations, no shortness of breath and no vomiting        Past Medical History:  Diagnosis Date  . Anxiety   . Arthritis   . Attention and concentration deficit 2011   had med trial Dr Candis Schatz ? dx   . Back pain   . Bilateral carpal tunnel syndrome   . Dyspnea   . Environmental allergies    Cats  . GERD (gastroesophageal reflux disease)   .  IBS (irritable bowel syndrome)   . Joint pain   . Lactose intolerance   . Lower extremity edema   . Multiple food allergies   . Prediabetes   . Sleep apnea   . Swallowing difficulty   . Vitamin D deficiency     Patient Active Problem List   Diagnosis Date Noted  . Healthcare maintenance 12/20/2018  . Snoring 01/27/2015  . Elevated blood pressure reading 01/28/2014  . Other fatigue 01/28/2014  . Visit for preventive health examination 01/28/2014  . BMI 36.0-36.9,adult 12/28/2011  . CTS (carpal tunnel syndrome) 12/28/2011  . History of falling 03/16/2011  . Dizziness 03/16/2011  . Numbness 03/16/2011  . OSA (obstructive sleep apnea) 11/22/2010  . Routine general medical examination at a health care facility 11/22/2010  . Routine gynecological examination 11/22/2010  . GOITER, UNSPECIFIED 11/22/2007  . BACK PAIN 11/22/2007  . NECK PAIN 08/14/2007  . VAGINITIS 07/24/2007  . HOARSENESS 07/24/2007  . ALLERGIC RHINITIS 11/27/2006    Past Surgical History:  Procedure Laterality Date  . WISDOM TOOTH EXTRACTION       OB History    Gravida  3   Para      Term      Preterm      AB      Living  3     SAB  TAB      Ectopic      Multiple      Live Births              Family History  Problem Relation Age of Onset  . Diabetes Father   . Stroke Father        deceased  . Heart disease Father   . Sleep apnea Father   . Hypertension Father   . Hyperlipidemia Father   . Sudden death Father   . Diabetes Mother   . Stroke Mother   . Sleep apnea Mother   . Hypertension Mother   . Heart disease Mother   . Thyroid disease Mother   . Obesity Mother   . Hyperlipidemia Other        fhx  . Cancer Other        granfather/liver    Social History   Tobacco Use  . Smoking status: Never Smoker  . Smokeless tobacco: Never Used  Substance Use Topics  . Alcohol use: No    Comment: none  . Drug use: No    Home Medications Prior to Admission  medications   Medication Sig Start Date End Date Taking? Authorizing Provider  ascorbic acid (VITAMIN C) 500 MG tablet Take 500 mg by mouth daily.    [provider]  cholecalciferol (VITAMIN D3) 25 MCG (1000 UNIT) tablet Take 1,000 Units by mouth daily.    [provider]  clindamycin (CLEOCIN T) 1 % lotion Apply 1 application topically 2 (two) times daily. 10/08/19   [provider]  fluticasone Asencion Islam) 50 MCG/ACT nasal spray 2 sprays each nostril as needed 01/25/19   Panosh, Standley Brooking, MD  Iron-Vitamins (GERITOL COMPLETE PO) Take 1 tablet by mouth daily.    [provider]  MAGNESIUM PO Take 2,600 mg by mouth as needed.     [provider]  metFORMIN (GLUCOPHAGE) 500 MG tablet Take 1 tablet (500 mg total) by mouth daily with breakfast. 08/29/19   Briscoe Deutscher, DO  Rhubarb (ESTROVERA) 4 MG TABS Take 1 tablet by mouth daily.    [provider]  spironolactone (ALDACTONE) 100 MG tablet Take 100 mg by mouth daily. 08/27/19   [provider]  tretinoin (RETIN-A) 0.05 % cream Apply 1 application topically at bedtime. 10/08/19   [provider]  vitamin B-12 (CYANOCOBALAMIN) 1000 MCG tablet Take 1,000 mcg by mouth daily.    [provider]  Vitamin D, Ergocalciferol, (DRISDOL) 1.25 MG (50000 UNIT) CAPS capsule Take 1 capsule (50,000 Units total) by mouth every 7 (seven) days. 08/29/19   Briscoe Deutscher, DO    Allergies    Sulfamethoxazole-trimethoprim  Review of Systems   Review of Systems  Constitutional: Negative for chills and fever.  HENT: Negative for congestion and rhinorrhea.   Eyes: Negative for redness and visual disturbance.  Respiratory: Negative for shortness of breath and wheezing.   Cardiovascular: Positive for chest pain. Negative for palpitations.  Gastrointestinal: Negative for nausea and vomiting.  Genitourinary: Negative for dysuria and urgency.  Musculoskeletal: Negative for arthralgias and myalgias.    Skin: Negative for pallor and wound.  Neurological: Negative for dizziness and headaches.    Physical Exam Updated Vital Signs BP 133/81 (BP Location: Left Arm)   Pulse 84   Temp 98.9 F (37.2 C) (Oral)   Resp 18   Ht 5\' 8"  (1.727 m)   Wt 105.7 kg   LMP 04/03/2011   SpO2 100%   BMI 35.43  kg/m   Physical Exam Vitals and nursing note reviewed.  Constitutional:      General: She is not in acute distress.    Appearance: She is well-developed. She is not diaphoretic.  HENT:     Head: Normocephalic and atraumatic.  Eyes:     Pupils: Pupils are equal, round, and reactive to light.  Cardiovascular:     Rate and Rhythm: Normal rate and regular rhythm.     Heart sounds: No murmur heard.  No friction rub. No gallop.   Pulmonary:     Effort: Pulmonary effort is normal.     Breath sounds: No wheezing or rales.  Abdominal:     General: There is no distension.     Palpations: Abdomen is soft.     Tenderness: There is no abdominal tenderness.  Musculoskeletal:        General: No tenderness.     Cervical back: Normal range of motion and neck supple.  Skin:    General: Skin is warm and dry.  Neurological:     Mental Status: She is alert and oriented to person, place, and time.  Psychiatric:        Behavior: Behavior normal.     ED Results / Procedures / Treatments   Labs (all labs ordered are listed, but only abnormal results are displayed) Labs Reviewed  BASIC METABOLIC PANEL - Abnormal; Notable for the following components:      Result Value   Glucose, Bld 107 (*)    All other components within normal limits  CBC WITH DIFFERENTIAL/PLATELET  TROPONIN I (HIGH SENSITIVITY)  TROPONIN I (HIGH SENSITIVITY)    EKG EKG Interpretation  Date/Time:  Wednesday October 16 2019 10:43:30 EDT Ventricular Rate:  88 PR Interval:    QRS Duration: 86 QT Interval:  349 QTC Calculation: 423 R Axis:   -44 Text Interpretation: Sinus rhythm Left axis deviation Abnormal R-wave  progression, early transition ST elevation, consider inferior injury Baseline wander in lead(s) II III aVR aVL aVF V1 V2 V3 V4 V5 V6 No old tracing to compare Confirmed by Deno Etienne [phone]) on 10/16/2019 11:00:20 AM   Radiology DG Chest 2 View  Result Date: 10/16/2019 CLINICAL DATA:  Chest pain. Additional history provided: Central chest pain with radiation into back as well as left shoulder. Nausea with some lightheadedness. EXAM: CHEST - 2 VIEW COMPARISON:  Prior chest radiographs 01/05/2016. FINDINGS: Heart size within normal limits. There is no appreciable airspace consolidation. No evidence of pleural effusion or pneumothorax. No acute bony abnormality identified. Thoracic spondylosis. IMPRESSION: No evidence of acute cardiopulmonary abnormality. Electronically Signed   By: Kellie Simmering DO   On: 10/16/2019 11:44    Procedures Procedures (including critical care time)  Medications Ordered in ED Medications  alum & mag hydroxide-simeth (MAALOX/MYLANTA) 200-200-20 MG/5ML suspension 30 mL (30 mLs Oral Given 10/16/19 1125)    ED Course  I have reviewed the triage vital signs and the nursing notes.  Pertinent labs & imaging results that were available during my care of the patient were reviewed by me and considered in my medical decision making (see chart for details).    MDM Rules/Calculators/A&P                          60 yo F with a chief complaint of chest pain.  Most likely reflux by history.  Will obtain a laboratory evaluation chest x-ray treat with a GI cocktail and reassess.  Delta  troponin.  Delta negative.  Mild improvement with GI cocktail.  Discharge home.  7:21 AM:  I have discussed the diagnosis/risks/treatment options with the patient and family and believe the pt to be eligible for discharge home to follow-up with PCP. We also discussed returning to the ED immediately if new or worsening sx occur. We discussed the sx which are most concerning (e.g., sudden worsening  pain, fever, inability to tolerate by mouth) that necessitate immediate return. Medications administered to the patient during their visit and any new prescriptions provided to the patient are listed below.  Medications given during this visit Medications  alum & mag hydroxide-simeth (MAALOX/MYLANTA) 200-200-20 MG/5ML suspension 30 mL (30 mLs Oral Given 10/16/19 1125)     The patient appears reasonably screen and/or stabilized for discharge and I doubt any other medical condition or other [facility] requiring further screening, evaluation, or treatment in the ED at this time prior to discharge.   Final Clinical Impression(s) / ED Diagnoses Final diagnoses:  Nonspecific chest pain    Rx / DC Orders ED Discharge Orders    None       Deno Etienne, DO 10/17/19 [id number]

## 2019-10-16 NOTE — Telephone Encounter (Signed)
Advised patient to to the ED for ongoing chest pain. Patient verbally agreed.  Patient will be going to [facility] that is close to her home. Will observe for patient's arrival.

## 2019-10-24 ENCOUNTER — Encounter: Payer: Self-pay | Admitting: Internal Medicine

## 2019-10-24 ENCOUNTER — Other Ambulatory Visit: Payer: Self-pay

## 2019-10-24 ENCOUNTER — Telehealth (INDEPENDENT_AMBULATORY_CARE_PROVIDER_SITE_OTHER): Payer: BC Managed Care – PPO | Admitting: Internal Medicine

## 2019-10-24 VITALS — Temp 96.9°F | Ht 68.0 in | Wt 234.0 lb

## 2019-10-24 DIAGNOSIS — R7303 Prediabetes: Secondary | ICD-10-CM

## 2019-10-24 DIAGNOSIS — R1310 Dysphagia, unspecified: Secondary | ICD-10-CM | POA: Diagnosis not present

## 2019-10-24 DIAGNOSIS — R079 Chest pain, unspecified: Secondary | ICD-10-CM

## 2019-10-24 DIAGNOSIS — Z86010 Personal history of colonic polyps: Secondary | ICD-10-CM | POA: Diagnosis not present

## 2019-10-24 DIAGNOSIS — Z6836 Body mass index (BMI) 36.0-36.9, adult: Secondary | ICD-10-CM

## 2019-10-24 NOTE — Progress Notes (Signed)
Virtual Visit via Video Note  I connected with@ on 10/24/19 at  9:00 AM EDT by a video enabled telemedicine application and verified that I am speaking with the correct person using two identifiers. Location patient: home Location provider home office Persons participating in the virtual visit: patient, provider  [place] national recommendations  regarding COVID 19 pandemic   video visit is advised over in office visit for this patient.  Patient aware  of the limitations of evaluation and management by telemedicine and  availability of in person appointments. and agreed to proceed.   HPI: [facility] presents for video visit  Fu ed visit for atypical /NS chest pain on June 23  Non exertional left sided  Waxing and waning  ekg lad   rx with  maalox Gi cocktail   Delta negative  c xray nad  toracic spodlyisis She is a lot better since ED visit  taking pepcid once a day  She describes  Insidious onset heart but not  like on fathers day and then after  Became more frequent and severe  tues night awake in inght with extreme nausea went to br to vomitng but just had fluid . Not active vomiting  Chest discomfort . Took pepcid once   Triage   Advised ed visit  No assoc sob but pain some worse with deep breathing and no cough   On retrospect may have had  Episode of   Pill wallowing difficulty predating  Theses episodes .  And has had some times feel like food getting stuck upper chest .   Had episodes of hands itching   Took benadryl... Due for colon in sept oct Dr Hilarie Fredrickson  Had polyp last  Colon and needed  Earlier fu colon.  ROS: See pertinent positives and negatives per HPI. No fever change in bowels  In weight management and going well   had eco jan 2021  fo fluttering feeling  Under care for obesity management  Past Medical History:  Diagnosis Date  . Anxiety   . Arthritis   . Attention and concentration deficit 2011   had med trial Dr Candis Schatz ? dx   . Back pain   . Bilateral carpal  tunnel syndrome   . Dyspnea   . Environmental allergies    Cats  . GERD (gastroesophageal reflux disease)   . IBS (irritable bowel syndrome)   . Joint pain   . Lactose intolerance   . Lower extremity edema   . Multiple food allergies   . Prediabetes   . Sleep apnea   . Swallowing difficulty   . Vitamin D deficiency     Past Surgical History:  Procedure Laterality Date  . WISDOM TOOTH EXTRACTION      Family History  Problem Relation Age of Onset  . Diabetes Father   . Stroke Father        deceased  . Heart disease Father   . Sleep apnea Father   . Hypertension Father   . Hyperlipidemia Father   . Sudden death Father   . Diabetes Mother   . Stroke Mother   . Sleep apnea Mother   . Hypertension Mother   . Heart disease Mother   . Thyroid disease Mother   . Obesity Mother   . Hyperlipidemia Other        fhx  . Cancer Other        granfather/liver    Social History   Tobacco Use  . Smoking status:  Never Smoker  . Smokeless tobacco: Never Used  Vaping Use  . Vaping Use: Never used  Substance Use Topics  . Alcohol use: No    Comment: none  . Drug use: No      Current Outpatient Medications:  .  ascorbic acid (VITAMIN C) 500 MG tablet, Take 500 mg by mouth daily., Disp: , Rfl:  .  Cholecalciferol (VITAMIN D) 50 MCG (2000 UT) CAPS, Take 1 capsule by mouth daily., Disp: , Rfl:  .  clindamycin (CLEOCIN T) 1 % lotion, Apply 1 application topically 2 (two) times daily., Disp: , Rfl:  .  fluticasone (FLONASE) 50 MCG/ACT nasal spray, 2 sprays each nostril as needed, Disp: 16 g, Rfl: 12 .  Iron-Vitamins (GERITOL COMPLETE PO), Take 1 tablet by mouth daily., Disp: , Rfl:  .  MAGNESIUM PO, Take 2,600 mg by mouth as needed. , Disp: , Rfl:  .  metFORMIN (GLUCOPHAGE) 500 MG tablet, Take 1 tablet (500 mg total) by mouth daily with breakfast., Disp: 90 tablet, Rfl: 0 .  spironolactone (ALDACTONE) 100 MG tablet, Take 100 mg by mouth daily., Disp: , Rfl:  .  tretinoin  (RETIN-A) 0.05 % cream, Apply 1 application topically at bedtime., Disp: , Rfl:  .  vitamin B-12 (CYANOCOBALAMIN) 1000 MCG tablet, Take 1,000 mcg by mouth daily., Disp: , Rfl:  .  cholecalciferol (VITAMIN D3) 25 MCG (1000 UNIT) tablet, Take 1,000 Units by mouth daily. (Patient not taking: Reported on 10/24/2019), Disp: , Rfl:  .  Rhubarb (ESTROVERA) 4 MG TABS, Take 1 tablet by mouth daily., Disp: , Rfl:  .  tretinoin (RETIN-A) 0.025 % cream, Apply topically. (Patient not taking: Reported on 10/24/2019), Disp: , Rfl:  .  Vitamin D, Ergocalciferol, (DRISDOL) 1.25 MG (50000 UNIT) CAPS capsule, Take 1 capsule (50,000 Units total) by mouth every 7 (seven) days. (Patient not taking: Reported on 10/24/2019), Disp: 4 capsule, Rfl: 0  EXAM: BP Readings from Last 3 Encounters:  10/16/19 133/81  10/14/19 122/74  09/18/19 121/67    VITALS per patient if applicable:  GENERAL: alert, oriented, appears well and in no acute distress  HEENT: atraumatic, conjunttiva clear, no obvious abnormalities on inspection of external nose and ears  NECK: normal movements of the head and neck  LUNGS: on inspection no signs of respiratory distress, breathing rate appears normal, no obvious gross SOB, gasping or wheezing  CV: no obvious cyanosis  PSYCH/NEURO: pleasant and cooperative, no obvious depression or anxiety, speech and thought processing grossly intact Lab Results  Component Value Date   WBC 5.4 10/16/2019   HGB 13.5 10/16/2019   HCT 42.6 10/16/2019   PLT 288 10/16/2019   GLUCOSE 107 (H) 10/16/2019   CHOL 159 05/07/2019   TRIG 80 05/07/2019   HDL 63 05/07/2019   LDLCALC 81 05/07/2019   ALT 14 05/07/2019   AST 16 05/07/2019   NA 136 10/16/2019   K 3.9 10/16/2019   CL 102 10/16/2019   CREATININE 0.97 10/16/2019   BUN 15 10/16/2019   CO2 24 10/16/2019   TSH 2.110 05/07/2019   HGBA1C 5.9 (H) 08/27/2019   Reviewed ed visit eval  ASSESSMENT AND PLAN:  Discussed the following assessment and  plan:    ICD-10-CM   1. Chest pain, unspecified type  R07.9    sounds esophageal  by hx and response  2. Dysphagia, unspecified type  R13.10   3. History of colonic polyps  Z86.010   4. Class 2 severe obesity with serious comorbidity and  body mass index (BMI) of 36.0 to 36.9 in adult, unspecified obesity type ([place])  E66.01    Z68.36    says doing well in weight managment clinic  5. Prediabetes  R73.03    Caution with pill swallowing  Since much improved  Stay on pepcid  Take bid( instead of switching to ppi )   For the next 3-4 weeks  Arrange  appt with GI  Dr Luz Brazen al .  She is due for  Colon in the fall ? If  egd is indicated   depending on progress . Reflux precautoins  Doesn't sound biliary but cannot explain the episode of itchy hands  Counseled.   Expectant management and discussion of plan and treatment with opportunity to ask questions and all were answered. The patient agreed with the plan and demonstrated an understanding of the instructions.  Advised to call back or seek an in-person evaluation if worsening  or having  further concerns . Return if symptoms worsen or fail to improve as expected, for plan fu GI in 3-4 weeks .   Shanon Ace, MD

## 2019-11-06 ENCOUNTER — Encounter (INDEPENDENT_AMBULATORY_CARE_PROVIDER_SITE_OTHER): Payer: Self-pay | Admitting: Family Medicine

## 2019-11-06 ENCOUNTER — Other Ambulatory Visit: Payer: Self-pay

## 2019-11-06 ENCOUNTER — Ambulatory Visit (INDEPENDENT_AMBULATORY_CARE_PROVIDER_SITE_OTHER): Payer: BC Managed Care – PPO | Admitting: Family Medicine

## 2019-11-06 VITALS — BP 129/82 | HR 55 | Temp 98.3°F | Ht 68.0 in | Wt 237.0 lb

## 2019-11-06 DIAGNOSIS — R1310 Dysphagia, unspecified: Secondary | ICD-10-CM

## 2019-11-06 DIAGNOSIS — E559 Vitamin D deficiency, unspecified: Secondary | ICD-10-CM

## 2019-11-06 DIAGNOSIS — Z9189 Other specified personal risk factors, not elsewhere classified: Secondary | ICD-10-CM | POA: Diagnosis not present

## 2019-11-06 DIAGNOSIS — R7303 Prediabetes: Secondary | ICD-10-CM | POA: Diagnosis not present

## 2019-11-06 DIAGNOSIS — Z6836 Body mass index (BMI) 36.0-36.9, adult: Secondary | ICD-10-CM

## 2019-11-06 NOTE — Progress Notes (Signed)
Chief Complaint:   OBESITY [facility] is here to discuss her progress with her obesity treatment plan along with follow-up of her obesity related diagnoses. [facility] is on the Category 2 Plan and states she is following her eating plan approximately 60% of the time. [facility] states she is walking steps 5 times per week.  Today's visit was #: 3 Starting weight: 261 lbs Starting date: 05/07/2019 Today's weight: 237 lbs Today's date: 11/06/2019 Total lbs lost to date: 24 lbs Total lbs lost since last in-office visit: 0  Interim History: [facility] says her mom has been hospitalized with new onset Afib.  Her fridge died.  She reports that she has been eating out too often.  Subjective:   1. Dysphagia Possible esophagitis recently.  Improving.  2. Prediabetes [facility] has a diagnosis of prediabetes based on her elevated HgA1c and was informed this puts her at greater risk of developing diabetes. She continues to work on diet and exercise to decrease her risk of diabetes. She denies nausea or hypoglycemia.  Lab Results  Component Value Date   HGBA1C 5.9 (H) 08/27/2019   Lab Results  Component Value Date   INSULIN 18.9 05/07/2019   3. Vitamin D deficiency Bri[facility]'s Vitamin D level was 69.0 on 08/27/2019. She is currently taking prescription vitamin D 50,000 IU each week. She denies nausea, vomiting or muscle weakness.  Assessment/Plan:   1. Dysphagia, unspecified type [name] will follow-up with GI to discuss EGD.  2. Prediabetes [facility] will continue to work on weight loss, exercise, and decreasing simple carbohydrates to help decrease the risk of diabetes.   3. Vitamin D deficiency Low Vitamin D level contributes to fatigue and are associated with obesity, breast, and colon cancer. She agrees to continue to take prescription Vitamin D @50 ,000 IU every week and will follow-up for routine testing of Vitamin D, at least 2-3 times per year to avoid over-replacement.  4. At risk for heart  disease Bri[facility] was given approximately 15 minutes of coronary artery disease prevention counseling today. She is 59 y.o. female and has risk factors for heart disease including obesity. We discussed intensive lifestyle modifications today with an emphasis on specific weight loss instructions and strategies.   Repetitive spaced learning was employed today to elicit superior memory formation and behavioral change.  5. Class 2 severe obesity with serious comorbidity and body mass index (BMI) of 36.0 to 36.9 in adult, unspecified obesity type (HCC) [facility] is currently in the action stage of change. As such, her goal is to continue with weight loss efforts. She has agreed to the Category 2 Plan.   Exercise goals: For substantial health benefits, adults should do at least 150 minutes (2 hours and 30 minutes) a week of moderate-intensity, or 75 minutes (1 hour and 15 minutes) a week of vigorous-intensity aerobic physical activity, or an equivalent combination of moderate- and vigorous-intensity aerobic activity. Aerobic activity should be performed in episodes of at least 10 minutes, and preferably, it should be spread throughout the week.  Behavioral modification strategies: increasing lean protein intake.  Briana [facility] has agreed to follow-up with our clinic in 3-4 weeks. She was informed of the importance of frequent follow-up visits to maximize her success with intensive lifestyle modifications for her multiple health conditions.   Objective:   Blood pressure 129/82, pulse (!) 55, temperature 98.3 F (36.8 C), temperature source Oral, height 5\' 8"  (1.727 m), weight 237 lb (107.5 kg), last menstrual period 04/03/2011, SpO2 99 %. Body mass index is  36.04 kg/m.  General: Cooperative, alert, well developed, in no acute distress. HEENT: Conjunctivae and lids unremarkable. Cardiovascular: Regular rhythm.  Lungs: Normal work of breathing. Neurologic: No focal deficits.   Lab Results  Component Value Date    CREATININE 0.97 10/16/2019   BUN 15 10/16/2019   NA 136 10/16/2019   K 3.9 10/16/2019   CL 102 10/16/2019   CO2 24 10/16/2019   Lab Results  Component Value Date   ALT 14 05/07/2019   AST 16 05/07/2019   ALKPHOS 95 05/07/2019   BILITOT 0.4 05/07/2019   Lab Results  Component Value Date   HGBA1C 5.9 (H) 08/27/2019   HGBA1C 6.2 (H) 05/07/2019   HGBA1C 6.5 03/19/2019   Lab Results  Component Value Date   INSULIN 18.9 05/07/2019   Lab Results  Component Value Date   TSH 2.110 05/07/2019   Lab Results  Component Value Date   CHOL 159 05/07/2019   HDL 63 05/07/2019   LDLCALC 81 05/07/2019   TRIG 80 05/07/2019   CHOLHDL 3 03/19/2019   Lab Results  Component Value Date   WBC 5.4 10/16/2019   HGB 13.5 10/16/2019   HCT 42.6 10/16/2019   MCV 87.5 10/16/2019   PLT 288 10/16/2019   Attestation Statements:   Reviewed by clinician on day of visit: allergies, medications, problem list, medical history, surgical history, family history, social history, and previous encounter notes.  I, Water quality scientist, CMA, am acting as transcriptionist for Briscoe Deutscher, DO  I have reviewed the above documentation for accuracy and completeness, and I agree with the above. Briscoe Deutscher, DO

## 2019-11-26 ENCOUNTER — Ambulatory Visit (INDEPENDENT_AMBULATORY_CARE_PROVIDER_SITE_OTHER): Payer: BC Managed Care – PPO | Admitting: Family Medicine

## 2019-11-27 ENCOUNTER — Ambulatory Visit (INDEPENDENT_AMBULATORY_CARE_PROVIDER_SITE_OTHER): Payer: BC Managed Care – PPO | Admitting: Family Medicine

## 2019-12-13 ENCOUNTER — Encounter: Payer: Self-pay | Admitting: *Deleted

## 2020-01-07 ENCOUNTER — Ambulatory Visit: Payer: BC Managed Care – PPO | Admitting: Internal Medicine

## 2020-01-20 ENCOUNTER — Encounter: Payer: Self-pay | Admitting: Internal Medicine

## 2020-01-20 ENCOUNTER — Ambulatory Visit: Payer: BC Managed Care – PPO | Admitting: Internal Medicine

## 2020-01-20 VITALS — BP 112/74 | HR 77 | Ht 68.0 in | Wt 240.0 lb

## 2020-01-20 DIAGNOSIS — K219 Gastro-esophageal reflux disease without esophagitis: Secondary | ICD-10-CM | POA: Diagnosis not present

## 2020-01-20 DIAGNOSIS — R1310 Dysphagia, unspecified: Secondary | ICD-10-CM | POA: Diagnosis not present

## 2020-01-20 DIAGNOSIS — Z86010 Personal history of colonic polyps: Secondary | ICD-10-CM | POA: Diagnosis not present

## 2020-01-20 MED ORDER — SUPREP BOWEL PREP KIT 17.5-3.13-1.6 GM/177ML PO SOLN
1.0000 | ORAL | 0 refills | Status: DC
Start: 2020-01-20 — End: 2020-03-11

## 2020-01-20 MED ORDER — FAMOTIDINE 20 MG PO TABS
20.0000 mg | ORAL_TABLET | Freq: Two times a day (BID) | ORAL | 1 refills | Status: DC | PRN
Start: 2020-01-20 — End: 2022-07-20

## 2020-01-20 NOTE — Progress Notes (Signed)
Patient ID: HOWARD PATTON, female   DOB: 08-Oct-1960, 59 y.o.   MRN: [id number] HPI: Briana Ferguson is a 59 year old female with a past medical history of GERD, small adenomatous colon polyp, prediabetes, obesity, sleep apnea who is seen in consult at the request of Dr. Regis Bill and Dr. Juleen China to evaluate esophageal dysphagia and to discuss colon polyp surveillance.  She is here alone today.  She reports that she does have a history of heartburn however this had become severe several months ago.  She was having frequent heartburn and indigestion symptom and developed solid food dysphagia.  Solid food dysphagia symptom was intermittent but worse with dry foods such as bread and meat.  She was treated with over-the-counter famotidine 20 mg twice daily which she took for 6 to 8 weeks.  This has significantly improved her heartburn symptoms and her dysphagia symptoms have also improved.  She reports that she has been watching her diet and avoiding trigger foods for her reflux such as sodas and bread.  She does still drink coffee.  She is [facility] and has lost 24 pounds.  She reports her bowel movements are regular but occasionally hard to get started.  This is not an issue if she eats it apple daily.  She seen no blood in her stool or melena.  She denies abdominal pain.  She had a screening colonoscopy performed on 02/19/2015.  2 polyps were removed from the ascending colon and hepatic flexure, the largest being 5 mm in size.  The exam was otherwise normal.  One polyp was an adenoma the other benign polypoid tissue.  Past Medical History:  Diagnosis Date  . Anxiety   . Arthritis   . Attention and concentration deficit 2011   had med trial Dr Candis Schatz ? dx   . Back pain   . Bilateral carpal tunnel syndrome   . Dyspnea   . Environmental allergies    Cats  . GERD (gastroesophageal reflux disease)   . IBS (irritable bowel syndrome)   . Joint pain   . Lactose  intolerance   . Lower extremity edema   . Multiple food allergies   . Prediabetes   . Sleep apnea   . Swallowing difficulty   . Tubular adenoma of colon   . Vitamin D deficiency     Past Surgical History:  Procedure Laterality Date  . NO PAST SURGERIES      Outpatient Medications Prior to Visit  Medication Sig Dispense Refill  . ascorbic acid (VITAMIN C) 500 MG tablet Take 500 mg by mouth daily.    . Cholecalciferol (VITAMIN D) 50 MCG (2000 UT) CAPS Take 1 capsule by mouth daily.    . clindamycin (CLEOCIN T) 1 % lotion Apply 1 application topically 2 (two) times daily.    . fluticasone (FLONASE) 50 MCG/ACT nasal spray 2 sprays each nostril as needed 16 g 12  . Iron-Vitamins (GERITOL COMPLETE PO) Take 1 tablet by mouth daily.    Marland Kitchen MAGNESIUM PO Take 2,600 mg by mouth as needed.     . metFORMIN (GLUCOPHAGE) 500 MG tablet Take 1 tablet (500 mg total) by mouth daily with breakfast. 90 tablet 0  . spironolactone (ALDACTONE) 100 MG tablet Take 100 mg by mouth daily.    Marland Kitchen tretinoin (RETIN-A) 0.05 % cream Apply 1 application topically at bedtime.    . vitamin B-12 (CYANOCOBALAMIN) 1000 MCG tablet Take 1,000 mcg by mouth daily.     No facility-administered  medications prior to visit.    Allergies  Allergen Reactions  . Other Other (See Comments)  . Sulfamethoxazole-Trimethoprim     REACTION: feels faint/per pt not sure if allergic to med    Family History  Problem Relation Age of Onset  . Diabetes Father   . Stroke Father        deceased  . Heart disease Father   . Sleep apnea Father   . Hypertension Father   . Hyperlipidemia Father   . Sudden death Father   . Diabetes Mother   . Stroke Mother   . Sleep apnea Mother   . Hypertension Mother   . Heart disease Mother   . Thyroid disease Mother   . Obesity Mother   . Hyperlipidemia Other        fhx  . Cancer Other        granfather/liver  . Diabetes Maternal Grandmother   . Heart disease Maternal Grandmother   . Liver  cancer Maternal Grandfather   . Diabetes Paternal Grandmother   . Heart disease Paternal Grandmother   . Colon cancer Neg Hx   . Stomach cancer Neg Hx   . Pancreatic cancer Neg Hx     Social History   Tobacco Use  . Smoking status: Never Smoker  . Smokeless tobacco: Never Used  Vaping Use  . Vaping Use: Never used  Substance Use Topics  . Alcohol use: No    Comment: none  . Drug use: No    ROS: As per history of present illness, otherwise negative  BP 112/74 (BP Location: Left Arm, Patient Position: Sitting, Cuff Size: Large)   Pulse 77   Ht 5\' 8"  (1.727 m)   Wt 240 lb (108.9 kg)   LMP 04/03/2011   SpO2 98%   BMI 36.49 kg/m  Gen: awake, alert, NAD HEENT: anicteric CV: RRR, no mrg Pulm: CTA b/l Abd: soft, NT/ND, +BS throughout Ext: no c/c/e Neuro: nonfocal   RELEVANT LABS AND IMAGING: CBC    Component Value Date/Time   WBC 5.4 10/16/2019 1133   RBC 4.87 10/16/2019 1133   HGB 13.5 10/16/2019 1133   HGB 13.3 05/07/2019 1235   HCT 42.6 10/16/2019 1133   HCT 41.0 05/07/2019 1235   PLT 288 10/16/2019 1133   PLT 325 05/07/2019 1235   MCV 87.5 10/16/2019 1133   MCV 87 05/07/2019 1235   MCH 27.7 10/16/2019 1133   MCHC 31.7 10/16/2019 1133   RDW 13.5 10/16/2019 1133   RDW 13.0 05/07/2019 1235   LYMPHSABS 1.2 10/16/2019 1133   LYMPHSABS 1.7 05/07/2019 1235   MONOABS 0.5 10/16/2019 1133   EOSABS 0.0 10/16/2019 1133   EOSABS 0.0 05/07/2019 1235   BASOSABS 0.0 10/16/2019 1133   BASOSABS 0.0 05/07/2019 1235    CMP     Component Value Date/Time   NA 136 10/16/2019 1133   NA 138 05/07/2019 1235   K 3.9 10/16/2019 1133   CL 102 10/16/2019 1133   CO2 24 10/16/2019 1133   GLUCOSE 107 (H) 10/16/2019 1133   BUN 15 10/16/2019 1133   BUN 12 05/07/2019 1235   CREATININE 0.97 10/16/2019 1133   CALCIUM 9.1 10/16/2019 1133   PROT 7.3 05/07/2019 1235   ALBUMIN 4.2 05/07/2019 1235   AST 16 05/07/2019 1235   ALT 14 05/07/2019 1235   ALKPHOS 95 05/07/2019 1235    BILITOT 0.4 05/07/2019 1235   GFRNONAA >60 10/16/2019 1133   GFRAA >60 10/16/2019 1133    ASSESSMENT/PLAN:  59 year old female with a past medical history of GERD, small adenomatous colon polyp, prediabetes, obesity, sleep apnea who is seen in consult at the request of Dr. Regis Bill and Dr. Juleen China to evaluate esophageal dysphagia and to discuss colon polyp surveillance.  1.  GERD with dysphagia symptom --she was having GERD several months ago along with dysphagia.  I do have suspicion for esophagitis which improved with twice daily H2 blocker.  She has now stopped this medication.  I have recommended that we perform upper endoscopy to evaluate esophageal reflux disease, rule out Barrett's esophagus, rule out esophagitis and rule out stricture.  We discussed if stricture is found that we may perform dilation.  We discussed the risk, benefits and alternatives to upper endoscopy and she is agreeable and wishes to proceed  2.  History of adenoma of the colon --surveillance colonoscopy recommended at this time.  We discussed the risk, benefits and alternatives and she is agreeable and wishes to proceed    JG:OTLXBW, Standley Brooking, Md [address] [place],  [place] [id number]  Briscoe Deutscher, DO [facility]

## 2020-01-20 NOTE — Patient Instructions (Signed)
If you are age 57 or older, your body mass index should be between 23-30. Your Body mass index is 36.49 kg/m. If this is out of the aforementioned range listed, please consider follow up with your Primary Care Provider.  If you are age 79 or younger, your body mass index should be between 19-25. Your Body mass index is 36.49 kg/m. If this is out of the aformentioned range listed, please consider follow up with your Primary Care Provider.   START: Pepcid 20mg  - Take twice daily as needed for acid reflux.   We have sent the following medications to your pharmacy for you to pick up at your convenience: [place] have been scheduled for an endoscopy and colonoscopy. Please follow the written instructions given to you at your visit today. Please pick up your prep supplies at the pharmacy within the next 1-3 days. If you use inhalers (even only as needed), please bring them with you on the day of your procedure.   Due to recent changes in healthcare laws, you may see the results of your imaging and laboratory studies on MyChart before your provider has had a chance to review them.  We understand that in some cases there may be results that are confusing or concerning to you. Not all laboratory results come back in the same time frame and the provider may be waiting for multiple results in order to interpret others.  Please give [place] 48 hours in order for your provider to thoroughly review all the results before contacting the office for clarification of your results.   Thank you for choosing me and [facility].  Dr. Ulice Dash Pyrtle

## 2020-03-06 ENCOUNTER — Encounter: Payer: Self-pay | Admitting: Internal Medicine

## 2020-03-11 ENCOUNTER — Ambulatory Visit (AMBULATORY_SURGERY_CENTER): Payer: BC Managed Care – PPO | Admitting: Internal Medicine

## 2020-03-11 ENCOUNTER — Encounter: Payer: Self-pay | Admitting: Internal Medicine

## 2020-03-11 ENCOUNTER — Other Ambulatory Visit: Payer: Self-pay

## 2020-03-11 VITALS — BP 140/90 | HR 67 | Temp 98.4°F | Resp 17 | Ht 68.0 in | Wt 240.0 lb

## 2020-03-11 DIAGNOSIS — R1310 Dysphagia, unspecified: Secondary | ICD-10-CM | POA: Diagnosis not present

## 2020-03-11 DIAGNOSIS — D125 Benign neoplasm of sigmoid colon: Secondary | ICD-10-CM

## 2020-03-11 DIAGNOSIS — K6389 Other specified diseases of intestine: Secondary | ICD-10-CM

## 2020-03-11 DIAGNOSIS — Z86010 Personal history of colonic polyps: Secondary | ICD-10-CM

## 2020-03-11 DIAGNOSIS — K219 Gastro-esophageal reflux disease without esophagitis: Secondary | ICD-10-CM | POA: Diagnosis not present

## 2020-03-11 MED ORDER — SODIUM CHLORIDE 0.9 % IV SOLN: 500.0000 mL | INTRAVENOUS | Status: DC

## 2020-03-11 NOTE — Progress Notes (Signed)
VS- Briana Ferguson 

## 2020-03-11 NOTE — Op Note (Signed)
[facility] Patient Name: Briana Ferguson Procedure Date: 03/11/2020 3:01 PM MRN: [id number] Endoscopist: Jerene Bears , MD Age: 59 Referring MD:  Date of Birth: 06-01-1960 Gender: Female Account #: [id number] Procedure:                Colonoscopy Indications:              High risk colon cancer surveillance: Personal                            history of non-advanced adenoma, Last colonoscopy:                            October 2016 Medicines:                Monitored Anesthesia Care Procedure:                Pre-Anesthesia Assessment:                           - Prior to the procedure, a History and Physical                            was performed, and patient medications and                            allergies were reviewed. The patient's tolerance of                            previous anesthesia was also reviewed. The risks                            and benefits of the procedure and the sedation                            options and risks were discussed with the patient.                            All questions were answered, and informed consent                            was obtained. Prior Anticoagulants: The patient has                            taken no previous anticoagulant or antiplatelet                            agents. ASA Grade Assessment: II - A patient with                            mild systemic disease. After reviewing the risks                            and benefits, the patient was deemed in  satisfactory condition to undergo the procedure.                           After obtaining informed consent, the colonoscope                            was passed under direct vision. Throughout the                            procedure, the patient's blood pressure, pulse, and                            oxygen saturations were monitored continuously. The                            Colonoscope was introduced through the anus and                             advanced to the cecum, identified by appendiceal                            orifice and ileocecal valve. The colonoscopy was                            performed without difficulty. The patient tolerated                            the procedure well. The quality of the bowel                            preparation was good. The ileocecal valve,                            appendiceal orifice, and rectum were photographed. Scope In: 3:19:30 PM Scope Out: 3:32:58 PM Scope Withdrawal Time: 0 hours 11 minutes 29 seconds  Total Procedure Duration: 0 hours 13 minutes 28 seconds  Findings:                 The digital rectal exam was normal.                           A 5 mm polyp was found in the sigmoid colon. The                            polyp was sessile. The polyp was removed with a                            cold snare. Resection and retrieval were complete.                           The exam was otherwise without abnormality on                            direct and retroflexion views. Complications:  No immediate complications. Estimated Blood Loss:     Estimated blood loss was minimal. Impression:               - One 5 mm polyp in the sigmoid colon, removed with                            a cold snare. Resected and retrieved.                           - The examination was otherwise normal on direct                            and retroflexion views. Recommendation:           - Patient has a contact number available for                            emergencies. The signs and symptoms of potential                            delayed complications were discussed with the                            patient. Return to normal activities tomorrow.                            Written discharge instructions were provided to the                            patient.                           - Resume previous diet.                           - Continue present  medications.                           - Await pathology results.                           - Repeat colonoscopy date to be determined after                            pending pathology results are reviewed for                            surveillance. Jerene Bears, MD 03/11/2020 3:38:54 PM This report has been signed electronically.

## 2020-03-11 NOTE — Progress Notes (Signed)
PT taken to PACU. Monitors in place. VSS. Report given to RN. 

## 2020-03-11 NOTE — Patient Instructions (Signed)
Handout provided on polyps and GERD.  Famotidine 20mg  twice daily can be used as needed for heartburn, indigestion and GERD symptoms.   YOU HAD AN ENDOSCOPIC PROCEDURE TODAY AT [facility]:   Refer to the procedure report that was given to you for any specific questions about what was found during the examination.  If the procedure report does not answer your questions, please call your gastroenterologist to clarify.  If you requested that your care partner not be given the details of your procedure findings, then the procedure report has been included in a sealed envelope for you to review at your convenience later.  YOU SHOULD EXPECT: Some feelings of bloating in the abdomen. Passage of more gas than usual.  Walking can help get rid of the air that was put into your GI tract during the procedure and reduce the bloating. If you had a lower endoscopy (such as a colonoscopy or flexible sigmoidoscopy) you may notice spotting of blood in your stool or on the toilet paper. If you underwent a bowel prep for your procedure, you may not have a normal bowel movement for a few days.  Please Note:  You might notice some irritation and congestion in your nose or some drainage.  This is from the oxygen used during your procedure.  There is no need for concern and it should clear up in a day or so.  SYMPTOMS TO REPORT IMMEDIATELY:   Following lower endoscopy (colonoscopy or flexible sigmoidoscopy):  Excessive amounts of blood in the stool  Significant tenderness or worsening of abdominal pains  Swelling of the abdomen that is new, acute  Fever of 100F or higher   Following upper endoscopy (EGD)  Vomiting of blood or coffee ground material  New chest pain or pain under the shoulder blades  Painful or persistently difficult swallowing  New shortness of breath  Fever of 100F or higher  Black, tarry-looking stools  For urgent or emergent issues, a gastroenterologist can be reached at  any hour by calling [phone]. Do not use MyChart messaging for urgent concerns.    DIET:  We do recommend a small meal at first, but then you may proceed to your regular diet.  Drink plenty of fluids but you should avoid alcoholic beverages for 24 hours.  ACTIVITY:  You should plan to take it easy for the rest of today and you should NOT DRIVE or use heavy machinery until tomorrow (because of the sedation medicines used during the test).    FOLLOW UP: Our staff will call the number listed on your records 48-72 hours following your procedure to check on you and address any questions or concerns that you may have regarding the information given to you following your procedure. If we do not reach you, we will leave a message.  We will attempt to reach you two times.  During this call, we will ask if you have developed any symptoms of COVID 19. If you develop any symptoms (ie: fever, flu-like symptoms, shortness of breath, cough etc.) before then, please call [phone].  If you test positive for Covid 19 in the 2 weeks post procedure, please call and report this information to [place].    If any biopsies were taken you will be contacted by phone or by letter within the next 1-3 weeks.  Please call us at [phone] if you have not heard about the biopsies in 3 weeks.    SIGNATURES/CONFIDENTIALITY: You and/or your care partner have  signed paperwork which will be entered into your electronic medical record.  These signatures attest to the fact that that the information above on your After Visit Summary has been reviewed and is understood.  Full responsibility of the confidentiality of this discharge information lies with you and/or your care-partner.

## 2020-03-11 NOTE — Progress Notes (Signed)
Called to room to assist during endoscopic procedure.  Patient ID and intended procedure confirmed with present staff. Received instructions for my participation in the procedure from the performing physician.  

## 2020-03-11 NOTE — Op Note (Signed)
[facility] Patient Name: Briana Ferguson Procedure Date: 03/11/2020 3:02 PM MRN: [id number] Endoscopist: Jerene Bears , MD Age: 59 Referring MD:  Date of Birth: Sep 14, 1960 Gender: Female Account #: [id number] Procedure:                Upper GI endoscopy Indications:              Gastro-esophageal reflux disease with dysphagia                            symptom (improved with famotidine) Medicines:                Monitored Anesthesia Care Procedure:                Pre-Anesthesia Assessment:                           - Prior to the procedure, a History and Physical                            was performed, and patient medications and                            allergies were reviewed. The patient's tolerance of                            previous anesthesia was also reviewed. The risks                            and benefits of the procedure and the sedation                            options and risks were discussed with the patient.                            All questions were answered, and informed consent                            was obtained. Prior Anticoagulants: The patient has                            taken no previous anticoagulant or antiplatelet                            agents. ASA Grade Assessment: II - A patient with                            mild systemic disease. After reviewing the risks                            and benefits, the patient was deemed in                            satisfactory condition to undergo the procedure.  After obtaining informed consent, the endoscope was                            passed under direct vision. Throughout the                            procedure, the patient's blood pressure, pulse, and                            oxygen saturations were monitored continuously. The                            Endoscope was introduced through the mouth, and                            advanced to the second  part of duodenum. The upper                            GI endoscopy was accomplished without difficulty.                            The patient tolerated the procedure well. Scope In: Scope Out: Findings:                 Mild mucosal changes characterized by congestion,                            granularity and longitudinal markings were found in                            the lower third of the esophagus. This is likely                            related to acid reflux disease. Biopsies were taken                            with a cold forceps for histology.                           The exam of the esophagus was otherwise normal.                            Z-line is regular. No strictures or rings seen.                           The entire examined stomach was normal.                           The examined duodenum was normal. Complications:            No immediate complications. Estimated Blood Loss:     Estimated blood loss was minimal. Impression:               - Congested, granular, longitudinally marked mucosa  in the esophagus (likely secondary to acid reflux).                            Biopsied.                           - Normal stomach.                           - Normal examined duodenum. Recommendation:           - Patient has a contact number available for                            emergencies. The signs and symptoms of potential                            delayed complications were discussed with the                            patient. Return to normal activities tomorrow.                            Written discharge instructions were provided to the                            patient.                           - Resume previous diet.                           - Continue present medications.                           - Await pathology results.                           - Famotidine 20 mg twice daily can be used as                             needed for heartburn, indigestion and GERD symptoms. Jerene Bears, MD 03/11/2020 3:37:26 PM This report has been signed electronically.

## 2020-03-13 ENCOUNTER — Telehealth: Payer: Self-pay

## 2020-03-13 NOTE — Telephone Encounter (Signed)
°  Follow up Call-  Call back number 03/11/2020  Post procedure Call Back phone  # [phone]  Permission to leave phone message Yes  Some recent data might be hidden     Patient questions:  Do you have a fever, pain , or abdominal swelling? No. Pain Score  0 *  Have you tolerated food without any problems? Yes.    Have you been able to return to your normal activities? Yes.    Do you have any questions about your discharge instructions: Diet   No. Medications  No. Follow up visit  No.  Do you have questions or concerns about your Care? No.  Actions: * If pain score is 4 or above: No action needed, pain <4.  1. Have you developed a fever since your procedure? no  2.   Have you had an respiratory symptoms (SOB or cough) since your procedure? no  3.   Have you tested positive for COVID 19 since your procedure no  4.   Have you had any family members/close contacts diagnosed with the COVID 19 since your procedure?  no   If yes to any of these questions please route to Joylene John, RN and Joella Prince, RN

## 2020-03-24 ENCOUNTER — Encounter: Payer: Self-pay | Admitting: Internal Medicine

## 2020-06-16 ENCOUNTER — Encounter (INDEPENDENT_AMBULATORY_CARE_PROVIDER_SITE_OTHER): Payer: Self-pay | Admitting: Family Medicine

## 2020-06-16 ENCOUNTER — Ambulatory Visit (INDEPENDENT_AMBULATORY_CARE_PROVIDER_SITE_OTHER): Payer: BC Managed Care – PPO | Admitting: Family Medicine

## 2020-06-16 ENCOUNTER — Other Ambulatory Visit: Payer: Self-pay

## 2020-06-16 VITALS — BP 136/86 | HR 80 | Temp 98.5°F | Ht 68.0 in | Wt 242.0 lb

## 2020-06-16 DIAGNOSIS — R7303 Prediabetes: Secondary | ICD-10-CM | POA: Diagnosis not present

## 2020-06-16 DIAGNOSIS — R0602 Shortness of breath: Secondary | ICD-10-CM | POA: Diagnosis not present

## 2020-06-16 DIAGNOSIS — Z1331 Encounter for screening for depression: Secondary | ICD-10-CM | POA: Diagnosis not present

## 2020-06-16 DIAGNOSIS — Z6836 Body mass index (BMI) 36.0-36.9, adult: Secondary | ICD-10-CM

## 2020-06-16 DIAGNOSIS — E538 Deficiency of other specified B group vitamins: Secondary | ICD-10-CM

## 2020-06-16 DIAGNOSIS — E559 Vitamin D deficiency, unspecified: Secondary | ICD-10-CM | POA: Diagnosis not present

## 2020-06-16 DIAGNOSIS — Z9189 Other specified personal risk factors, not elsewhere classified: Secondary | ICD-10-CM | POA: Diagnosis not present

## 2020-06-16 DIAGNOSIS — R5383 Other fatigue: Secondary | ICD-10-CM

## 2020-06-16 DIAGNOSIS — Z0289 Encounter for other administrative examinations: Secondary | ICD-10-CM

## 2020-06-17 LAB — COMPREHENSIVE METABOLIC PANEL
ALT: 12 IU/L (ref 0–32)
AST: 17 IU/L (ref 0–40)
Albumin/Globulin Ratio: 1.1 — ABNORMAL LOW (ref 1.2–2.2)
Albumin: 3.9 g/dL (ref 3.8–4.9)
Alkaline Phosphatase: 87 IU/L (ref 44–121)
BUN/Creatinine Ratio: 11 (ref 9–23)
BUN: 13 mg/dL (ref 6–24)
Bilirubin Total: 0.4 mg/dL (ref 0.0–1.2)
CO2: 26 mmol/L (ref 20–29)
Calcium: 9.3 mg/dL (ref 8.7–10.2)
Chloride: 102 mmol/L (ref 96–106)
Creatinine, Ser: 1.14 mg/dL — ABNORMAL HIGH (ref 0.57–1.00)
GFR calc Af Amer: 62 mL/min/{1.73_m2} (ref >59)
GFR calc non Af Amer: 54 mL/min/{1.73_m2} — ABNORMAL LOW (ref >59)
Globulin, Total: 3.6 g/dL (ref 1.5–4.5)
Glucose: 99 mg/dL (ref 65–99)
Potassium: 4.3 mmol/L (ref 3.5–5.2)
Sodium: 141 mmol/L (ref 134–144)
Total Protein: 7.5 g/dL (ref 6.0–8.5)

## 2020-06-17 LAB — CBC WITH DIFFERENTIAL/PLATELET
Basophils Absolute: 0 10*3/uL (ref 0.0–0.2)
Basos: 1 %
EOS (ABSOLUTE): 0 10*3/uL (ref 0.0–0.4)
Eos: 1 %
Hematocrit: 40.1 % (ref 34.0–46.6)
Hemoglobin: 13.1 g/dL (ref 11.1–15.9)
Immature Grans (Abs): 0 10*3/uL (ref 0.0–0.1)
Immature Granulocytes: 0 %
Lymphocytes Absolute: 1.7 10*3/uL (ref 0.7–3.1)
Lymphs: 38 %
MCH: 28.4 pg (ref 26.6–33.0)
MCHC: 32.7 g/dL (ref 31.5–35.7)
MCV: 87 fL (ref 79–97)
Monocytes Absolute: 0.4 10*3/uL (ref 0.1–0.9)
Monocytes: 9 %
Neutrophils Absolute: 2.3 10*3/uL (ref 1.4–7.0)
Neutrophils: 51 %
Platelets: 294 10*3/uL (ref 150–450)
RBC: 4.62 x10E6/uL (ref 3.77–5.28)
RDW: 13.1 % (ref 11.7–15.4)
WBC: 4.4 10*3/uL (ref 3.4–10.8)

## 2020-06-17 LAB — LIPID PANEL WITH LDL/HDL RATIO
Cholesterol, Total: 174 mg/dL (ref 100–199)
HDL: 77 mg/dL (ref >39)
LDL Chol Calc (NIH): 85 mg/dL (ref 0–99)
LDL/HDL Ratio: 1.1 ratio (ref 0.0–3.2)
Triglycerides: 62 mg/dL (ref 0–149)
VLDL Cholesterol Cal: 12 mg/dL (ref 5–40)

## 2020-06-17 LAB — HEMOGLOBIN A1C
Est. average glucose Bld gHb Est-mCnc: 126 mg/dL
Hgb A1c MFr Bld: 6 % — ABNORMAL HIGH (ref 4.8–5.6)

## 2020-06-17 LAB — INSULIN, RANDOM: INSULIN: 14.1 u[IU]/mL (ref 2.6–24.9)

## 2020-06-17 LAB — VITAMIN B12: Vitamin B-12: 807 pg/mL (ref 232–1245)

## 2020-06-17 LAB — TSH: TSH: 2.19 u[IU]/mL (ref 0.450–4.500)

## 2020-06-17 LAB — T4: T4, Total: 8.1 ug/dL (ref 4.5–12.0)

## 2020-06-17 LAB — T3: T3, Total: 114 ng/dL (ref 71–180)

## 2020-06-17 LAB — FOLATE: Folate: >20 ng/mL (ref >3.0)

## 2020-06-17 LAB — VITAMIN D 25 HYDROXY (VIT D DEFICIENCY, FRACTURES): Vit D, 25-Hydroxy: 44.4 ng/mL (ref 30.0–100.0)

## 2020-06-18 NOTE — Progress Notes (Signed)
Chief Complaint:   OBESITY Briana Ferguson (MR# [id number]) is a 60 y.o. female who presents for evaluation and treatment of obesity and related comorbidities. Current BMI is Body mass index is 36.8 kg/m. Briana Ferguson has been struggling with her weight for many years and has been unsuccessful in either losing weight, maintaining weight loss, or reaching her healthy weight goal.  Bilan is returning to our clinic after taking time away to care for her sick mother, in addition to her family duties and work.  Briana Ferguson is currently in the action stage of change and ready to dedicate time achieving and maintaining a healthier weight. Briana Ferguson is interested in becoming our patient and working on intensive lifestyle modifications including (but not limited to) diet and exercise for weight loss.  Briana Ferguson's habits were reviewed today and are as follows: Her family eats meals together, she thinks her family will eat healthier with her, her desired weight loss is 62-67 lbs, she has been heavy most of her life, she started gaining weight after her child was born, her heaviest weight ever was 264 pounds, she snacks frequently in the evenings, she skips meals frequently, she frequently makes poor food choices, she frequently eats larger portions than normal and she struggles with emotional eating.  Depression Screen Yola's Food and Mood (modified PHQ-9) score was 16.  Depression screen [facility] 2/9 06/16/2020  Decreased Interest 3  Down, Depressed, Hopeless 1  PHQ - 2 Score 4  Altered sleeping 3  Tired, decreased energy 3  Change in appetite 2  Feeling bad or failure about yourself  1  Trouble concentrating 2  Moving slowly or fidgety/restless 1  Suicidal thoughts 0  PHQ-9 Score 16  Difficult doing work/chores Not difficult at all   Subjective:   1. Other fatigue Reis admits to daytime somnolence and admits to waking up still tired. Patent has a history of symptoms of daytime fatigue. Briana Ferguson generally gets 5  or 6 hours of sleep per night, and states that she has generally restful sleep with CPAP. Snoring is present. Apneic episodes are present. Epworth Sleepiness Score is 15.  2. SOB (shortness of breath) on exertion Ellison notes increasing shortness of breath with exercising and seems to be worsening over time with weight gain. She notes getting out of breath sooner with activity than she used to. This has not gotten worse recently. Briana Ferguson denies shortness of breath at rest or orthopnea.  3. Pre-diabetes Nikitta is ready to work on diet and exercise, and she is doing for labs.  4. Vitamin D deficiency Briana Ferguson is on Vit D, and she is due to have labs checked. She notes fatigue.  5. Vitamin B12 deficiency Briana Ferguson is on B12, and she is due for labs. She notes fatigue.  6. At risk for diabetes mellitus Briana Ferguson is at higher than average risk for developing diabetes due to obesity.   Assessment/Plan:   1. Other fatigue Brandis does feel that her weight is causing her energy to be lower than it should be. Fatigue may be related to obesity, depression or many other causes. Labs will be ordered, and in the meanwhile, Briana Ferguson will focus on self care including making healthy food choices, increasing physical activity and focusing on stress reduction.  - CBC with Differential/Platelet - EKG 12-Lead - Folate - Lipid Panel With LDL/HDL Ratio - T3 - T4 - TSH  2. SOB (shortness of breath) on exertion Briana Ferguson does feel that she gets out of breath more easily  that she used to when she exercises. Arta's shortness of breath appears to be obesity related and exercise induced. She has agreed to work on weight loss and gradually increase exercise to treat her exercise induced shortness of breath. Will continue to monitor closely.  3. Pre-diabetes Shereka will start her Category 2 plan, and will continue to work on weight loss, exercise, and decreasing simple carbohydrates to help decrease the risk of diabetes. We will check  labs today.  - Comprehensive metabolic panel - Hemoglobin A1c - Insulin, random  4. Vitamin D deficiency Low Vitamin D level contributes to fatigue and are associated with obesity, breast, and colon cancer. We will check labs today. Karinna will follow-up for routine testing of Vitamin D, at least 2-3 times per year to avoid over-replacement.  - VITAMIN D 25 Hydroxy (Vit-D Deficiency, Fractures)  5. Vitamin B12 deficiency The diagnosis was reviewed with the patient. Briana Ferguson will start her B12 rich eating plan. We will continue to monitor. Orders and follow up as documented in patient record.  - Vitamin B12  6. Screening for depression Briana Ferguson had a positive depression screening. Depression is commonly associated with obesity and often results in emotional eating behaviors. We will monitor this closely and work on CBT to help improve the non-hunger eating patterns. Referral to Psychology may be required if no improvement is seen as she continues in our clinic.  7. At risk for diabetes mellitus Briana Ferguson was given approximately 30 minutes of diabetes education and counseling today. We discussed intensive lifestyle modifications today with an emphasis on weight loss as well as increasing exercise and decreasing simple carbohydrates in her diet. We also reviewed medication options with an emphasis on risk versus benefit of those discussed.   Repetitive spaced learning was employed today to elicit superior memory formation and behavioral change.  8. Class 2 severe obesity with serious comorbidity and body mass index (BMI) of 36.0 to 36.9 in adult, unspecified obesity type (HCC) Briana Ferguson is currently in the action stage of change and her goal is to continue with weight loss efforts. I recommend Briana Ferguson begin the structured treatment plan as follows:  She has agreed to the Category 2 Plan.  Exercise goals: No exercise has been prescribed for now, while we concentrate on nutritional changes.   Behavioral  modification strategies: increasing lean protein intake.  She was informed of the importance of frequent follow-up visits to maximize her success with intensive lifestyle modifications for her multiple health conditions. She was informed we would discuss her lab results at her next visit unless there is a critical issue that needs to be addressed sooner. Briana Ferguson agreed to keep her next visit at the agreed upon time to discuss these results.  Objective:   Blood pressure 136/86, pulse 80, temperature 98.5 F (36.9 C), height 5\' 8"  (1.727 m), weight 242 lb (109.8 kg), last menstrual period 04/03/2011, SpO2 99 %. Body mass index is 36.8 kg/m.  EKG: Normal sinus rhythm, rate 69 BPM.  Indirect Calorimeter completed today shows a VO2 of 227 and a REE of 1581.  Her calculated basal metabolic rate is 5053 thus her basal metabolic rate is worse than expected.  General: Cooperative, alert, well developed, in no acute distress. HEENT: Conjunctivae and lids unremarkable. Cardiovascular: Regular rhythm.  Lungs: Normal work of breathing. Neurologic: No focal deficits.   Lab Results  Component Value Date   CREATININE 1.14 (H) 06/16/2020   BUN 13 06/16/2020   NA 141 06/16/2020   K 4.3 06/16/2020  CL 102 06/16/2020   CO2 26 06/16/2020   Lab Results  Component Value Date   ALT 12 06/16/2020   AST 17 06/16/2020   ALKPHOS 87 06/16/2020   BILITOT 0.4 06/16/2020   Lab Results  Component Value Date   HGBA1C 6.0 (H) 06/16/2020   HGBA1C 5.9 (H) 08/27/2019   HGBA1C 6.2 (H) 05/07/2019   HGBA1C 6.5 03/19/2019   Lab Results  Component Value Date   INSULIN 14.1 06/16/2020   INSULIN 18.9 05/07/2019   Lab Results  Component Value Date   TSH 2.190 06/16/2020   Lab Results  Component Value Date   CHOL 174 06/16/2020   HDL 77 06/16/2020   LDLCALC 85 06/16/2020   TRIG 62 06/16/2020   CHOLHDL 3 03/19/2019   Lab Results  Component Value Date   WBC 4.4 06/16/2020   HGB 13.1 06/16/2020   HCT  40.1 06/16/2020   MCV 87 06/16/2020   PLT 294 06/16/2020   No results found for: IRON, TIBC, FERRITIN  Attestation Statements:   Reviewed by clinician on day of visit: allergies, medications, problem list, medical history, surgical history, family history, social history, and previous encounter notes.   I, Trixie Dredge, am acting as transcriptionist for Dennard Nip, MD.  I have reviewed the above documentation for accuracy and completeness, and I agree with the above. - Dennard Nip, MD

## 2020-06-30 ENCOUNTER — Ambulatory Visit (INDEPENDENT_AMBULATORY_CARE_PROVIDER_SITE_OTHER): Payer: BC Managed Care – PPO | Admitting: Family Medicine

## 2020-06-30 ENCOUNTER — Other Ambulatory Visit: Payer: Self-pay

## 2020-06-30 ENCOUNTER — Encounter (INDEPENDENT_AMBULATORY_CARE_PROVIDER_SITE_OTHER): Payer: Self-pay | Admitting: Family Medicine

## 2020-06-30 VITALS — BP 132/75 | HR 64 | Temp 98.4°F | Ht 68.0 in | Wt 245.0 lb

## 2020-06-30 DIAGNOSIS — F3289 Other specified depressive episodes: Secondary | ICD-10-CM

## 2020-06-30 DIAGNOSIS — Z9189 Other specified personal risk factors, not elsewhere classified: Secondary | ICD-10-CM

## 2020-06-30 DIAGNOSIS — E559 Vitamin D deficiency, unspecified: Secondary | ICD-10-CM

## 2020-06-30 DIAGNOSIS — Z6837 Body mass index (BMI) 37.0-37.9, adult: Secondary | ICD-10-CM

## 2020-06-30 DIAGNOSIS — E1169 Type 2 diabetes mellitus with other specified complication: Secondary | ICD-10-CM

## 2020-06-30 DIAGNOSIS — M79601 Pain in right arm: Secondary | ICD-10-CM | POA: Diagnosis not present

## 2020-06-30 DIAGNOSIS — R7989 Other specified abnormal findings of blood chemistry: Secondary | ICD-10-CM | POA: Diagnosis not present

## 2020-06-30 MED ORDER — VITAMIN D (ERGOCALCIFEROL) 1.25 MG (50000 UNIT) PO CAPS: 50000.0000 [IU] | ORAL_CAPSULE | ORAL | 0 refills | Status: DC

## 2020-06-30 MED ORDER — BUPROPION HCL ER (SR) 200 MG PO TB12: 200.0000 mg | ORAL_TABLET | Freq: Every morning | ORAL | 0 refills | Status: DC

## 2020-07-01 NOTE — Progress Notes (Signed)
Chief Complaint:   OBESITY Briana Briana Ferguson is here to discuss her progress with her obesity treatment plan along with follow-up of her obesity related diagnoses. Briana Briana Ferguson is on the Category 2 Plan and states she is following her eating plan approximately 50% of the time. Briana Briana Ferguson states she is doing 0 minutes 0 times per week.  Today's visit was #: 2 Starting weight: 242 lbs Starting date: 06/16/2020 Today's weight: 245 lbs Today's date: 06/30/2020 Total lbs lost to date: 0 Total lbs lost since last in-office visit: 0  Interim History: Briana Briana Ferguson struggled to get back on track with her plan. She is doing better now and she is ready to follow her plan closely.  Subjective:   1. Vitamin D deficiency Briana Briana Ferguson's Vit D level is not yet at goal. She is not on Vit D any longer. I discussed labs with the patient today.  2. Elevated serum creatinine Briana Briana Ferguson's creatinine is slightly elevated, worsening and her GFR is also slight elevated. She hasn't been drinking enough water. I discussed labs with the patient today.  3. Right arm pain Briana Briana Ferguson notes some right arm tingling and pain, worse recently. This is a new diagnosis. She has a history of right wrist carpal tunnel.   4. Type 2 diabetes mellitus with other specified complication, without long-term current use of insulin (HCC) Briana Briana Ferguson continues to work on diet and exercise. Her A1c is elevated at 6.0, her highest A1c was 6.5 a couple of years ago. She is not on medications. I discussed labs with the patient today.  5. Other depression with emotional eating Briana Briana Ferguson notes increased emotional eating behaviors, especially when she is tired and especially between dinner and bedtime.  6. At risk for heart disease Briana Briana Ferguson is at a higher than average risk for cardiovascular disease due to obesity.   Assessment/Plan:   1. Vitamin D deficiency Low Vitamin D level contributes to fatigue and are associated with obesity, breast, and colon cancer. Briana Briana Ferguson agreed to start  prescription Vitamin D 50,000 IU every week with no refills. She will follow-up for routine testing of Vitamin D, at least 2-3 times per year to avoid over-replacement.  - Vitamin D, Ergocalciferol, (DRISDOL) 1.25 MG (50000 UNIT) CAPS capsule; Take 1 capsule (50,000 Units total) by mouth every 7 (seven) days.  Dispense: 4 capsule; Refill: 0  2. Elevated serum creatinine Briana Briana Ferguson will increase her water intake until her urine is almost clear, and we will recheck labs in 3 months.  3. Right arm pain We will refer Briana Briana Ferguson to Dr. Lynne Briana Ferguson, Sports medicine for evaluation.  - Ambulatory referral to Sports Medicine  4. Type 2 diabetes mellitus with other specified complication, without long-term current use of insulin (HCC) Briana Briana Ferguson, Briana Briana Ferguson, Briana Briana Ferguson, Briana Briana Ferguson, Briana Briana Ferguson disease, and death. Intensive lifestyle modification including diet, exercise and weight Briana Ferguson are the first line of treatment for diabetes. Briana Briana Ferguson will continue diet and exercise, and we will recheck labs in 3 months.  5. Other depression with emotional eating Behavior modification techniques were discussed today to help Briana Ferguson deal with her emotional/non-hunger eating behaviors. Briana Briana Ferguson agreed to start Wellbutrin SR 200 mg q AM with no refills. Orders and follow up as documented in patient record.   - buPROPion (WELLBUTRIN SR) 200 MG 12 hr tablet; Take 1 tablet (200 mg total) by mouth in the morning.  Dispense: 30 tablet; Refill: 0  6. At risk for heart disease  Briana Briana Ferguson was given approximately 30 minutes of Briana Briana Ferguson disease prevention counseling today. She is 60 y.o. female and has risk factors for heart disease including obesity. We discussed intensive lifestyle modifications today with an emphasis on specific weight Briana Ferguson instructions and strategies.   Repetitive spaced learning was employed today to elicit superior memory  formation and behavioral change.  7. Class 2 severe obesity with serious comorbidity and body mass index (BMI) of 37.0 to 37.9 in adult, unspecified obesity type (HCC) Briana Briana Ferguson is currently in the action stage of change. As such, her goal is to continue with weight Briana Ferguson efforts. She has agreed to the Category 2 Plan.   Behavioral modification strategies: increasing lean protein intake and meal planning and cooking strategies.  Briana Briana Ferguson has agreed to follow-up with our clinic in 2 weeks. She was informed of the importance of frequent follow-up visits to maximize her success with intensive lifestyle modifications for her multiple health conditions.   Objective:   Blood pressure 132/75, pulse 64, temperature 98.4 F (36.9 C), height 5\' 8"  (1.727 m), weight 245 lb (111.1 kg), last menstrual period 04/03/2011, SpO2 99 %. Body mass index is 37.25 kg/m.  General: Cooperative, alert, well developed, in no acute distress. HEENT: Conjunctivae and lids unremarkable. Cardiovascular: Regular rhythm.  Lungs: Normal work of breathing. Neurologic: No focal deficits.   Lab Results  Component Value Date   CREATININE 1.14 (H) 06/16/2020   BUN 13 06/16/2020   NA 141 06/16/2020   K 4.3 06/16/2020   CL 102 06/16/2020   CO2 26 06/16/2020   Lab Results  Component Value Date   ALT 12 06/16/2020   AST 17 06/16/2020   ALKPHOS 87 06/16/2020   BILITOT 0.4 06/16/2020   Lab Results  Component Value Date   HGBA1C 6.0 (H) 06/16/2020   HGBA1C 5.9 (H) 08/27/2019   HGBA1C 6.2 (H) 05/07/2019   HGBA1C 6.5 03/19/2019   Lab Results  Component Value Date   INSULIN 14.1 06/16/2020   INSULIN 18.9 05/07/2019   Lab Results  Component Value Date   TSH 2.190 06/16/2020   Lab Results  Component Value Date   CHOL 174 06/16/2020   HDL 77 06/16/2020   LDLCALC 85 06/16/2020   TRIG 62 06/16/2020   CHOLHDL 3 03/19/2019   Lab Results  Component Value Date   WBC 4.4 06/16/2020   HGB 13.1 06/16/2020   HCT 40.1  06/16/2020   MCV 87 06/16/2020   PLT 294 06/16/2020   No results found for: IRON, TIBC, FERRITIN  Attestation Statements:   Reviewed by clinician on day of visit: allergies, medications, problem list, medical history, surgical history, family history, social history, and previous encounter notes.   I, Trixie Dredge, am acting as transcriptionist for Dennard Nip, MD.  I have reviewed the above documentation for accuracy and completeness, and I agree with the above. -  Dennard Nip, MD

## 2020-07-06 ENCOUNTER — Encounter: Payer: Self-pay | Admitting: Family Medicine

## 2020-07-09 NOTE — Progress Notes (Signed)
Subjective:    I'm seeing this patient as a consultation for:  Dr. Regis Bill and Dr. Leafy Ro. Note will be routed back to referring provider/PCP.  CC: R arm pain  I, Molly Weber, LAT, ATC, am serving as scribe for Dr. Lynne Leader.  HPI: Pt is a 60 y/o female presenting w/ R arm pain and tingling that has been worsening recently.  Pt notes a few weeks ago she experienced a "spasm" in right arm that lasted for about 20 minutes. She locates her pain to R-side neck, upper back/trap that radiates into arm and hand. She has a hx of bilat carpal tunnel. Pt notes decreased sensitivity in finger tips digits 2 and 3.  She notes this is a little different from prior carpal tunnel surgery.  She has been recommended to use wrist braces but finds them to be uncomfortable.  Radiating pain: yes R UE numbness/tingling: yes R UE weakness: yes- decreased grip Aggravating factors: fine motor skills, Treatments tried: applying pressure to upper back, chiro  Past medical history, Surgical history, Family history, Social history, Allergies, and medications have been entered into the medical record, reviewed.   Review of Systems: No new headache, visual changes, nausea, vomiting, diarrhea, constipation, dizziness, abdominal pain, skin rash, fevers, chills, night sweats, weight loss, swollen lymph nodes, body aches, joint swelling, muscle aches, chest pain, shortness of breath, mood changes, visual or auditory hallucinations.   Objective:    Vitals:   07/10/20 1034  BP: 130/78  Pulse: 69  SpO2: 99%   General: Well Developed, well nourished, and in no acute distress.  Neuro/Psych: Alert and oriented x3, extra-ocular muscles intact, able to move all 4 extremities, sensation grossly intact. Skin: Warm and dry, no rashes noted.  Respiratory: Not using accessory muscles, speaking in full sentences, trachea midline.  Cardiovascular: Pulses palpable, no extremity edema. Abdomen: Does not appear distended. MSK:  C-spine normal-appearing Nontender midline. Tender palpation right cervical paraspinal musculature and trapezius. Upper extremity strength is intact. Reflexes are equal and normal throughout. Positive right-sided Spurling's test.  Right arm normal-appearing normal motion. Negative Tinel's test right carpal tunnel.  Lab and Radiology Results  X-ray images cervical spine obtained today personally and independently interpreted Loss of cervical lordosis.  No fractures.  Minimal degenerative changes. Await formal radiology review  Impression and Recommendations:    Assessment and Plan: 60 y.o. female with right cervical radiculopathy at C7 dermatomal pattern.  This overlaps with prior carpal tunnel syndrome with a bit of a confounding pattern.  However symptoms are more clearly C7 then carpal tunnel at this time. Fortunately she is out of crisis with only mild symptoms at this time.  Plan for physical therapy and gabapentin intermittently at bedtime.  Have prescribed prednisone as a backup plan in case she has worsening symptoms.  Recheck back in 6 weeks.  If worsening or if not improving could consider MRI as next potential step.Marland Kitchen  PDMP not reviewed this encounter. Orders Placed This Encounter  Procedures  . DG Cervical Spine 2 or 3 views    Standing Status:   Future    Number of Occurrences:   1    Standing Expiration Date:   07/10/2021    Order Specific Question:   Reason for Exam (SYMPTOM  OR DIAGNOSIS REQUIRED)    Answer:   eval rt C7 radiculopathy    Order Specific Question:   Is patient pregnant?    Answer:   No    Order Specific Question:   Preferred  imaging location?    Answer:   [facility]  . Ambulatory referral to Physical Therapy    Referral Priority:   Routine    Referral Type:   Physical Medicine    Referral Reason:   Specialty Services Required    Requested Specialty:   Physical Therapy   Meds ordered this encounter  Medications  . predniSONE (DELTASONE)  50 MG tablet    Sig: Take 1 tablet (50 mg total) by mouth daily.    Dispense:  5 tablet    Refill:  0  . gabapentin (NEURONTIN) 300 MG capsule    Sig: Take 1 capsule (300 mg total) by mouth 3 (three) times daily as needed.    Dispense:  90 capsule    Refill:  3    Discussed warning signs or symptoms. Please see discharge instructions. Patient expresses understanding.   The above documentation has been reviewed and is accurate and complete Lynne Leader, M.D.

## 2020-07-10 ENCOUNTER — Ambulatory Visit: Payer: BC Managed Care – PPO | Admitting: Family Medicine

## 2020-07-10 ENCOUNTER — Other Ambulatory Visit: Payer: Self-pay

## 2020-07-10 ENCOUNTER — Ambulatory Visit (INDEPENDENT_AMBULATORY_CARE_PROVIDER_SITE_OTHER): Payer: BC Managed Care – PPO

## 2020-07-10 ENCOUNTER — Encounter: Payer: Self-pay | Admitting: Family Medicine

## 2020-07-10 VITALS — BP 130/78 | HR 69 | Ht 68.0 in | Wt 250.8 lb

## 2020-07-10 DIAGNOSIS — M5412 Radiculopathy, cervical region: Secondary | ICD-10-CM

## 2020-07-10 DIAGNOSIS — G5601 Carpal tunnel syndrome, right upper limb: Secondary | ICD-10-CM

## 2020-07-10 MED ORDER — PREDNISONE 50 MG PO TABS: 50.0000 mg | ORAL_TABLET | Freq: Every day | ORAL | 0 refills | Status: DC

## 2020-07-10 MED ORDER — GABAPENTIN 300 MG PO CAPS: 300.0000 mg | ORAL_CAPSULE | Freq: Three times a day (TID) | ORAL | 3 refills | Status: DC | PRN

## 2020-07-10 NOTE — Patient Instructions (Addendum)
Thank you for coming in today.  Please get an Xray today before you leave  I've referred you to Physical Therapy.  Let us know if you don't hear from them in one week.  Take the prednisone if you worsen.   Take the gabapentin as needed for nerve pain but most at night as needed.   Recheck in about 6 weeks.   Let me know if this is not working or if you have a problem.    Cervical Radiculopathy  Cervical radiculopathy happens when a nerve in the neck (a cervical nerve) is pinched or bruised. This condition can happen because of an injury to the cervical spine (vertebrae) in the neck, or as part of the normal aging process. Pressure on the cervical nerves can cause pain or numbness that travels from the neck all the way down into the arm and fingers. Usually, this condition gets better with rest. Treatment may be needed if the condition does not improve. What are the causes? This condition may be caused by:  A neck injury.  A bulging (herniated) disk.  Muscle spasms.  Muscle tightness in the neck because of overuse.  Arthritis.  Breakdown or degeneration in the bones and joints of the spine (spondylosis) due to aging.  Bone spurs that may develop near the cervical nerves. What are the signs or symptoms? Symptoms of this condition include:  Pain. The pain may travel from the neck to the arm and hand. The pain can be severe or irritating. It may be worse when you move your neck.  Numbness or tingling in your arm or hand.  Weakness in the affected arm and hand, in severe cases. How is this diagnosed? This condition may be diagnosed based on your symptoms, your medical history, and a physical exam. You may also have tests, including:  X-rays.  A CT scan.  An MRI.  An electromyogram (EMG).  Nerve conduction tests. How is this treated? In many cases, treatment is not needed for this condition. With rest, the condition usually gets better over time. If treatment is  needed, options may include:  Wearing a soft neck collar (cervical collar) for short periods of time, as told by your health care provider.  Doing physical therapy to strengthen your neck muscles.  Taking medicines, such as NSAIDs or oral corticosteroids.  Having spinal injections, in severe cases.  Having surgery. This may be needed if other treatments do not help. Different types of surgery may be done depending on the cause of this condition. Follow these instructions at home: If you have a cervical collar:  Wear it as told by your health care provider. Remove it only as told by your health care provider.  Ask your health care provider if you can remove the collar for cleaning and bathing. If you are allowed to remove the collar for cleaning or bathing: ? Follow instructions from your health care provider about how to remove the collar safely. ? Clean the collar by wiping it with mild soap and water and drying it completely. ? Take out any removable pads in the collar every 1-2 days, and wash them by hand with soap and water. Let them air-dry completely before you put them back in the collar. ? Check your skin under the collar for irritation or sores. If you see any, tell your health care provider. Managing pain  Take over-the-counter and prescription medicines only as told by your health care provider.  If directed, put ice on the  affected area. ? If you have a soft neck collar, remove it as told by your health care provider. ? Put ice in a plastic bag. ? Place a towel between your skin and the bag. ? Leave the ice on for 20 minutes, 2-3 times a day.  If applying ice does not help, you can try using heat. Use the heat source that your health care provider recommends, such as a moist heat pack or a heating pad. ? Place a towel between your skin and the heat source. ? Leave the heat on for 20-30 minutes. ? Remove the heat if your skin turns bright red. This is especially important  if you are unable to feel pain, heat, or cold. You may have a greater risk of getting burned.  Try a gentle neck and shoulder massage to help relieve symptoms.      Activity  Rest as needed.  Return to your normal activities as told by your health care provider. Ask your health care provider what activities are safe for you.  Do stretching and strengthening exercises as told by your health care provider or physical therapist.  Do not lift anything that is heavier than 10 lb (4.5 kg) until your health care provider tells you that it is safe. General instructions  Use a flat pillow when you sleep.  Do not drive while wearing a cervical collar. If you do not have a cervical collar, ask your health care provider if it is safe to drive while your neck heals.  Ask your health care provider if the medicine prescribed to you requires you to avoid driving or using heavy machinery.  Do not use any products that contain nicotine or tobacco, such as cigarettes, e-cigarettes, and chewing tobacco. These can delay healing. If you need help quitting, ask your health care provider.  Keep all follow-up visits as told by your health care provider. This is important. Contact a health care provider if:  Your condition does not improve with treatment. Get help right away if:  Your pain gets much worse and cannot be controlled with medicines.  You have weakness or numbness in your hand, arm, face, or leg.  You have a high fever.  You have a stiff, rigid neck.  You lose control of your bowels or your bladder (have incontinence).  You have trouble with walking, balance, or speaking. Summary  Cervical radiculopathy happens when a nerve in the neck is pinched or bruised.  A nerve can get pinched from a bulging disk, arthritis, muscle spasms, or an injury to the neck.  Symptoms include pain, tingling, or numbness radiating from the neck into the arm or hand. Weakness can also occur in severe  cases.  Treatment may include rest, wearing a cervical collar, and physical therapy. Medicines may be prescribed to help with pain. In severe cases, injections or surgery may be needed. This information is not intended to replace advice given to you by your health care provider. Make sure you discuss any questions you have with your health care provider. Document Revised: 03/02/2018 Document Reviewed: 03/02/2018 Elsevier Patient Education  2021 [employer].

## 2020-07-13 NOTE — Progress Notes (Signed)
X-ray cervical spine shows no fractures or significant malalignment.  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  This is where you have an extra rib of pretty high.  This could interfere with the nerves as it comes out of your neck.  However you are improving.  If not better with physical therapy an MRI would help sort this issue out.

## 2020-07-14 ENCOUNTER — Ambulatory Visit (INDEPENDENT_AMBULATORY_CARE_PROVIDER_SITE_OTHER): Payer: BC Managed Care – PPO | Admitting: Family Medicine

## 2020-07-14 ENCOUNTER — Other Ambulatory Visit: Payer: Self-pay

## 2020-07-14 ENCOUNTER — Encounter (INDEPENDENT_AMBULATORY_CARE_PROVIDER_SITE_OTHER): Payer: Self-pay | Admitting: Family Medicine

## 2020-07-14 VITALS — BP 105/71 | HR 69 | Temp 97.9°F | Ht 68.0 in | Wt 243.0 lb

## 2020-07-14 DIAGNOSIS — G4709 Other insomnia: Secondary | ICD-10-CM

## 2020-07-14 DIAGNOSIS — Z6839 Body mass index (BMI) 39.0-39.9, adult: Secondary | ICD-10-CM | POA: Diagnosis not present

## 2020-07-14 DIAGNOSIS — E66812 Obesity, class 2: Secondary | ICD-10-CM

## 2020-07-14 DIAGNOSIS — F3289 Other specified depressive episodes: Secondary | ICD-10-CM

## 2020-07-16 NOTE — Progress Notes (Signed)
Chief Complaint:   OBESITY Briana Ferguson is here to discuss her progress with her obesity treatment plan along with follow-up of her obesity related diagnoses. Briana Ferguson is on the Category 2 Plan and states she is following her eating plan approximately 65-70% of the time. Briana Ferguson states she is walking (helped her daughter move) for 30 minutes 1 time per week.  Today's visit was #: 3 Starting weight: 242 lbs Starting date: 06/16/2020 Today's weight: 243 lbs Today's date: 07/14/2020 Total lbs lost to date: 0 Total lbs lost since last in-office visit: 2  Interim History: Briana Ferguson continues to do well with weight loss. She helped her daughter move, and she did a lot oc activity. She feels her hunger is better controlled.  Subjective:   1. Other insomnia Briana Ferguson notes increased insomnia with frequent wakening episodes. This may or may not be related to Wellbutrin. She notes some less than ideal sleep behaviors, which she will work on.  2. Other depression with emotional eating Briana Ferguson is on bupropion and she feels her energy is better. She notes more insomnia however.  Assessment/Plan:   1. Other insomnia The problem of recurrent insomnia was discussed. Orders and follow up as documented in patient record. Counseling: Intensive lifestyle modifications are the first line treatment for this issue. We discussed several lifestyle modifications today Briana Ferguson is to decrease screen usage before bed, and sleep in a cold room. We will follow up in 3 weeks to see if this improves her sleep. She will continue to work on diet, exercise and weight loss efforts.   2. Other depression with emotional eating Behavior modification techniques were discussed today to help Briana Ferguson deal with her emotional/non-hunger eating behaviors. Briana Ferguson will continue Wellbutrin, and will work on insomnia. Orders and follow up as documented in patient record.   3. Obesity with current BMI 36.9 Briana Ferguson is currently in the action stage of change.  As such, her goal is to continue with weight loss efforts. She has agreed to the Category 2 Plan.   Exercise goals: As is.  Behavioral modification strategies: meal planning and cooking strategies and travel eating strategies.  Briana Ferguson has agreed to follow-up with our clinic in 3 weeks. She was informed of the importance of frequent follow-up visits to maximize her success with intensive lifestyle modifications for her multiple health conditions.   Objective:   Blood pressure 105/71, pulse 69, temperature 97.9 F (36.6 C), height 5\' 8"  (1.727 m), weight 243 lb (110.2 kg), last menstrual period 04/03/2011, SpO2 98 %. Body mass index is 36.95 kg/m.  General: Cooperative, alert, well developed, in no acute distress. HEENT: Conjunctivae and lids unremarkable. Cardiovascular: Regular rhythm.  Lungs: Normal work of breathing. Neurologic: No focal deficits.   Lab Results  Component Value Date   CREATININE 1.14 (H) 06/16/2020   BUN 13 06/16/2020   NA 141 06/16/2020   K 4.3 06/16/2020   CL 102 06/16/2020   CO2 26 06/16/2020   Lab Results  Component Value Date   ALT 12 06/16/2020   AST 17 06/16/2020   ALKPHOS 87 06/16/2020   BILITOT 0.4 06/16/2020   Lab Results  Component Value Date   HGBA1C 6.0 (H) 06/16/2020   HGBA1C 5.9 (H) 08/27/2019   HGBA1C 6.2 (H) 05/07/2019   HGBA1C 6.5 03/19/2019   Lab Results  Component Value Date   INSULIN 14.1 06/16/2020   INSULIN 18.9 05/07/2019   Lab Results  Component Value Date   TSH 2.190 06/16/2020   Lab Results  Component Value Date   CHOL 174 06/16/2020   HDL 77 06/16/2020   LDLCALC 85 06/16/2020   TRIG 62 06/16/2020   CHOLHDL 3 03/19/2019   Lab Results  Component Value Date   WBC 4.4 06/16/2020   HGB 13.1 06/16/2020   HCT 40.1 06/16/2020   MCV 87 06/16/2020   PLT 294 06/16/2020   No results found for: IRON, TIBC, FERRITIN  Attestation Statements:   Reviewed by clinician on day of visit: allergies, medications, problem  list, medical history, surgical history, family history, social history, and previous encounter notes.  Time spent on visit including pre-visit chart review and post-visit care and charting was 35 minutes.    I, Trixie Dredge, am acting as transcriptionist for Dennard Nip, MD.  I have reviewed the above documentation for accuracy and completeness, and I agree with the above. -  Dennard Nip, MD

## 2020-07-27 ENCOUNTER — Other Ambulatory Visit: Payer: Self-pay

## 2020-07-27 ENCOUNTER — Ambulatory Visit (INDEPENDENT_AMBULATORY_CARE_PROVIDER_SITE_OTHER): Payer: BC Managed Care – PPO | Admitting: Physical Therapy

## 2020-07-27 ENCOUNTER — Encounter: Payer: Self-pay | Admitting: Physical Therapy

## 2020-07-27 DIAGNOSIS — M25512 Pain in left shoulder: Secondary | ICD-10-CM | POA: Diagnosis not present

## 2020-07-27 DIAGNOSIS — M6281 Muscle weakness (generalized): Secondary | ICD-10-CM | POA: Diagnosis not present

## 2020-07-27 DIAGNOSIS — M546 Pain in thoracic spine: Secondary | ICD-10-CM

## 2020-07-27 DIAGNOSIS — M542 Cervicalgia: Secondary | ICD-10-CM

## 2020-07-27 DIAGNOSIS — M25511 Pain in right shoulder: Secondary | ICD-10-CM

## 2020-07-27 DIAGNOSIS — R293 Abnormal posture: Secondary | ICD-10-CM

## 2020-07-27 NOTE — Patient Instructions (Signed)
Access Code: [email or web address] URL: [email or web address] Date: 07/27/2020 Prepared by: Kearney Hard  Exercises Seated Assisted Cervical Rotation with Towel - 2 x daily - 7 x weekly - 5 reps - 10 seconds hold Seated Upper Trap Stretch - 2 x daily - 7 x weekly - 5 reps - 10 seconds hold 3 Finger Cervical Rotation - 2 x daily - 7 x weekly - 5 reps - 5 seconds hold Supine Cervical Retraction with Towel - 2-3 x daily - 7 x weekly - 10 reps - 5 seconds hold

## 2020-07-27 NOTE — Therapy (Signed)
[facility] [address] [place], [place], [address] Phone: [phone]   Fax:  [phone]  Physical Therapy Evaluation  Patient Details  Name: Briana Ferguson MRN: [id number] Date of Birth: Apr 07, 1961 Referring Provider (PT): Dr. Georgina Snell   Encounter Date: 07/27/2020   PT End of Session - 07/27/20 1623    Visit Number 1    Number of Visits 12    Date for PT Re-Evaluation 10/19/20    Authorization Type [employer], $52 co-pay    PT Start Time 1522    PT Stop Time 1610    PT Time Calculation (min) 48 min    Activity Tolerance Patient tolerated treatment well    Behavior During Therapy [facility] for tasks assessed/performed           Past Medical History:  Diagnosis Date  . Anemia    "as a child"  . Anxiety    no per pt  . Arthritis   . Attention and concentration deficit 2011   had med trial Dr Candis Schatz ? dx   . Back pain   . Bilateral carpal tunnel syndrome   . Diabetes mellitus without complication ([place])   . Dyspnea   . Environmental allergies    Cats  . GERD (gastroesophageal reflux disease)   . IBS (irritable bowel syndrome)   . Joint pain   . Lactose intolerance   . Lower extremity edema   . Multiple food allergies   . Other fatigue   . Prediabetes   . Shortness of breath on exertion   . Sleep apnea    wears CPAP  . Swallowing difficulty   . Tubular adenoma of colon   . Vitamin D deficiency     Past Surgical History:  Procedure Laterality Date  . COLONOSCOPY    . NO PAST SURGERIES      There were no vitals filed for this visit.    Subjective Assessment - 07/27/20 1527    Subjective Pt arriving to therpay reporting beginning of pain in thoracic spine and neck pain which radiates down right upper extremity. Pt repoting still numbness in finger tips from episode over a month ago. Pt has a history of bilateral carpel tunnel.    Pertinent History h/o carpel tunnel bilaterally    Diagnostic tests X-ray: cervical rib at C7     Patient Stated Goals "I would like for a normalacy of my hand to come back", "I would like to be able to grip things again", "get relief in my mid back"    Currently in Pain? No/denies    Pain Radiating Towards down right arm    Pain Onset More than a month ago    Pain Frequency Intermittent    Aggravating Factors  sitting certain positions    Pain Relieving Factors changing positions, armotherapy/essential oils, gabapentin    Effect of Pain on Daily Activities diffculty with grip strength, difficulty recognizing objects with just her hand              [facility] PT Assessment - 07/27/20 0001      Assessment   Medical Diagnosis cervical radiculitis M54.12,    Referring Provider (PT) Dr. Georgina Snell    Onset Date/Surgical Date --   over a month ago   Hand Dominance Right    Next MD Visit f/u after therapy    Prior Therapy no      Precautions   Precautions None      Restrictions   Weight Bearing Restrictions No  Balance Screen   Has the patient fallen in the past 6 months No    Is the patient reluctant to leave their home because of a fear of falling?  No      Home Environment   Living Environment Private residence    Living Arrangements Spouse/significant other    Type of [place] to enter    Entrance Stairs-Number of Steps 2    Entrance Stairs-Rails None    Home Layout Two level    Alternate Level Stairs-Number of Steps 15    Alternate Level Stairs-Rails Right      Prior Function   Level of Independence Independent      Cognition   Overall Cognitive Status Within Functional Limits for tasks assessed      Observation/Other Assessments   Focus on Therapeutic Outcomes (FOTO)  56.7 (predicted 68)      Sensation   Additional Comments impaired by pt's report      Posture/Postural Control   Posture/Postural Control Postural limitations    Postural Limitations Rounded Shoulders;Forward head;Increased thoracic kyphosis      ROM / Strength   AROM /  PROM / Strength AROM;Strength      AROM   Overall AROM  Deficits    AROM Assessment Site Cervical    Cervical Flexion 28    Cervical Extension 12    Cervical - Right Side Bend 25    Cervical - Left Side Bend 28    Cervical - Right Rotation 58    Cervical - Left Rotation 68      Strength   Overall Strength Deficits    Overall Strength Comments R: 34.9 ppsi, L 26.1 ppsi    Strength Assessment Site Shoulder    Right/Left Shoulder Right;Left    Right Shoulder Flexion 4+/5    Right Shoulder Extension 4/5    Right Shoulder ABduction 4/5    Right Shoulder Internal Rotation 4/5    Right Shoulder External Rotation 4/5    Right Shoulder Horizontal ABduction 4/5    Left Shoulder Flexion 5/5    Left Shoulder Extension 5/5    Left Shoulder ABduction 5/5    Left Shoulder Internal Rotation 5/5    Left Shoulder External Rotation 5/5    Left Shoulder Horizontal ABduction 5/5      Palpation   Palpation comment TTP: bilateral upper traps, right sided thoracic paraspinals      Ambulation/Gait   Gait Pattern Within Functional Limits                      Objective measurements completed on examination: See above findings.               PT Education - 07/27/20 1622    Education Details PT POC, HEP (handout issued), DN (handout issued)    Person(s) Educated Patient    Methods Explanation;Demonstration;Handout;Verbal cues;Tactile cues    Comprehension Returned demonstration;Verbalized understanding            PT Short Term Goals - 07/27/20 1627      PT SHORT TERM GOAL #1   Title Pt will be independent in her HEP.    Time 3    Period Weeks    Status New    Target Date 08/17/20             PT Long Term Goals - 07/27/20 1627      PT LONG TERM GOAL #1  Title Pt will be independent in her advanced HEP.    Time 12    Period Weeks    Status New    Target Date 10/19/20      PT LONG TERM GOAL #2   Title Pt will improve bilateral cervical rotation to </  70 degrees with no pain.    Time 12    Period Weeks    Status New    Target Date 10/19/20      PT LONG TERM GOAL #3   Title Pt will improve her FOTO score from 56.7 to >/= 68.    Baseline 56.7 on 07/27/2020    Time 12    Period Weeks    Status New    Target Date 10/19/20      PT LONG TERM GOAL #4   Title Pt will improve her R UE strength to 5/5.    Baseline see flow sheets    Time 12    Period Weeks    Status New    Target Date 10/19/20      PT LONG TERM GOAL #5   Title Pt will be able to report pain </= 3/10 in her neck and shoulders after a shift.    Time 12    Period Weeks    Status New                  Plan - 07/27/20 1646    Clinical Impression Statement Pt arriving today for PT evaluation of her acute onset of cervical pain with radiation down right upper extremity into fingers. X-ray revealed C7 rib.  Pt reporting numbness in radial nerve distribution. Pt has a history of bilateral carpel tunnel.Pt also reporting pain in thoracic spine more on the right side with sharp  pains that come and go.  Pt reports she has been trying to correct her posture when sitting at her desk most of the day. Pt presenting with weakness in right upper extremity compared to left. Pt with decreased ROM in her cervical spine with pain noted with extension and rotational movements. Pt reporting difficulty with sleeping due to neck pain. Skilled PT needed to address pt's impairments with the below inteventions.    Personal Factors and Comorbidities Comorbidity 3+    Comorbidities DM, arthritis, back pain, carpel tunnel, dyspnea, IBS, GERD, sleep apnea, C7 rib    Examination-Activity Limitations Lift;Sit;Carry    Examination-Participation Restrictions Other;Occupation;Community Activity    Stability/Clinical Decision Making Evolving/Moderate complexity    Clinical Decision Making Moderate    Rehab Potential Good    PT Frequency 1x / week    PT Duration 12 weeks    PT  Treatment/Interventions ADLs/Self Care Home Management;Cryotherapy;Electrical Stimulation;Ultrasound;Functional mobility training;Therapeutic activities;Therapeutic exercise;Neuromuscular re-education;Patient/family education;Moist Heat;Dry needling;Manual techniques;Passive range of motion;Taping    PT Next Visit Plan DN assessment to bilateral upper traps, cervical ROM, shoulder strengthening, Radial nerve flossing    PT Home Exercise Plan Access Code: [email or web address]  URL: [email or web address]  Date: 07/27/2020  Prepared by: Kearney Hard    Exercises  Seated Assisted Cervical Rotation with Towel - 2 x daily - 7 x weekly - 5 reps - 10 seconds hold  Seated Upper Trap Stretch - 2 x daily - 7 x weekly - 5 reps - 10 seconds hold  3 Finger Cervical Rotation - 2 x daily - 7 x weekly - 5 reps - 5 seconds hold  Supine Cervical Retraction with Towel - 2-3 x daily - 7 x weekly - 10  reps - 5 seconds hold    Consulted and Agree with Plan of Care Patient           Patient will benefit from skilled therapeutic intervention in order to improve the following deficits and impairments:  Pain,Postural dysfunction,Decreased strength,Impaired flexibility,Impaired UE functional use,Decreased range of motion  Visit Diagnosis: Cervicalgia  Muscle weakness (generalized)  Acute pain of left shoulder  Acute pain of right shoulder  Pain in thoracic spine  Abnormal posture     Problem List Patient Active Problem List   Diagnosis Date Noted  . Healthcare maintenance 12/20/2018  . Snoring 01/27/2015  . Elevated blood pressure reading 01/28/2014  . Other fatigue 01/28/2014  . Visit for preventive health examination 01/28/2014  . BMI 36.0-36.9,adult 12/28/2011  . CTS (carpal tunnel syndrome) 12/28/2011  . History of falling 03/16/2011  . Dizziness 03/16/2011  . Numbness 03/16/2011  . OSA (obstructive sleep apnea) 11/22/2010  . Routine general medical examination at a health care facility  11/22/2010  . Routine gynecological examination 11/22/2010  . GOITER, UNSPECIFIED 11/22/2007  . BACK PAIN 11/22/2007  . NECK PAIN 08/14/2007  . VAGINITIS 07/24/2007  . HOARSENESS 07/24/2007  . ALLERGIC RHINITIS 11/27/2006    Oretha Caprice, PT, MPT 07/27/2020, 5:05 PM  [facility] [address] [place], [place], [address] Phone: [phone]   Fax:  [phone]  Name: BRYANDA MIKEL MRN: [id number] Date of Birth: 07/04/1960

## 2020-08-03 ENCOUNTER — Ambulatory Visit (INDEPENDENT_AMBULATORY_CARE_PROVIDER_SITE_OTHER): Payer: BC Managed Care – PPO | Admitting: Adult Health

## 2020-08-03 ENCOUNTER — Other Ambulatory Visit: Payer: Self-pay

## 2020-08-03 ENCOUNTER — Encounter (INDEPENDENT_AMBULATORY_CARE_PROVIDER_SITE_OTHER): Payer: Self-pay | Admitting: Adult Health

## 2020-08-03 VITALS — BP 127/84 | HR 77 | Temp 98.5°F | Ht 68.0 in | Wt 247.0 lb

## 2020-08-03 DIAGNOSIS — E559 Vitamin D deficiency, unspecified: Secondary | ICD-10-CM | POA: Diagnosis not present

## 2020-08-03 DIAGNOSIS — G4709 Other insomnia: Secondary | ICD-10-CM

## 2020-08-03 DIAGNOSIS — Z6837 Body mass index (BMI) 37.0-37.9, adult: Secondary | ICD-10-CM | POA: Diagnosis not present

## 2020-08-03 DIAGNOSIS — Z9189 Other specified personal risk factors, not elsewhere classified: Secondary | ICD-10-CM | POA: Diagnosis not present

## 2020-08-03 MED ORDER — VITAMIN D (ERGOCALCIFEROL) 1.25 MG (50000 UNIT) PO CAPS: 50000.0000 [IU] | ORAL_CAPSULE | ORAL | 0 refills | Status: DC

## 2020-08-04 DIAGNOSIS — Z6839 Body mass index (BMI) 39.0-39.9, adult: Secondary | ICD-10-CM | POA: Insufficient documentation

## 2020-08-04 DIAGNOSIS — G4709 Other insomnia: Secondary | ICD-10-CM | POA: Insufficient documentation

## 2020-08-04 DIAGNOSIS — E559 Vitamin D deficiency, unspecified: Secondary | ICD-10-CM | POA: Insufficient documentation

## 2020-08-04 NOTE — Progress Notes (Signed)
Chief Complaint:   OBESITY Briana Ferguson is here to discuss her progress with her obesity treatment plan along with follow-up of her obesity related diagnoses. Briana Ferguson is on the Category 2 Plan and states she is following her eating plan approximately 50% of the time. Briana Ferguson states she is doing housework 3 minutes 1 times per week.  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  She can easily consume breakfast and will often eat off plan for lunch and dinner- due to hectic schedule and not being home.  Subjective:   1. Vitamin D deficiency Briana Ferguson's Vitamin D level was 44.4 on 06/16/2020, just below goal of 50. She is currently taking prescription vitamin D 50,000 IU each week. She denies nausea, vomiting or muscle weakness.  2. Other insomnia Briana Ferguson feels sleep has improved with nightly CPAP and recent addition of gabapentin 300 mg QHS for cervical neck nerve pain.  3. At risk for osteoporosis Briana Ferguson is at higher risk of osteopenia and osteoporosis due to Vitamin D deficiency and obesity.  Assessment/Plan:   1. Vitamin D deficiency Low Vitamin D level contributes to fatigue and are associated with obesity, breast, and colon cancer. She agrees to continue to take prescription Vitamin D @50 ,000 IU every week and will follow-up for routine testing of Vitamin D, at least 2-3 times per year to avoid over-replacement.  - Vitamin D, Ergocalciferol, (DRISDOL) 1.25 MG (50000 UNIT) CAPS capsule; Take 1 capsule (50,000 Units total) by mouth every 7 (seven) days.  Dispense: 4 capsule; Refill: 0  2. Other insomnia The problem of recurrent insomnia was discussed. Orders and follow up as documented in patient record. Counseling: Intensive lifestyle modifications are the first line  treatment for this issue. We discussed several lifestyle modifications today and she will continue to work on diet, exercise and weight loss efforts. Continue nightly CPAP.  Counseling  Limit or avoid alcohol, caffeinated beverages, and cigarettes, especially close to bedtime.   Do not eat a large meal or eat spicy foods right before bedtime. This can lead to digestive discomfort that can make it hard for you to sleep.  Keep a sleep diary to help you and your health care provider figure out what could be causing your insomnia.  . Make your bedroom a dark, comfortable place where it is easy to fall asleep. ? Put up shades or blackout curtains to block light from outside. ? Use a white noise machine to block noise. ? Keep the temperature cool. . Limit screen use before bedtime. This includes: ? Watching TV. ? Using your smartphone, tablet, or computer. . Stick to a routine that includes going to bed and waking up at the same times every day and night. This can help you fall asleep faster. Consider making a quiet activity, such as reading, part of your nighttime routine. . Try to avoid taking naps during the day so that you sleep better at night. . Get out of bed if you are still awake after 15 minutes of trying to sleep. Keep the lights down, but try reading or doing a quiet activity. When you feel sleepy, go back to bed.  3. At risk for osteoporosis Briana Ferguson was given approximately 15 minutes of osteoporosis prevention counseling today. Briana Ferguson is at risk for osteopenia and  osteoporosis due to her Vitamin D deficiency. She was encouraged to take her Vitamin D and follow her higher calcium diet and increase strengthening exercise to help strengthen her bones and decrease her risk of osteopenia and osteoporosis.  Repetitive spaced learning was employed today to elicit superior memory formation and behavioral change.  4. Class 2 severe obesity with serious comorbidity and body mass index (BMI) of  37.0 to 37.9 in adult, unspecified obesity type (HCC) Briana Ferguson is currently in the action stage of change. As such, her goal is to continue with weight loss efforts. She has agreed to the Category 2 Plan and keeping a food journal and adhering to recommended goals of 300-400 calories and 30 g protein with lunch and 400-500 calories and 35 g protein with dinner.   Handout: Recipe Guide  Exercise goals: As is  Behavioral modification strategies: increasing lean protein intake, meal planning and cooking strategies and planning for success.  Briana Ferguson has agreed to follow-up with our clinic in 2 weeks. She was informed of the importance of frequent follow-up visits to maximize her success with intensive lifestyle modifications for her multiple health conditions.   Objective:   Blood pressure 127/84, pulse 77, temperature 98.5 F (36.9 C), height 5\' 8"  (1.727 m), weight 247 lb (112 kg), last menstrual period 04/03/2011, SpO2 98 %. Body mass index is 37.56 kg/m.  General: Cooperative, alert, well developed, in no acute distress. HEENT: Conjunctivae and lids unremarkable. Cardiovascular: Regular rhythm.  Lungs: Normal work of breathing. Neurologic: No focal deficits.   Lab Results  Component Value Date   CREATININE 1.14 (H) 06/16/2020   BUN 13 06/16/2020   NA 141 06/16/2020   K 4.3 06/16/2020   CL 102 06/16/2020   CO2 26 06/16/2020   Lab Results  Component Value Date   ALT 12 06/16/2020   AST 17 06/16/2020   ALKPHOS 87 06/16/2020   BILITOT 0.4 06/16/2020   Lab Results  Component Value Date   HGBA1C 6.0 (H) 06/16/2020   HGBA1C 5.9 (H) 08/27/2019   HGBA1C 6.2 (H) 05/07/2019   HGBA1C 6.5 03/19/2019   Lab Results  Component Value Date   INSULIN 14.1 06/16/2020   INSULIN 18.9 05/07/2019   Lab Results  Component Value Date   TSH 2.190 06/16/2020   Lab Results  Component Value Date   CHOL 174 06/16/2020   HDL 77 06/16/2020   LDLCALC 85 06/16/2020   TRIG 62 06/16/2020    CHOLHDL 3 03/19/2019   Lab Results  Component Value Date   WBC 4.4 06/16/2020   HGB 13.1 06/16/2020   HCT 40.1 06/16/2020   MCV 87 06/16/2020   PLT 294 06/16/2020    Attestation Statements:   Reviewed by clinician on day of visit: allergies, medications, problem list, medical history, surgical history, family history, social history, and previous encounter notes.  Coral Ceo, am acting as Location manager for Mina Marble, NP.  I have reviewed the above documentation for accuracy and completeness, and I agree with the above. -  Keirstyn Aydt d. Keiasia Christianson, NP-C

## 2020-08-17 ENCOUNTER — Ambulatory Visit: Payer: BC Managed Care – PPO | Admitting: Physical Therapy

## 2020-08-17 ENCOUNTER — Encounter: Payer: Self-pay | Admitting: Physical Therapy

## 2020-08-17 ENCOUNTER — Other Ambulatory Visit: Payer: Self-pay

## 2020-08-17 DIAGNOSIS — M25512 Pain in left shoulder: Secondary | ICD-10-CM

## 2020-08-17 DIAGNOSIS — R293 Abnormal posture: Secondary | ICD-10-CM

## 2020-08-17 DIAGNOSIS — M546 Pain in thoracic spine: Secondary | ICD-10-CM

## 2020-08-17 DIAGNOSIS — M25511 Pain in right shoulder: Secondary | ICD-10-CM

## 2020-08-17 DIAGNOSIS — M542 Cervicalgia: Secondary | ICD-10-CM

## 2020-08-17 DIAGNOSIS — M6281 Muscle weakness (generalized): Secondary | ICD-10-CM

## 2020-08-18 NOTE — Therapy (Signed)
[facility] [address] [place], [place], [address] Phone: [phone]   Fax:  [phone]  Patient Details  Name: Briana Ferguson MRN: [id number] Date of Birth: 11/30/1960 Referring Provider:  Gregor Hams, MD  Encounter Date: 08/17/2020  Pt arriving for PT visit but reporting she can only stay for 15 minutes of treatment. Due to pt's high co-pay of $52 we deferred treatment session to a later time where pt can benefit from a full 40-45 minute session. Pt's visit was cancelled and pt rescheduled her appointment.    Oretha Caprice, PT, MPT 08/18/2020, 11:19 AM  [facility] [address] [place], [place], [address] Phone: [phone]   Fax:  [phone]

## 2020-08-21 ENCOUNTER — Ambulatory Visit: Payer: BC Managed Care – PPO | Admitting: Family Medicine

## 2020-08-24 ENCOUNTER — Ambulatory Visit (INDEPENDENT_AMBULATORY_CARE_PROVIDER_SITE_OTHER): Payer: BC Managed Care – PPO | Admitting: Family Medicine

## 2020-08-24 ENCOUNTER — Encounter: Payer: Self-pay | Admitting: Physical Therapy

## 2020-08-24 ENCOUNTER — Ambulatory Visit (INDEPENDENT_AMBULATORY_CARE_PROVIDER_SITE_OTHER): Payer: BC Managed Care – PPO | Admitting: Physical Therapy

## 2020-08-24 ENCOUNTER — Other Ambulatory Visit: Payer: Self-pay

## 2020-08-24 DIAGNOSIS — M6281 Muscle weakness (generalized): Secondary | ICD-10-CM | POA: Diagnosis not present

## 2020-08-24 DIAGNOSIS — M542 Cervicalgia: Secondary | ICD-10-CM | POA: Diagnosis not present

## 2020-08-24 DIAGNOSIS — M25512 Pain in left shoulder: Secondary | ICD-10-CM | POA: Diagnosis not present

## 2020-08-24 DIAGNOSIS — M25511 Pain in right shoulder: Secondary | ICD-10-CM | POA: Diagnosis not present

## 2020-08-24 DIAGNOSIS — M546 Pain in thoracic spine: Secondary | ICD-10-CM

## 2020-08-24 DIAGNOSIS — R293 Abnormal posture: Secondary | ICD-10-CM

## 2020-08-24 NOTE — Therapy (Signed)
[facility] [address] [place], [place], [address] Phone: [phone]   Fax:  [phone]  Physical Therapy Treatment  Patient Details  Name: Briana Ferguson MRN: [id number] Date of Birth: 29-May-1960 Referring Provider (PT): Dr. Georgina Snell   Encounter Date: 08/24/2020   PT End of Session - 08/24/20 1432    Visit Number 2    Number of Visits 12    Date for PT Re-Evaluation 10/19/20    Authorization Type BCBS, $52 co-pay    PT Start Time 1425    PT Stop Time 1505    PT Time Calculation (min) 40 min    Activity Tolerance Patient tolerated treatment well    Behavior During Therapy [facility] for tasks assessed/performed           Past Medical History:  Diagnosis Date  . Anemia    "as a child"  . Anxiety    no per pt  . Arthritis   . Attention and concentration deficit 2011   had med trial Dr Candis Schatz ? dx   . Back pain   . Bilateral carpal tunnel syndrome   . Diabetes mellitus without complication ([place])   . Dyspnea   . Environmental allergies    Cats  . GERD (gastroesophageal reflux disease)   . IBS (irritable bowel syndrome)   . Joint pain   . Lactose intolerance   . Lower extremity edema   . Multiple food allergies   . Other fatigue   . Prediabetes   . Shortness of breath on exertion   . Sleep apnea    wears CPAP  . Swallowing difficulty   . Tubular adenoma of colon   . Vitamin D deficiency     Past Surgical History:  Procedure Laterality Date  . COLONOSCOPY    . NO PAST SURGERIES      There were no vitals filed for this visit.   Subjective Assessment - 08/24/20 1429    Subjective Pt arriving today reporting no pain in her shoulders or neck. Pt did however report pain when first waking up. Pt reporting no numbness in her fingers today but yesterday was a bad day.    Pertinent History h/o carpel tunnel bilaterally    Diagnostic tests X-ray: cervical rib at C7    Patient Stated Goals "I would like for a normalacy of my  hand to come back", "I would like to be able to grip things again", "get relief in my mid back"    Pain Onset More than a month ago                             [facility] Adult PT Treatment/Exercise - 08/24/20 0001      Exercises   Exercises Neck      Neck Exercises: Machines for Strengthening   UBE (Upper Arm Bike) 6 minutes today alternating every minute forward/back      Neck Exercises: Standing   Neck Retraction 10 reps;5 secs    Other Standing Exercises rows with green theraband x20 holding 2-3 seconds each      Manual Therapy   Manual therapy comments Passive radial nerve flossing, active trigger point release to right upper trap. Percussion to right paraspinals      Neck Exercises: Stretches   Upper Trapezius Stretch Left;Right;10 seconds;5 reps    Levator Stretch Right;Left;2 reps;10 seconds    Other Neck Stretches active radial nerve flossing x 10  Other Neck Stretches cervical sidebending x 3 holding 5 seconds                    PT Short Term Goals - 08/24/20 1432      PT SHORT TERM GOAL #1   Title Pt will be independent in her HEP.    Status On-going             PT Long Term Goals - 08/24/20 1433      PT LONG TERM GOAL #1   Title Pt will be independent in her advanced HEP.    Status On-going      PT LONG TERM GOAL #2   Title Pt will improve bilateral cervical rotation to </ 70 degrees with no pain.    Status On-going      PT LONG TERM GOAL #3   Title Pt will improve her FOTO score from 56.7 to >/= 68.    Status On-going      PT LONG TERM GOAL #4   Title Pt will improve her R UE strength to 5/5.    Status On-going      PT LONG TERM GOAL #5   Title Pt will be able to report pain </= 3/10 in her neck and shoulders after a shift.    Status On-going                 Plan - 08/24/20 1545    Clinical Impression Statement Pt arriving today reporting no pain at rest, but increased pain when first getting up this morning  and with certain movements. Pt reporting compliance with her HEP. Pt still complaining of intermittent numnbess/tingling into her fingers and elbow. Pt reporting relief with manual cerical traction. Percussion performed to R shoulder, R wrist and R cercical paraspinals and upper trap. Pt reporting feeling better at end of session. Continue skilled PT.    Personal Factors and Comorbidities Comorbidity 3+    Comorbidities DM, arthritis, back pain, carpel tunnel, dyspnea, IBS, GERD, sleep apnea, C7 rib           Patient will benefit from skilled therapeutic intervention in order to improve the following deficits and impairments:     Visit Diagnosis: Cervicalgia  Muscle weakness (generalized)  Acute pain of left shoulder  Acute pain of right shoulder  Pain in thoracic spine  Abnormal posture     Problem List Patient Active Problem List   Diagnosis Date Noted  . Vitamin D deficiency 08/04/2020  . Other insomnia 08/04/2020  . Class 2 severe obesity with serious comorbidity and body mass index (BMI) of 37.0 to 37.9 in adult [facility]) 08/04/2020  . Healthcare maintenance 12/20/2018  . Snoring 01/27/2015  . Elevated blood pressure reading 01/28/2014  . Other fatigue 01/28/2014  . Visit for preventive health examination 01/28/2014  . BMI 36.0-36.9,adult 12/28/2011  . CTS (carpal tunnel syndrome) 12/28/2011  . History of falling 03/16/2011  . Dizziness 03/16/2011  . Numbness 03/16/2011  . OSA (obstructive sleep apnea) 11/22/2010  . Routine general medical examination at a health care facility 11/22/2010  . Routine gynecological examination 11/22/2010  . GOITER, UNSPECIFIED 11/22/2007  . BACK PAIN 11/22/2007  . NECK PAIN 08/14/2007  . VAGINITIS 07/24/2007  . HOARSENESS 07/24/2007  . ALLERGIC RHINITIS 11/27/2006    Oretha Caprice, PT, MPT 08/24/2020, 4:03 PM  [facility] Physical Therapy [address] [place], [place], [address] Phone: [phone]   Fax:   [phone]  Name: MAHIMA HOTTLE  MRN: [id number] Date of Birth: 1961-03-17

## 2020-08-25 ENCOUNTER — Other Ambulatory Visit (INDEPENDENT_AMBULATORY_CARE_PROVIDER_SITE_OTHER): Payer: Self-pay | Admitting: Family Medicine

## 2020-08-25 DIAGNOSIS — F3289 Other specified depressive episodes: Secondary | ICD-10-CM

## 2020-08-25 MED ORDER — BUPROPION HCL ER (SR) 200 MG PO TB12: 200.0000 mg | ORAL_TABLET | Freq: Every morning | ORAL | 0 refills | Status: DC

## 2020-08-25 NOTE — Telephone Encounter (Signed)
Pt last seen by Katy Danford, FNP.  

## 2020-08-25 NOTE — Telephone Encounter (Signed)
Refill request

## 2020-08-31 ENCOUNTER — Encounter: Payer: BC Managed Care – PPO | Admitting: Physical Therapy

## 2020-09-07 ENCOUNTER — Other Ambulatory Visit: Payer: Self-pay

## 2020-09-07 ENCOUNTER — Ambulatory Visit (INDEPENDENT_AMBULATORY_CARE_PROVIDER_SITE_OTHER): Payer: BC Managed Care – PPO | Admitting: Adult Health

## 2020-09-07 ENCOUNTER — Encounter: Payer: Self-pay | Admitting: Physical Therapy

## 2020-09-07 ENCOUNTER — Encounter (INDEPENDENT_AMBULATORY_CARE_PROVIDER_SITE_OTHER): Payer: Self-pay | Admitting: Adult Health

## 2020-09-07 ENCOUNTER — Ambulatory Visit: Payer: BC Managed Care – PPO | Admitting: Physical Therapy

## 2020-09-07 VITALS — BP 145/74 | HR 82 | Temp 98.1°F | Ht 68.0 in | Wt 245.0 lb

## 2020-09-07 DIAGNOSIS — M546 Pain in thoracic spine: Secondary | ICD-10-CM

## 2020-09-07 DIAGNOSIS — E559 Vitamin D deficiency, unspecified: Secondary | ICD-10-CM

## 2020-09-07 DIAGNOSIS — M25511 Pain in right shoulder: Secondary | ICD-10-CM | POA: Diagnosis not present

## 2020-09-07 DIAGNOSIS — E119 Type 2 diabetes mellitus without complications: Secondary | ICD-10-CM | POA: Diagnosis not present

## 2020-09-07 DIAGNOSIS — F32A Depression, unspecified: Secondary | ICD-10-CM | POA: Insufficient documentation

## 2020-09-07 DIAGNOSIS — M6281 Muscle weakness (generalized): Secondary | ICD-10-CM | POA: Diagnosis not present

## 2020-09-07 DIAGNOSIS — M25512 Pain in left shoulder: Secondary | ICD-10-CM

## 2020-09-07 DIAGNOSIS — Z9189 Other specified personal risk factors, not elsewhere classified: Secondary | ICD-10-CM

## 2020-09-07 DIAGNOSIS — M542 Cervicalgia: Secondary | ICD-10-CM

## 2020-09-07 DIAGNOSIS — Z6837 Body mass index (BMI) 37.0-37.9, adult: Secondary | ICD-10-CM

## 2020-09-07 DIAGNOSIS — F3289 Other specified depressive episodes: Secondary | ICD-10-CM | POA: Diagnosis not present

## 2020-09-07 DIAGNOSIS — R293 Abnormal posture: Secondary | ICD-10-CM

## 2020-09-07 MED ORDER — VITAMIN D (ERGOCALCIFEROL) 1.25 MG (50000 UNIT) PO CAPS: 50000.0000 [IU] | ORAL_CAPSULE | ORAL | 0 refills | Status: DC

## 2020-09-07 NOTE — Therapy (Signed)
[facility] Physical Therapy [address] [place], [place], [address] Phone: [phone]   Fax:  [phone]  Physical Therapy Treatment  Patient Details  Name: Briana Ferguson MRN: [id number] Date of Birth: 08/16/1960 Referring Provider (PT): Dr. Georgina Snell   Encounter Date: 09/07/2020   PT End of Session - 09/07/20 1620    Visit Number 3    Number of Visits 12    Date for PT Re-Evaluation 10/19/20    Authorization Type BCBS, $52 co-pay    PT Start Time 1515    PT Stop Time 1600    PT Time Calculation (min) 45 min    Activity Tolerance Patient tolerated treatment well    Behavior During Therapy [facility] for tasks assessed/performed           Past Medical History:  Diagnosis Date  . Anemia    "as a child"  . Anxiety    no per pt  . Arthritis   . Attention and concentration deficit 2011   had med trial Dr Candis Schatz ? dx   . Back pain   . Bilateral carpal tunnel syndrome   . Diabetes mellitus without complication ([place])   . Dyspnea   . Environmental allergies    Cats  . GERD (gastroesophageal reflux disease)   . IBS (irritable bowel syndrome)   . Joint pain   . Lactose intolerance   . Lower extremity edema   . Multiple food allergies   . Other fatigue   . Prediabetes   . Shortness of breath on exertion   . Sleep apnea    wears CPAP  . Swallowing difficulty   . Tubular adenoma of colon   . Vitamin D deficiency     Past Surgical History:  Procedure Laterality Date  . COLONOSCOPY    . NO PAST SURGERIES      There were no vitals filed for this visit.   Subjective Assessment - 09/07/20 1612    Subjective Pt arriving today with no pain reported. Pt stating that she still has intermittent pain in her neck but it's much better. Pt stating stretches helps.    Pertinent History h/o carpel tunnel bilaterally    Diagnostic tests X-ray: cervical rib at C7    Patient Stated Goals "I would like for a normalacy of my hand to come back", "I would like to  be able to grip things again", "get relief in my mid back"                             [facility] Adult PT Treatment/Exercise - 09/07/20 0001      Exercises   Exercises Neck      Neck Exercises: Machines for Strengthening   Other Machines for Strengthening BATCA: rows 15# 2x10      Neck Exercises: Standing   Neck Retraction 10 reps;5 secs      Neck Exercises: Seated   Lateral Flexion Both;5 reps    Money 10 reps;3 secs      Neck Exercises: Supine   Cervical Rotation Both;5 reps    Other Supine Exercise lying on 1/2 bolster in horizontal and vertical position for thoracic stretching      Neck Exercises: Prone   Axial Exension 5 reps      Neck Exercises: Stretches   Upper Trapezius Stretch Left;Right;10 seconds;5 reps    Levator Stretch Right;Left;2 reps;10 seconds    Corner Stretch 10 seconds;5 reps    Other  Neck Stretches active radial nerve flossing x 10    Other Neck Stretches cervical sidebending x 3 holding 5 seconds                    PT Short Term Goals - 09/07/20 1623      PT SHORT TERM GOAL #1   Title Pt will be independent in her HEP.    Status On-going             PT Long Term Goals - 09/07/20 1623      PT LONG TERM GOAL #1   Title Pt will be independent in her advanced HEP.    Status On-going      PT LONG TERM GOAL #2   Title Pt will improve bilateral cervical rotation to </ 70 degrees with no pain.    Status On-going      PT LONG TERM GOAL #3   Title Pt will improve her FOTO score from 56.7 to >/= 68.    Status On-going      PT LONG TERM GOAL #4   Title Pt will improve her R UE strength to 5/5.    Status On-going      PT LONG TERM GOAL #5   Title Pt will be able to report pain </= 3/10 in her neck and shoulders after a shift.    Time 12    Status On-going                 Plan - 09/07/20 1621    Clinical Impression Statement Pt arriving to therapy today reporting no neck pain. Pt reporting her stretching  has helped. Pt tolerating cervical and thoracic spine stretching and general strengthening exercises. Cotninue skilled PT to maximize function.    Personal Factors and Comorbidities Comorbidity 3+    Comorbidities DM, arthritis, back pain, carpel tunnel, dyspnea, IBS, GERD, sleep apnea, C7 rib    Examination-Activity Limitations Lift;Sit;Carry    Examination-Participation Restrictions Other;Occupation;Community Activity    Stability/Clinical Decision Making Evolving/Moderate complexity    Rehab Potential Good    PT Frequency 1x / week    PT Duration 12 weeks    PT Treatment/Interventions ADLs/Self Care Home Management;Cryotherapy;Electrical Stimulation;Ultrasound;Functional mobility training;Therapeutic activities;Therapeutic exercise;Neuromuscular re-education;Patient/family education;Moist Heat;Dry needling;Manual techniques;Passive range of motion;Taping    PT Next Visit Plan cervical ROM, shoulder strengthening, thoarcic stretching,  Radial nerve flossing    PT Home Exercise Plan Access Code: [email or web address]  URL: [email or web address]  Date: 07/27/2020  Prepared by: Kearney Hard    Exercises  Seated Assisted Cervical Rotation with Towel - 2 x daily - 7 x weekly - 5 reps - 10 seconds hold  Seated Upper Trap Stretch - 2 x daily - 7 x weekly - 5 reps - 10 seconds hold  3 Finger Cervical Rotation - 2 x daily - 7 x weekly - 5 reps - 5 seconds hold  Supine Cervical Retraction with Towel - 2-3 x daily - 7 x weekly - 10 reps - 5 seconds hold    Consulted and Agree with Plan of Care Patient           Patient will benefit from skilled therapeutic intervention in order to improve the following deficits and impairments:  Pain,Postural dysfunction,Decreased strength,Impaired flexibility,Impaired UE functional use,Decreased range of motion  Visit Diagnosis: Cervicalgia  Muscle weakness (generalized)  Acute pain of left shoulder  Acute pain of right shoulder  Pain in thoracic  spine  Abnormal posture     Problem  List Patient Active Problem List   Diagnosis Date Noted  . Vitamin D deficiency 08/04/2020  . Other insomnia 08/04/2020  . Class 2 severe obesity with serious comorbidity and body mass index (BMI) of 37.0 to 37.9 in adult [facility]) 08/04/2020  . Healthcare maintenance 12/20/2018  . Snoring 01/27/2015  . Elevated blood pressure reading 01/28/2014  . Other fatigue 01/28/2014  . Visit for preventive health examination 01/28/2014  . BMI 36.0-36.9,adult 12/28/2011  . CTS (carpal tunnel syndrome) 12/28/2011  . History of falling 03/16/2011  . Dizziness 03/16/2011  . Numbness 03/16/2011  . OSA (obstructive sleep apnea) 11/22/2010  . Routine general medical examination at a health care facility 11/22/2010  . Routine gynecological examination 11/22/2010  . GOITER, UNSPECIFIED 11/22/2007  . BACK PAIN 11/22/2007  . NECK PAIN 08/14/2007  . VAGINITIS 07/24/2007  . HOARSENESS 07/24/2007  . ALLERGIC RHINITIS 11/27/2006    Oretha Caprice, PT, MPT 09/07/2020, 4:29 PM  [facility] Physical Therapy [address] [place], [place], [address] Phone: [phone]   Fax:  [phone]  Name: SANAAI DOANE MRN: [id number] Date of Birth: Oct 07, 1960

## 2020-09-07 NOTE — Progress Notes (Signed)
Chief Complaint:   OBESITY Briana Ferguson is here to discuss her progress with her obesity treatment plan along with follow-up of her obesity related diagnoses. Briana Ferguson is on the Category 2 Plan with 300-400 calories and 30 grams protein for lunch and 400-500 calories and 35 grams protein for dinner and states she is following her eating plan approximately 50% of the time. Briana Ferguson states she is walking 10-15 minutes 1 times per week.  Today's visit was #: 5 Starting weight: 242 lbs Starting date: 06/16/2020 Today's weight: 245 lbs Today's date: 09/07/2020 Total lbs lost to date: 0 Total lbs lost since last in-office visit: 2  Interim History: Briana Ferguson reports having cal/protein guidelines for lunch and dinner has been very helpful and has helped improved plan compliance.  She will often eat out lunch/dinner due to a hectic work schedule.  Subjective:   1. Vitamin D deficiency Briana Ferguson's Vitamin D level was 44.4 on 06/16/2020, which is below goal of 50. She is currently taking prescription vitamin D 50,000 IU each week. She denies nausea, vomiting or muscle weakness.  2. Type 2 diabetes mellitus without complication, without long-term current use of insulin (HCC) Highest A1c 6.5 in 02/2019. Briana Ferguson's most recent A1c 6.0 on 06/16/2020. She was previously on Metformin 500 mg QD-last taken 05/2020.  She reports tolerating Metformin, stopped Rx due to a concerns of cardiac damage due to [place].  Discussed at length the risks/benefits of Metformin.  Lab Results  Component Value Date   HGBA1C 6.0 (H) 06/16/2020   HGBA1C 5.9 (H) 08/27/2019   HGBA1C 6.2 (H) 05/07/2019   Lab Results  Component Value Date   LDLCALC 85 06/16/2020   CREATININE 1.14 (H) 06/16/2020   Lab Results  Component Value Date   INSULIN 14.1 06/16/2020   INSULIN 18.9 05/07/2019    3. Other depression with emotional eating BP/HR stable at OV. Briana Ferguson reports increase in energy and decreased in cravings on Bupropion SR 200 mg QD. She  wants to complete last refill and determine necessity of medication at next OV.   4. At risk for hyperglycemia Briana Ferguson is at risk for hyperglycemia due to obesity and type 2 diabetes and not being on any prescription medications.  Assessment/Plan:   1. Vitamin D deficiency Low Vitamin D level contributes to fatigue and are associated with obesity, breast, and colon cancer. She agrees to continue to take prescription Vitamin D @50 ,000 IU every week and will follow-up for routine testing of Vitamin D, at least 2-3 times per year to avoid over-replacement. -Check labs at next OV. - Vitamin D, Ergocalciferol, (DRISDOL) 1.25 MG (50000 UNIT) CAPS capsule; Take 1 capsule (50,000 Units total) by mouth every 7 (seven) days.  Dispense: 4 capsule; Refill: 0  2. Type 2 diabetes mellitus without complication, without long-term current use of insulin (HCC) Good blood sugar control is important to decrease the likelihood of diabetic complications such as nephropathy, neuropathy, limb loss, blindness, coronary artery disease, and death. Intensive lifestyle modification including diet, exercise and weight loss are the first line of treatment for diabetes.  -Check labs at next OV- consider Metformin or GLP-1 therapy after labs result. -Continue category 2 meal plan  3. Other depression with emotional eating Behavior modification techniques were discussed today to help Briana Ferguson deal with her emotional/non-hunger eating behaviors.  Orders and follow up as documented in patient record.  -Continue bupropion SR 200 mg QD.  4. At risk for hyperglycemia Briana Ferguson was given approximately 15 minutes of counseling today regarding  prevention of hyperglycemia. She was advised of hyperglycemia causes and the fact hyperglycemia is often asymptomatic. Briana Ferguson was instructed to avoid skipping meals, eat regular protein rich meals and schedule low calorie but protein rich snacks as needed.   Repetitive spaced learning was employed today  to elicit superior memory formation and behavioral change  5. Class 2 severe obesity with serious comorbidity and body mass index (BMI) of 37.0 to 37.9 in adult, unspecified obesity type (HCC)  Briana Ferguson is currently in the action stage of change. As such, her goal is to continue with weight loss efforts. She has agreed to the Category 2 Plan with 300-400 calories and 30 grams protein for lunch and 400-500 calories and 35 grams protein for dinner.   Check fasting labs at next OV.  Exercise goals: As is  Behavioral modification strategies: increasing lean protein intake, decreasing simple carbohydrates, meal planning and cooking strategies and planning for success.  Briana Ferguson has agreed to follow-up with our clinic fasting in 3 weeks. She was informed of the importance of frequent follow-up visits to maximize her success with intensive lifestyle modifications for her multiple health conditions.   Objective:   Blood pressure (!) 145/74, pulse 82, temperature 98.1 F (36.7 C), height 5\' 8"  (1.727 m), weight 245 lb (111.1 kg), last menstrual period 04/03/2011, SpO2 97 %. Body mass index is 37.25 kg/m.  General: Cooperative, alert, well developed, in no acute distress. HEENT: Conjunctivae and lids unremarkable. Cardiovascular: Regular rhythm.  Lungs: Normal work of breathing. Neurologic: No focal deficits.   Lab Results  Component Value Date   CREATININE 1.14 (H) 06/16/2020   BUN 13 06/16/2020   NA 141 06/16/2020   K 4.3 06/16/2020   CL 102 06/16/2020   CO2 26 06/16/2020   Lab Results  Component Value Date   ALT 12 06/16/2020   AST 17 06/16/2020   ALKPHOS 87 06/16/2020   BILITOT 0.4 06/16/2020   Lab Results  Component Value Date   HGBA1C 6.0 (H) 06/16/2020   HGBA1C 5.9 (H) 08/27/2019   HGBA1C 6.2 (H) 05/07/2019   HGBA1C 6.5 03/19/2019   Lab Results  Component Value Date   INSULIN 14.1 06/16/2020   INSULIN 18.9 05/07/2019   Lab Results  Component Value Date   TSH 2.190  06/16/2020   Lab Results  Component Value Date   CHOL 174 06/16/2020   HDL 77 06/16/2020   LDLCALC 85 06/16/2020   TRIG 62 06/16/2020   CHOLHDL 3 03/19/2019   Lab Results  Component Value Date   WBC 4.4 06/16/2020   HGB 13.1 06/16/2020   HCT 40.1 06/16/2020   MCV 87 06/16/2020   PLT 294 06/16/2020   No results found for: IRON, TIBC, FERRITIN   Attestation Statements:   Reviewed by clinician on day of visit: allergies, medications, problem list, medical history, surgical history, family history, social history, and previous encounter notes.  Coral Ceo, CMA, am acting as transcriptionist for Mina Marble, NP.  I have reviewed the above documentation for accuracy and completeness, and I agree with the above. -  Briana Ferguson d. Hitoshi Werts, NP-C

## 2020-09-14 ENCOUNTER — Encounter: Payer: BC Managed Care – PPO | Admitting: Physical Therapy

## 2020-09-22 ENCOUNTER — Ambulatory Visit: Payer: BC Managed Care – PPO | Admitting: Physical Therapy

## 2020-09-22 ENCOUNTER — Other Ambulatory Visit: Payer: Self-pay

## 2020-09-22 ENCOUNTER — Encounter: Payer: Self-pay | Admitting: Physical Therapy

## 2020-09-22 DIAGNOSIS — M25511 Pain in right shoulder: Secondary | ICD-10-CM

## 2020-09-22 DIAGNOSIS — M542 Cervicalgia: Secondary | ICD-10-CM | POA: Diagnosis not present

## 2020-09-22 DIAGNOSIS — M25512 Pain in left shoulder: Secondary | ICD-10-CM | POA: Diagnosis not present

## 2020-09-22 DIAGNOSIS — M546 Pain in thoracic spine: Secondary | ICD-10-CM

## 2020-09-22 DIAGNOSIS — M6281 Muscle weakness (generalized): Secondary | ICD-10-CM

## 2020-09-22 DIAGNOSIS — R293 Abnormal posture: Secondary | ICD-10-CM

## 2020-09-22 NOTE — Therapy (Addendum)
[facility] Physical Therapy [address] [place], [place], [address] Phone: [phone]   Fax:  [phone]  Physical Therapy Treatment/ Discharge   Patient Details  Name: Briana Ferguson MRN: [id number] Date of Birth: 1960/10/07 Referring Provider (PT): Dr. Georgina Snell   Encounter Date: 09/22/2020   PT End of Session - 09/22/20 1525     Visit Number 4    Number of Visits 12    Date for PT Re-Evaluation 10/19/20    Authorization Type [employer], $52 co-pay    PT Start Time 1516    PT Stop Time 1555    PT Time Calculation (min) 39 min    Activity Tolerance Patient tolerated treatment well    Behavior During Therapy [facility] for tasks assessed/performed             Past Medical History:  Diagnosis Date   Anemia    "as a child"   Anxiety    no per pt   Arthritis    Attention and concentration deficit 2011   had med trial Dr Candis Schatz ? dx    Back pain    Bilateral carpal tunnel syndrome    Diabetes mellitus without complication (HCC)    Dyspnea    Environmental allergies    Cats   GERD (gastroesophageal reflux disease)    IBS (irritable bowel syndrome)    Joint pain    Lactose intolerance    Lower extremity edema    Multiple food allergies    Other fatigue    Prediabetes    Shortness of breath on exertion    Sleep apnea    wears CPAP   Swallowing difficulty    Tubular adenoma of colon    Vitamin D deficiency     Past Surgical History:  Procedure Laterality Date   COLONOSCOPY     NO PAST SURGERIES      There were no vitals filed for this visit.   Subjective Assessment - 09/22/20 1524     Subjective Pt arriving today reporting no pain at rest, Pt reporting after working in the yard yesterday pulling weeds her back pain increased to 7/10, but after taking a hot bath and soaking in episom salts she felt better and woke up feeling better this morning.    Pertinent History h/o carpel tunnel bilaterally    Diagnostic tests X-ray: cervical rib at C7     Patient Stated Goals "I would like for a normalacy of my hand to come back", "I would like to be able to grip things again", "get relief in my mid back"    Currently in Pain? No/denies    Pain Onset More than a month ago                                         PT Short Term Goals - 09/22/20 1536       PT SHORT TERM GOAL #1   Title Pt will be independent in her HEP.    Status Achieved               PT Long Term Goals - 09/22/20 1536       PT LONG TERM GOAL #1   Title Pt will be independent in her advanced HEP.    Status On-going      PT LONG TERM GOAL #2   Title Pt will improve bilateral cervical rotation to </  70 degrees with no pain.    Status On-going      PT LONG TERM GOAL #3   Title Pt will improve her FOTO score from 56.7 to >/= 68.    Status On-going      PT LONG TERM GOAL #4   Title Pt will improve her R UE strength to 5/5.    Status On-going      PT LONG TERM GOAL #5   Title Pt will be able to report pain </= 3/10 in her neck and shoulders after a shift.    Status On-going                   Plan - 09/22/20 1526     Clinical Impression Statement Pt tolerating exercises and stretching. Pt reporting no pain upon arrival in her neck. Pt did however report finger tip going numb with overhead reaching. Pt tolerating exercises on foam rollers today progressing with thoracic mobility and extension. Continue skilled PT to maximize function.    Personal Factors and Comorbidities Comorbidity 3+    Comorbidities DM, arthritis, back pain, carpel tunnel, dyspnea, IBS, GERD, sleep apnea, C7 rib    Examination-Activity Limitations Lift;Sit;Carry    Examination-Participation Restrictions Other;Occupation;Community Activity    Stability/Clinical Decision Making Evolving/Moderate complexity    Rehab Potential Good    PT Frequency 1x / week    PT Treatment/Interventions ADLs/Self Care Home Management;Cryotherapy;Electrical  Stimulation;Ultrasound;Functional mobility training;Therapeutic activities;Therapeutic exercise;Neuromuscular re-education;Patient/family education;Moist Heat;Dry needling;Manual techniques;Passive range of motion;Taping    PT Next Visit Plan shoulder strengthening, thoarcic stretching and mobility, radial nerve flossing    PT Home Exercise Plan Access Code: [email or web address]  URL: [email or web address]  Date: 07/27/2020  Prepared by: Kearney Hard    Exercises  Seated Assisted Cervical Rotation with Towel - 2 x daily - 7 x weekly - 5 reps - 10 seconds hold  Seated Upper Trap Stretch - 2 x daily - 7 x weekly - 5 reps - 10 seconds hold  3 Finger Cervical Rotation - 2 x daily - 7 x weekly - 5 reps - 5 seconds hold  Supine Cervical Retraction with Towel - 2-3 x daily - 7 x weekly - 10 reps - 5 seconds hold    Consulted and Agree with Plan of Care Patient             Patient will benefit from skilled therapeutic intervention in order to improve the following deficits and impairments:  Pain,Postural dysfunction,Decreased strength,Impaired flexibility,Impaired UE functional use,Decreased range of motion  Visit Diagnosis: Cervicalgia  Muscle weakness (generalized)  Acute pain of left shoulder  Acute pain of right shoulder  Abnormal posture  Pain in thoracic spine     Problem List Patient Active Problem List   Diagnosis Date Noted   Type 2 diabetes mellitus without complication, without long-term current use of insulin ([place]) 09/07/2020   Depression 09/07/2020   Vitamin D deficiency 08/04/2020   Other insomnia 08/04/2020   Class 2 severe obesity with serious comorbidity and body mass index (BMI) of 37.0 to 37.9 in adult [facility]) 08/04/2020   Healthcare maintenance 12/20/2018   Snoring 01/27/2015   Elevated blood pressure reading 01/28/2014   Other fatigue 01/28/2014   Visit for preventive health examination 01/28/2014   BMI 36.0-36.9,adult 12/28/2011   CTS (carpal tunnel  syndrome) 12/28/2011   History of falling 03/16/2011   Dizziness 03/16/2011   Numbness 03/16/2011   OSA (obstructive sleep apnea) 11/22/2010   Routine general medical examination at a health  care facility 11/22/2010   Routine gynecological examination 11/22/2010   GOITER, UNSPECIFIED 11/22/2007   BACK PAIN 11/22/2007   NECK PAIN 08/14/2007   VAGINITIS 07/24/2007   HOARSENESS 07/24/2007   ALLERGIC RHINITIS 11/27/2006    Oretha Caprice, PT, MPT 09/22/2020, 3:39 PM  PHYSICAL THERAPY DISCHARGE SUMMARY  Visits from Start of Care: 4  Current functional level related to goals / functional outcomes: See note   Remaining deficits: See note   Education / Equipment: HEP   Patient agrees to discharge. Patient goals were partially met. Patient is being discharged due to not returning since the last visit.  Scot Jun, PT, DPT, OCS, ATC 10/14/20  11:41 AM     [facility] Physical Therapy [address] [place], [place], [address] Phone: [phone]   Fax:  [phone]  Name: Briana Ferguson MRN: [id number] Date of Birth: [birth year]

## 2020-09-29 ENCOUNTER — Encounter (INDEPENDENT_AMBULATORY_CARE_PROVIDER_SITE_OTHER): Payer: Self-pay | Admitting: Adult Health

## 2020-09-29 ENCOUNTER — Other Ambulatory Visit: Payer: Self-pay

## 2020-09-29 ENCOUNTER — Ambulatory Visit (INDEPENDENT_AMBULATORY_CARE_PROVIDER_SITE_OTHER): Payer: BC Managed Care – PPO | Admitting: Adult Health

## 2020-09-29 VITALS — BP 129/77 | HR 80 | Temp 98.9°F | Ht 68.0 in | Wt 245.0 lb

## 2020-09-29 DIAGNOSIS — E119 Type 2 diabetes mellitus without complications: Secondary | ICD-10-CM

## 2020-09-29 DIAGNOSIS — Z9189 Other specified personal risk factors, not elsewhere classified: Secondary | ICD-10-CM

## 2020-09-29 DIAGNOSIS — Z6839 Body mass index (BMI) 39.0-39.9, adult: Secondary | ICD-10-CM | POA: Diagnosis not present

## 2020-09-29 DIAGNOSIS — F3289 Other specified depressive episodes: Secondary | ICD-10-CM | POA: Diagnosis not present

## 2020-09-29 MED ORDER — BUPROPION HCL ER (SR) 200 MG PO TB12: 200.0000 mg | ORAL_TABLET | Freq: Every morning | ORAL | 0 refills | Status: DC

## 2020-10-05 NOTE — Progress Notes (Signed)
Chief Complaint:   OBESITY Briana Ferguson is here to discuss her progress with her obesity treatment plan along with follow-up of her obesity related diagnoses. Briana Ferguson is on the Category 2 Plan and states she is following her eating plan approximately 45% of the time. [name] states she is doing PT every other week, walking, and yard work 45 minutes 1-2 times per week.  Today's visit was #: 6 Starting weight: 242 lbs Starting date: 06/16/2020 Today's weight: 245 lbs Today's date: 09/29/2020 Total lbs lost to date: 0 Total lbs lost since last in-office visit: 0  Interim History: Briana Ferguson maintained her weight the last several weeks despite celebrating birthdays and Memorial Day holiday. She has utilized Network engineer when eating off plan.    Subjective:   1. Type 2 diabetes mellitus without complication, without long-term current use of insulin (HCC) 06/16/2020 A1c 6.0 with normal BG (99) and elevated insulin level (14.1). [name] was previously on Metformin in 2021 and tolerated it well. She felt Biguanide helped decrease cravings. She deferred re-starting Metformin.  2. Other depression with emotional eating BP/HR are stable at OV. [place] denies history of seizures.  She reports good cravings control when taking Bupropion SR 200 mg QD.  3. At risk for hyperglycemia Honest is at risk for hyperglycemia due to type 2 diabetes without medication.  Assessment/Plan:   1. Type 2 diabetes mellitus without complication, without long-term current use of insulin (HCC) Good blood sugar control is important to decrease the likelihood of diabetic complications such as nephropathy, neuropathy, limb loss, blindness, coronary artery disease, and death. Intensive lifestyle modification including diet, exercise and weight loss are the first line of treatment for diabetes.  --Check fasting labs at next OV.   2. Other depression with emotional eating Briana Ferguson is struggling with emotional eating and using food for comfort to  the extent that it is negatively impacting her health. She has been working on behavior modification techniques to help reduce her emotional eating and has been very successful. She shows no sign of suicidal or homicidal ideations.  - buPROPion (WELLBUTRIN SR) 200 MG 12 hr tablet; Take 1 tablet (200 mg total) by mouth in the morning.  Dispense: 30 tablet; Refill: 0  3. At risk for hyperglycemia Briana Ferguson was given approximately 15 minutes of counseling today regarding prevention of hyperglycemia. She was advised of hyperglycemia causes and the fact hyperglycemia is often asymptomatic. Briana Ferguson was instructed to avoid skipping meals, eat regular protein rich meals and schedule low calorie but protein rich snacks as needed.   Repetitive spaced learning was employed today to elicit superior memory formation and behavioral change   4. Obesity with current BMI 37.3  Briana Ferguson is currently in the action stage of change. As such, her goal is to continue with weight loss efforts. She has agreed to the Category 2 Plan.   Check fasting labs at next OV.  Exercise goals:  As is  Behavioral modification strategies: increasing lean protein intake, decreasing simple carbohydrates, decreasing eating out, meal planning and cooking strategies, keeping healthy foods in the home, and planning for success.  [name] has agreed to follow-up with our clinic in 3 weeks, fasting. She was informed of the importance of frequent follow-up visits to maximize her success with intensive lifestyle modifications for her multiple health conditions.   Objective:   Blood pressure 129/77, pulse 80, temperature 98.9 F (37.2 C), height 5\' 8"  (1.727 m), weight 245 lb (111.1 kg), last menstrual period 04/03/2011, SpO2 100 %. Body  mass index is 37.25 kg/m.  General: Cooperative, alert, well developed, in no acute distress. HEENT: Conjunctivae and lids unremarkable. Cardiovascular: Regular rhythm.  Lungs: Normal work of  breathing. Neurologic: No focal deficits.   Lab Results  Component Value Date   CREATININE 1.14 (H) 06/16/2020   BUN 13 06/16/2020   NA 141 06/16/2020   K 4.3 06/16/2020   CL 102 06/16/2020   CO2 26 06/16/2020   Lab Results  Component Value Date   ALT 12 06/16/2020   AST 17 06/16/2020   ALKPHOS 87 06/16/2020   BILITOT 0.4 06/16/2020   Lab Results  Component Value Date   HGBA1C 6.0 (H) 06/16/2020   HGBA1C 5.9 (H) 08/27/2019   HGBA1C 6.2 (H) 05/07/2019   HGBA1C 6.5 03/19/2019   Lab Results  Component Value Date   INSULIN 14.1 06/16/2020   INSULIN 18.9 05/07/2019   Lab Results  Component Value Date   TSH 2.190 06/16/2020   Lab Results  Component Value Date   CHOL 174 06/16/2020   HDL 77 06/16/2020   LDLCALC 85 06/16/2020   TRIG 62 06/16/2020   CHOLHDL 3 03/19/2019   Lab Results  Component Value Date   WBC 4.4 06/16/2020   HGB 13.1 06/16/2020   HCT 40.1 06/16/2020   MCV 87 06/16/2020   PLT 294 06/16/2020   No results found for: IRON, TIBC, FERRITIN  Attestation Statements:   Reviewed by clinician on day of visit: allergies, medications, problem list, medical history, surgical history, family history, social history, and previous encounter notes.  Coral Ceo, CMA, am acting as transcriptionist for Mina Marble, NP.  I have reviewed the above documentation for accuracy and completeness, and I agree with the above. -  Hannie Shoe d. Anaaya Fuster, NP-C

## 2020-11-03 ENCOUNTER — Ambulatory Visit (INDEPENDENT_AMBULATORY_CARE_PROVIDER_SITE_OTHER): Payer: BC Managed Care – PPO | Admitting: Adult Health

## 2020-12-21 ENCOUNTER — Telehealth: Payer: BC Managed Care – PPO | Admitting: Internal Medicine

## 2020-12-21 ENCOUNTER — Other Ambulatory Visit (INDEPENDENT_AMBULATORY_CARE_PROVIDER_SITE_OTHER): Payer: Self-pay | Admitting: Adult Health

## 2020-12-21 ENCOUNTER — Encounter: Payer: Self-pay | Admitting: Internal Medicine

## 2020-12-21 VITALS — Temp 98.8°F | Ht 68.0 in | Wt 245.0 lb

## 2020-12-21 DIAGNOSIS — U071 COVID-19: Secondary | ICD-10-CM | POA: Diagnosis not present

## 2020-12-21 DIAGNOSIS — F3289 Other specified depressive episodes: Secondary | ICD-10-CM

## 2020-12-21 MED ORDER — NIRMATRELVIR/RITONAVIR (PAXLOVID) TABLET (RENAL DOSING): 2.0000 | ORAL_TABLET | Freq: Two times a day (BID) | ORAL | 0 refills | Status: AC

## 2020-12-21 NOTE — Progress Notes (Signed)
Virtual Visit via Video Note  I connected withNAME@ on 12/21/20 at  4:30 PM EDT by a video enabled telemedicine application and verified that I am speaking with the correct person using two identifiers. Location patient: home Location provider:work office Persons participating in the virtual visit: patient, provider  [place]th national recommendations  regarding COVID 19 pandemic   video visit is advised over in office visit for this patient.  Patient aware  of the limitations of evaluation and management by telemedicine and  availability of in person appointments. and agreed to proceed.   HPI: Briana Ferguson presents for video visit  Onset  Friday noght stuffy nose and fatigue   took alka eltzer  Aug 26 then headache and some GI   home  tests was positive [place]  Daughter also has it.  ( Is a nurse) .    Husband neg so far .   Better today    still tired and very head congested but no fever  sob  minor cough  . Has beeni mmunized and boosted  moderna     ROS: See pertinent positives and negatives per HPI.  Past Medical History:  Diagnosis Date   Anemia    "as a child"   Anxiety    no per pt   Arthritis    Attention and concentration deficit 2011   had med trial Dr Candis Schatz ? dx    Back pain    Bilateral carpal tunnel syndrome    Diabetes mellitus without complication (HCC)    Dyspnea    Environmental allergies    Cats   GERD (gastroesophageal reflux disease)    IBS (irritable bowel syndrome)    Joint pain    Lactose intolerance    Lower extremity edema    Multiple food allergies    Other fatigue    Prediabetes    Shortness of breath on exertion    Sleep apnea    wears CPAP   Swallowing difficulty    Tubular adenoma of colon    Vitamin D deficiency     Past Surgical History:  Procedure Laterality Date   COLONOSCOPY     NO PAST SURGERIES      Family History  Problem Relation Age of Onset   Diabetes Father    Stroke Father        deceased   Heart disease  Father    Sleep apnea Father    Hypertension Father    Sudden death Father    Hyperlipidemia Father    Diabetes Mother    Stroke Mother    Sleep apnea Mother    Hypertension Mother    Heart disease Mother    Thyroid disease Mother    Obesity Mother    Hyperlipidemia Mother    Kidney disease Mother    Hyperlipidemia Other        fhx   Cancer Other        granfather/liver   Diabetes Maternal Grandmother    Heart disease Maternal Grandmother    Liver cancer Maternal Grandfather    Diabetes Paternal Grandmother    Heart disease Paternal Grandmother    Colon cancer Neg Hx    Stomach cancer Neg Hx    Pancreatic cancer Neg Hx    Esophageal cancer Neg Hx    Rectal cancer Neg Hx     Social History   Tobacco Use   Smoking status: Never   Smokeless tobacco: Never  Vaping Use   Vaping Use:  Never used  Substance Use Topics   Alcohol use: No    Comment: none   Drug use: No      Current Outpatient Medications:    buPROPion (WELLBUTRIN SR) 200 MG 12 hr tablet, Take 1 tablet (200 mg total) by mouth in the morning., Disp: 30 tablet, Rfl: 0   Cholecalciferol (VITAMIN D3) 25 MCG (1000 UT) CAPS, Take 3 capsules by mouth daily., Disp: , Rfl:    clindamycin (CLEOCIN T) 1 % lotion, Apply 1 application topically 2 (two) times daily., Disp: , Rfl:    famotidine (PEPCID) 20 MG tablet, Take 1 tablet (20 mg total) by mouth 2 (two) times daily as needed for heartburn or indigestion., Disp: 60 tablet, Rfl: 1   fluticasone (FLONASE) 50 MCG/ACT nasal spray, 2 sprays each nostril as needed, Disp: 16 g, Rfl: 12   gabapentin (NEURONTIN) 300 MG capsule, Take 1 capsule (300 mg total) by mouth 3 (three) times daily as needed., Disp: 90 capsule, Rfl: 3   MAGNESIUM PO, Take 2,600 mg by mouth as needed., Disp: , Rfl:    Multiple Vitamins-Minerals (CENTRUM SILVER 50+WOMEN PO), Take 1 tablet by mouth daily., Disp: , Rfl:    nirmatrelvir/ritonavir EUA, renal dosing, (PAXLOVID) 10 x 150 MG & 10 x $Re'100MG'ozs$  TABS,  Take 2 tablets by mouth 2 (two) times daily for 5 days. (Take nirmatrelvir 150 mg one tablet twice daily for 5 days and ritonavir 100 mg one tablet twice daily for 5 days) Patient GFR is 54, Disp: 20 tablet, Rfl: 0   predniSONE (DELTASONE) 50 MG tablet, Take 1 tablet (50 mg total) by mouth daily., Disp: 5 tablet, Rfl: 0   spironolactone (ALDACTONE) 100 MG tablet, Take 100 mg by mouth daily as needed., Disp: , Rfl:    tretinoin (RETIN-A) 0.05 % cream, Apply 1 application topically at bedtime., Disp: , Rfl:    vitamin B-12 (CYANOCOBALAMIN) 1000 MCG tablet, Take 1,000 mcg by mouth daily., Disp: , Rfl:    Vitamin D, Ergocalciferol, (DRISDOL) 1.25 MG (50000 UNIT) CAPS capsule, Take 1 capsule (50,000 Units total) by mouth every 7 (seven) days., Disp: 4 capsule, Rfl: 0  EXAM: BP Readings from Last 3 Encounters:  09/29/20 129/77  09/07/20 (!) 145/74  08/03/20 127/84    VITALS per patient if applicable:  GENERAL: alert, oriented, appears well and in no acute distress mild congestion  no sob coggh during  visit   HEENT: atraumatic, conjunttiva clear, no obvious abnormalities on inspection of external nose and ears  NECK: normal movements of the head and neck  LUNGS: on inspection no signs of respiratory distress, breathing rate appears normal, no obvious gross SOB, gasping or wheezing  CV: no obvious cyanosis  MS: moves all visible extremities without noticeable abnormality  PSYCH/NEURO: pleasant and cooperative, no obvious depression or anxiety, speech and thought processing grossly intact Lab Results  Component Value Date   WBC 4.4 06/16/2020   HGB 13.1 06/16/2020   HCT 40.1 06/16/2020   PLT 294 06/16/2020   GLUCOSE 99 06/16/2020   CHOL 174 06/16/2020   TRIG 62 06/16/2020   HDL 77 06/16/2020   LDLCALC 85 06/16/2020   ALT 12 06/16/2020   AST 17 06/16/2020   NA 141 06/16/2020   K 4.3 06/16/2020   CL 102 06/16/2020   CREATININE 1.14 (H) 06/16/2020   BUN 13 06/16/2020   CO2 26  06/16/2020   TSH 2.190 06/16/2020   HGBA1C 6.0 (H) 06/16/2020   Egfr 54  last done  this year  ASSESSMENT AND PLAN:  Discussed the following assessment and plan:    ICD-10-CM   1. COVID-19 virus infection  U07.1      Risk benefit of medication discussed.   Best help is for people high risk unvaccinated and there may be side effects . Will  send in rx  and can decide to take within 5 days of  sx.  Fu with ?s sx   Counseled.   Expectant management and discussion of plan and treatment with opportunity to ask questions and all were answered. The patient agreed with the plan and demonstrated an understanding of the instructions.  review  counsel and plan 30 minutes  Advised to call back or seek an in-person evaluation if worsening  or having  further concerns . Return if symptoms worsen or fail to improve as expected.   Shanon Ace, MD

## 2020-12-24 ENCOUNTER — Telehealth: Payer: BC Managed Care – PPO | Admitting: Internal Medicine

## 2021-01-26 ENCOUNTER — Ambulatory Visit (INDEPENDENT_AMBULATORY_CARE_PROVIDER_SITE_OTHER): Payer: BC Managed Care – PPO | Admitting: Adult Health

## 2021-01-26 ENCOUNTER — Other Ambulatory Visit: Payer: Self-pay

## 2021-01-26 ENCOUNTER — Encounter (INDEPENDENT_AMBULATORY_CARE_PROVIDER_SITE_OTHER): Payer: Self-pay | Admitting: Adult Health

## 2021-01-26 VITALS — BP 145/75 | HR 68 | Temp 98.4°F | Ht 68.0 in | Wt 242.0 lb

## 2021-01-26 DIAGNOSIS — F3289 Other specified depressive episodes: Secondary | ICD-10-CM

## 2021-01-26 DIAGNOSIS — E119 Type 2 diabetes mellitus without complications: Secondary | ICD-10-CM | POA: Diagnosis not present

## 2021-01-26 DIAGNOSIS — Z9189 Other specified personal risk factors, not elsewhere classified: Secondary | ICD-10-CM

## 2021-01-26 DIAGNOSIS — E559 Vitamin D deficiency, unspecified: Secondary | ICD-10-CM | POA: Diagnosis not present

## 2021-01-26 DIAGNOSIS — Z6839 Body mass index (BMI) 39.0-39.9, adult: Secondary | ICD-10-CM

## 2021-01-26 DIAGNOSIS — N1831 Chronic kidney disease, stage 3a: Secondary | ICD-10-CM

## 2021-01-26 MED ORDER — BUPROPION HCL ER (SR) 200 MG PO TB12: 200.0000 mg | ORAL_TABLET | Freq: Every morning | ORAL | 0 refills | Status: DC

## 2021-01-26 NOTE — Progress Notes (Signed)
Chief Complaint:   OBESITY Briana Ferguson is here to discuss her progress with her obesity treatment plan along with follow-up of her obesity related diagnoses. Briana Ferguson is on the Category 2 Plan and states she is following her eating plan approximately 50% of the time. Briana Ferguson states she is not exercising regularly.  Today's visit was #: 7 Starting weight: 242 lbs Starting date: 06/16/2020 Today's weight: 242 lbs Today's date: 01/26/2021 Total lbs lost to date: 0 Total lbs lost since last in-office visit: 3 lbs  Interim History:  Briana Ferguson states "lack of discipline" when she eats off plan 50% of the time.   She will utilize fast food ([employer], [employer], [employer], [employer]) when traveling for work. Her job requires frequent/daily travel.  Subjective:   1. Type 2 diabetes mellitus without complication, without long-term current use of insulin (HCC) Previously on metformin- not currently on any blood glucose lowering medications. She denies family history of MTC or personal history of pancreatitis. Dicussed risks/benefits or GLP-1 and GIP/GLP-1 therapy.  2. Vitamin D deficiency She is on OTC vitamin D3 1,000 IU - 2 capsules per day. She has been off ergocalciferol since May 2022.   3. Stage 3a chronic kidney disease ([place]) She has been using NSAIDs - several doses per month for diffuse pain.  4. Other depression with emotional eating She is on bupropion SR 200 mg daily - been off for months due to lapse in regular office visits. She denies hx of seizures.    5. At risk for hyperglycemia Briana Ferguson is at risk for hyperglycemia due to T2D.  Assessment/Plan:   1. Type 2 diabetes mellitus without complication, without long-term current use of insulin ([place]) Check labs today. Consider initiating GLP-1 or GIP/GLP-1 therapy at f/u.  - Hemoglobin A1c - Insulin, random  2. Vitamin D deficiency Check vitamin D level today.  Refill ergocalciferol as appropriate.  - VITAMIN D 25 Hydroxy (Vit-D  Deficiency, Fractures)  3. Stage 3a chronic kidney disease (HCC) Avoid NSAIDs - acetaminophen safer. Check labs today.  - Comprehensive metabolic panel  4. Other depression with emotional eating Refill/restart bupropion SR 200 mg daily, as per below.  - Refill/restart buPROPion (WELLBUTRIN SR) 200 MG 12 hr tablet; Take 1 tablet (200 mg total) by mouth in the morning.  Dispense: 30 tablet; Refill: 0  5. At risk for hyperglycemia Ardelle was given approximately 15 minutes of counseling today regarding prevention of hyperglycemia. She was advised of hyperglycemia causes and the fact hyperglycemia is often asymptomatic. Lamees was instructed to avoid skipping meals, eat regular protein rich meals and schedule low calorie but protein rich snacks as needed.   Repetitive spaced learning was employed today to elicit superior memory formation and behavioral change   6. Obesity with current BMI 36.9  Briana Ferguson is currently in the action stage of change. As such, her goal is to continue with weight loss efforts. She has agreed to the Category 2 Plan with 300-400 calories and 30+ grams of protein at lunch and 400-500 calories and 35+ grams of protein at dinner.  Use cooler bag to pack foods on plan.   Exercise goals:  As is.  Behavioral modification strategies: increasing lean protein intake, decreasing simple carbohydrates, decreasing eating out, no skipping meals, meal planning and cooking strategies, keeping healthy foods in the home, and planning for success.  Briana Ferguson has agreed to follow-up with our clinic in 3 weeks. She was informed of the importance of frequent follow-up visits to maximize her success with  intensive lifestyle modifications for her multiple health conditions.   Briana Ferguson was informed we would discuss her lab results at her next visit unless there is a critical issue that needs to be addressed sooner. Briana Ferguson agreed to keep her next visit at the agreed upon time to discuss these  results.  Objective:   Blood pressure (!) 145/75, pulse 68, temperature 98.4 F (36.9 C), height 5\' 8"  (1.727 m), weight 242 lb (109.8 kg), last menstrual period 04/03/2011, SpO2 98 %. Body mass index is 36.8 kg/m.  General: Cooperative, alert, well developed, in no acute distress. HEENT: Conjunctivae and lids unremarkable. Cardiovascular: Regular rhythm.  Lungs: Normal work of breathing. Neurologic: No focal deficits.   Lab Results  Component Value Date   CREATININE 1.14 (H) 06/16/2020   BUN 13 06/16/2020   NA 141 06/16/2020   K 4.3 06/16/2020   CL 102 06/16/2020   CO2 26 06/16/2020   Lab Results  Component Value Date   ALT 12 06/16/2020   AST 17 06/16/2020   ALKPHOS 87 06/16/2020   BILITOT 0.4 06/16/2020   Lab Results  Component Value Date   HGBA1C 6.0 (H) 06/16/2020   HGBA1C 5.9 (H) 08/27/2019   HGBA1C 6.2 (H) 05/07/2019   HGBA1C 6.5 03/19/2019   Lab Results  Component Value Date   INSULIN 14.1 06/16/2020   INSULIN 18.9 05/07/2019   Lab Results  Component Value Date   TSH 2.190 06/16/2020   Lab Results  Component Value Date   CHOL 174 06/16/2020   HDL 77 06/16/2020   LDLCALC 85 06/16/2020   TRIG 62 06/16/2020   CHOLHDL 3 03/19/2019   Lab Results  Component Value Date   VD25OH 44.4 06/16/2020   VD25OH 69.0 08/27/2019   VD25OH 37.2 05/07/2019   Lab Results  Component Value Date   WBC 4.4 06/16/2020   HGB 13.1 06/16/2020   HCT 40.1 06/16/2020   MCV 87 06/16/2020   PLT 294 06/16/2020   Attestation Statements:   Reviewed by clinician on day of visit: allergies, medications, problem list, medical history, surgical history, family history, social history, and previous encounter notes.  I, Water quality scientist, CMA, am acting as Location manager for Mina Marble, NP.  I have reviewed the above documentation for accuracy and completeness, and I agree with the above. -  Hevin Jeffcoat d. Hula Tasso, NP-C

## 2021-01-27 ENCOUNTER — Encounter (INDEPENDENT_AMBULATORY_CARE_PROVIDER_SITE_OTHER): Payer: Self-pay | Admitting: Adult Health

## 2021-01-27 ENCOUNTER — Other Ambulatory Visit (INDEPENDENT_AMBULATORY_CARE_PROVIDER_SITE_OTHER): Payer: Self-pay | Admitting: Adult Health

## 2021-01-27 DIAGNOSIS — E559 Vitamin D deficiency, unspecified: Secondary | ICD-10-CM

## 2021-01-27 LAB — COMPREHENSIVE METABOLIC PANEL
ALT: 13 IU/L (ref 0–32)
AST: 16 IU/L (ref 0–40)
Albumin/Globulin Ratio: 1.5 (ref 1.2–2.2)
Albumin: 4.6 g/dL (ref 3.8–4.9)
Alkaline Phosphatase: 84 IU/L (ref 44–121)
BUN/Creatinine Ratio: 12 (ref 9–23)
BUN: 12 mg/dL (ref 6–24)
Bilirubin Total: 0.5 mg/dL (ref 0.0–1.2)
CO2: 25 mmol/L (ref 20–29)
Calcium: 9.4 mg/dL (ref 8.7–10.2)
Chloride: 101 mmol/L (ref 96–106)
Creatinine, Ser: 0.98 mg/dL (ref 0.57–1.00)
Globulin, Total: 3.1 g/dL (ref 1.5–4.5)
Glucose: 104 mg/dL — ABNORMAL HIGH (ref 70–99)
Potassium: 4.2 mmol/L (ref 3.5–5.2)
Sodium: 139 mmol/L (ref 134–144)
Total Protein: 7.7 g/dL (ref 6.0–8.5)
eGFR: 67 mL/min/{1.73_m2} (ref >59)

## 2021-01-27 LAB — INSULIN, RANDOM: INSULIN: 13.9 u[IU]/mL (ref 2.6–24.9)

## 2021-01-27 LAB — VITAMIN D 25 HYDROXY (VIT D DEFICIENCY, FRACTURES): Vit D, 25-Hydroxy: 44.8 ng/mL (ref 30.0–100.0)

## 2021-01-27 LAB — HEMOGLOBIN A1C
Est. average glucose Bld gHb Est-mCnc: 128 mg/dL
Hgb A1c MFr Bld: 6.1 % — ABNORMAL HIGH (ref 4.8–5.6)

## 2021-01-27 MED ORDER — VITAMIN D (ERGOCALCIFEROL) 1.25 MG (50000 UNIT) PO CAPS: 50000.0000 [IU] | ORAL_CAPSULE | ORAL | 0 refills | Status: DC

## 2021-02-05 MED ORDER — FLUTICASONE PROPIONATE 50 MCG/ACT NA SUSP: NASAL | 0 refills | Status: AC

## 2021-02-15 ENCOUNTER — Encounter (INDEPENDENT_AMBULATORY_CARE_PROVIDER_SITE_OTHER): Payer: Self-pay | Admitting: Adult Health

## 2021-02-15 ENCOUNTER — Other Ambulatory Visit: Payer: Self-pay

## 2021-02-15 ENCOUNTER — Ambulatory Visit (INDEPENDENT_AMBULATORY_CARE_PROVIDER_SITE_OTHER): Payer: BC Managed Care – PPO | Admitting: Adult Health

## 2021-02-15 VITALS — BP 136/83 | HR 75 | Temp 98.4°F | Ht 68.0 in | Wt 248.0 lb

## 2021-02-15 DIAGNOSIS — E119 Type 2 diabetes mellitus without complications: Secondary | ICD-10-CM

## 2021-02-15 DIAGNOSIS — Z6839 Body mass index (BMI) 39.0-39.9, adult: Secondary | ICD-10-CM | POA: Diagnosis not present

## 2021-02-15 DIAGNOSIS — Z9189 Other specified personal risk factors, not elsewhere classified: Secondary | ICD-10-CM | POA: Diagnosis not present

## 2021-02-15 DIAGNOSIS — E559 Vitamin D deficiency, unspecified: Secondary | ICD-10-CM

## 2021-02-15 MED ORDER — VITAMIN D (ERGOCALCIFEROL) 1.25 MG (50000 UNIT) PO CAPS: 50000.0000 [IU] | ORAL_CAPSULE | ORAL | 0 refills | Status: DC

## 2021-02-16 NOTE — Progress Notes (Signed)
Chief Complaint:   OBESITY Briana Ferguson is here to discuss her progress with her obesity treatment plan along with follow-up of her obesity related diagnoses. Briana Ferguson is on the Category 2 Plan and keeping a food journal and adhering to recommended goals of 300-400 calories and 30+ grams of protein at lunch and 400-500 calories and 35+ grams of protein at dinner and states she is following her eating plan approximately 50% of the time. Briana Ferguson states she was walking on vacation.  Today's visit was #: 8 Starting weight: 242 lbs Starting date: 06/16/2020 Today's weight: 248 lbs Today's date: 02/15/2021 Total lbs lost to date: 0  Total lbs lost since last in-office visit: 0  Interim History: Briana Ferguson has been on a 7 day [place] to [place], [place], [place], and [place]. She says she focused on protein and vegetables at each meal while on her vacation. She did not track steps, however she estimates to have walked >10K steps/day while on the cruise.  Subjective:   1. Vitamin D deficiency Discussed labs with patient today.  On 01/26/2021, vitamin D level - 44.8 - below goal of 50. She is currently taking prescription ergocalciferol 50,000 IU each week.  She is also on OTC vitamin D3 2,000 IU daily. She denies nausea, vomiting or muscle weakness.   2. Type 2 diabetes mellitus without complication, without long-term current use of insulin ([place]) Discussed labs with patient today.  01/26/2021, BG 104, A1c 6.1, insulin level 13.9. She was briefly on metformin in 2021. She denies current polyphagia, often has to make herself eat due to low hunger levels.   Significant family history of T2D- both parents and siblings.  3. At risk for hyperglycemia Briana Ferguson is at risk for hyperglycemia due to having type 2 diabetes and not on any BG lowering medication.  Assessment/Plan:   1. Vitamin D deficiency Refill ergocalciferol 50,000 IU once weekly, as per below.  - Refill Vitamin D,  Ergocalciferol, (DRISDOL) 1.25 MG (50000 UNIT) CAPS capsule; Take 1 capsule (50,000 Units total) by mouth every 7 (seven) days.  Dispense: 4 capsule; Refill: 0  2. Type 2 diabetes mellitus without complication, without long-term current use of insulin (HCC) Decrease carbohydrates and increase protein intake.   Increase daily walking.  3. At risk for hyperglycemia Briana Ferguson was given approximately 15 minutes of counseling today regarding prevention of hyperglycemia. She was advised of hyperglycemia causes and the fact hyperglycemia is often asymptomatic. Briana Ferguson was instructed to avoid skipping meals, eat regular protein rich meals and schedule low calorie but protein rich snacks as needed.   Repetitive spaced learning was employed today to elicit superior memory formation and behavioral change   4. Obesity with current BMI 37.7  Talecia is currently in the action stage of change. As such, her goal is to continue with weight loss efforts. She has agreed to the Category 2 Plan.   Exercise goals:  Walking daily.  Behavioral modification strategies: increasing lean protein intake, decreasing simple carbohydrates, meal planning and cooking strategies, keeping healthy foods in the home, and planning for success.  Briana Ferguson has agreed to follow-up with our clinic in 2 weeks. She was informed of the importance of frequent follow-up visits to maximize her success with intensive lifestyle modifications for her multiple health conditions.   Objective:   Blood pressure 136/83, pulse 75, temperature 98.4 F (36.9 C), height 5\' 8"  (1.727 m), weight 248 lb (112.5 kg), last menstrual period 04/03/2011, SpO2 99 %. Body mass  index is 37.71 kg/m.  General: Cooperative, alert, well developed, in no acute distress. HEENT: Conjunctivae and lids unremarkable. Cardiovascular: Regular rhythm.  Lungs: Normal work of breathing. Neurologic: No focal deficits.   Lab Results  Component Value Date   CREATININE 0.98  01/26/2021   BUN 12 01/26/2021   NA 139 01/26/2021   K 4.2 01/26/2021   CL 101 01/26/2021   CO2 25 01/26/2021   Lab Results  Component Value Date   ALT 13 01/26/2021   AST 16 01/26/2021   ALKPHOS 84 01/26/2021   BILITOT 0.5 01/26/2021   Lab Results  Component Value Date   HGBA1C 6.1 (H) 01/26/2021   HGBA1C 6.0 (H) 06/16/2020   HGBA1C 5.9 (H) 08/27/2019   HGBA1C 6.2 (H) 05/07/2019   HGBA1C 6.5 03/19/2019   Lab Results  Component Value Date   INSULIN 13.9 01/26/2021   INSULIN 14.1 06/16/2020   INSULIN 18.9 05/07/2019   Lab Results  Component Value Date   TSH 2.190 06/16/2020   Lab Results  Component Value Date   CHOL 174 06/16/2020   HDL 77 06/16/2020   LDLCALC 85 06/16/2020   TRIG 62 06/16/2020   CHOLHDL 3 03/19/2019   Lab Results  Component Value Date   VD25OH 44.8 01/26/2021   VD25OH 44.4 06/16/2020   VD25OH 69.0 08/27/2019   Lab Results  Component Value Date   WBC 4.4 06/16/2020   HGB 13.1 06/16/2020   HCT 40.1 06/16/2020   MCV 87 06/16/2020   PLT 294 06/16/2020   Attestation Statements:   Reviewed by clinician on day of visit: allergies, medications, problem list, medical history, surgical history, family history, social history, and previous encounter notes.  I, Water quality scientist, CMA, am acting as Location manager for Mina Marble, NP.  I have reviewed the above documentation for accuracy and completeness, and I agree with the above. -  Aydin Hink d. Bobbie Valletta, NP-C

## 2021-02-25 ENCOUNTER — Encounter (INDEPENDENT_AMBULATORY_CARE_PROVIDER_SITE_OTHER): Payer: Self-pay

## 2021-03-01 ENCOUNTER — Ambulatory Visit (INDEPENDENT_AMBULATORY_CARE_PROVIDER_SITE_OTHER): Payer: BC Managed Care – PPO | Admitting: Adult Health

## 2021-03-08 ENCOUNTER — Encounter (INDEPENDENT_AMBULATORY_CARE_PROVIDER_SITE_OTHER): Payer: Self-pay

## 2021-03-09 ENCOUNTER — Ambulatory Visit (INDEPENDENT_AMBULATORY_CARE_PROVIDER_SITE_OTHER): Payer: BC Managed Care – PPO | Admitting: Bariatrics

## 2021-03-23 ENCOUNTER — Other Ambulatory Visit: Payer: Self-pay

## 2021-03-23 ENCOUNTER — Encounter (INDEPENDENT_AMBULATORY_CARE_PROVIDER_SITE_OTHER): Payer: Self-pay | Admitting: Family Medicine

## 2021-03-23 ENCOUNTER — Ambulatory Visit (INDEPENDENT_AMBULATORY_CARE_PROVIDER_SITE_OTHER): Payer: BC Managed Care – PPO | Admitting: Family Medicine

## 2021-03-23 VITALS — BP 126/76 | HR 72 | Temp 98.2°F | Ht 68.0 in | Wt 246.0 lb

## 2021-03-23 DIAGNOSIS — F3289 Other specified depressive episodes: Secondary | ICD-10-CM | POA: Diagnosis not present

## 2021-03-23 DIAGNOSIS — Z6839 Body mass index (BMI) 39.0-39.9, adult: Secondary | ICD-10-CM

## 2021-03-23 DIAGNOSIS — Z9189 Other specified personal risk factors, not elsewhere classified: Secondary | ICD-10-CM | POA: Diagnosis not present

## 2021-03-23 DIAGNOSIS — E559 Vitamin D deficiency, unspecified: Secondary | ICD-10-CM

## 2021-03-23 MED ORDER — BUPROPION HCL ER (SR) 200 MG PO TB12: 200.0000 mg | ORAL_TABLET | Freq: Every morning | ORAL | 0 refills | Status: DC

## 2021-03-23 MED ORDER — VITAMIN D (ERGOCALCIFEROL) 1.25 MG (50000 UNIT) PO CAPS: 50000.0000 [IU] | ORAL_CAPSULE | ORAL | 0 refills | Status: DC

## 2021-03-24 NOTE — Progress Notes (Signed)
Chief Complaint:   OBESITY Briana Ferguson is here to discuss her progress with her obesity treatment plan along with follow-up of her obesity related diagnoses. Briana Ferguson is on the Category 2 Plan and states she is following her eating plan approximately 30% of the time. Briana Ferguson states she is doing minor walking.   Today's visit was #: 9 Starting weight: 242 lbs Starting date: 06/16/2020 Today's weight: 246 lbs Today's date: 03/23/2021 Total lbs lost to date: 0 Total lbs lost since last in-office visit: 2  Interim History: Briana Ferguson has done a lot of celebration eating in the last month. She also increased walking more on a cruise. She is ready to get back to her structured plan.  Subjective:   1. Vitamin D deficiency Briana Ferguson is stable on Vit D, and she denies nausea, vomiting, or muscle weakness.  2. Other depression with emotional eating Briana Ferguson has done well on her medications, and she is working on decreasing emotional eating behavior. No side effects were noted.  3. At risk for heart disease Briana Ferguson is at a higher than average risk for cardiovascular disease due to obesity.   Assessment/Plan:   1. Vitamin D deficiency We will refill prescription Vitamin D 50,000 IU every week for 1 month. Briana Ferguson will follow-up for routine testing of Vitamin D, at least 2-3 times per year to avoid over-replacement.  - Vitamin D, Ergocalciferol, (DRISDOL) 1.25 MG (50000 UNIT) CAPS capsule; Take 1 capsule (50,000 Units total) by mouth every 7 (seven) days.  Dispense: 4 capsule; Refill: 0  2. Other depression with emotional eating Behavior modification techniques were discussed today to help Briana Ferguson deal with her emotional/non-hunger eating behaviors. We will refill Wellbutrin SR for 1 month. Orders and follow up as documented in patient record.   - buPROPion (WELLBUTRIN SR) 200 MG 12 hr tablet; Take 1 tablet (200 mg total) by mouth in the morning.  Dispense: 30 tablet; Refill: 0  3. At risk for heart  disease Briana Ferguson was given approximately 15 minutes of coronary artery disease prevention counseling today. She is 60 y.o. female and has risk factors for heart disease including obesity. We discussed intensive lifestyle modifications today with an emphasis on specific weight loss instructions and strategies.   Repetitive spaced learning was employed today to elicit superior memory formation and behavioral change.  4. Obesity BMI today is 30 Briana Ferguson is currently in the action stage of change. As such, her goal is to continue with weight loss efforts. She has agreed to the Category 2 Plan.   Exercise goals: 10 minutes per day of cardio.  Behavioral modification strategies: increasing lean protein intake.  Briana Ferguson has agreed to follow-up with our clinic in 3 weeks. She was informed of the importance of frequent follow-up visits to maximize her success with intensive lifestyle modifications for her multiple health conditions.   Objective:   Blood pressure 126/76, pulse 72, temperature 98.2 F (36.8 C), temperature source Oral, height 5\' 8"  (1.727 m), weight 246 lb (111.6 kg), last menstrual period 04/03/2011, SpO2 99 %. Body mass index is 37.4 kg/m.  General: Cooperative, alert, well developed, in no acute distress. HEENT: Conjunctivae and lids unremarkable. Cardiovascular: Regular rhythm.  Lungs: Normal work of breathing. Neurologic: No focal deficits.   Lab Results  Component Value Date   CREATININE 0.98 01/26/2021   BUN 12 01/26/2021   NA 139 01/26/2021   K 4.2 01/26/2021   CL 101 01/26/2021   CO2 25 01/26/2021   Lab Results  Component Value  Date   ALT 13 01/26/2021   AST 16 01/26/2021   ALKPHOS 84 01/26/2021   BILITOT 0.5 01/26/2021   Lab Results  Component Value Date   HGBA1C 6.1 (H) 01/26/2021   HGBA1C 6.0 (H) 06/16/2020   HGBA1C 5.9 (H) 08/27/2019   HGBA1C 6.2 (H) 05/07/2019   HGBA1C 6.5 03/19/2019   Lab Results  Component Value Date   INSULIN 13.9 01/26/2021    INSULIN 14.1 06/16/2020   INSULIN 18.9 05/07/2019   Lab Results  Component Value Date   TSH 2.190 06/16/2020   Lab Results  Component Value Date   CHOL 174 06/16/2020   HDL 77 06/16/2020   LDLCALC 85 06/16/2020   TRIG 62 06/16/2020   CHOLHDL 3 03/19/2019   Lab Results  Component Value Date   VD25OH 44.8 01/26/2021   VD25OH 44.4 06/16/2020   VD25OH 69.0 08/27/2019   Lab Results  Component Value Date   WBC 4.4 06/16/2020   HGB 13.1 06/16/2020   HCT 40.1 06/16/2020   MCV 87 06/16/2020   PLT 294 06/16/2020   No results found for: IRON, TIBC, FERRITIN  Attestation Statements:   Reviewed by clinician on day of visit: allergies, medications, problem list, medical history, surgical history, family history, social history, and previous encounter notes.   I, Trixie Dredge, am acting as transcriptionist for Dennard Nip, MD.  I have reviewed the above documentation for accuracy and completeness, and I agree with the above. -  Dennard Nip, MD

## 2021-04-12 ENCOUNTER — Encounter (INDEPENDENT_AMBULATORY_CARE_PROVIDER_SITE_OTHER): Payer: Self-pay | Admitting: Family Medicine

## 2021-04-12 ENCOUNTER — Ambulatory Visit (INDEPENDENT_AMBULATORY_CARE_PROVIDER_SITE_OTHER): Payer: BC Managed Care – PPO | Admitting: Family Medicine

## 2021-04-12 ENCOUNTER — Other Ambulatory Visit: Payer: Self-pay

## 2021-04-12 VITALS — BP 130/76 | HR 63 | Temp 98.2°F | Ht 68.0 in | Wt 246.0 lb

## 2021-04-12 DIAGNOSIS — E559 Vitamin D deficiency, unspecified: Secondary | ICD-10-CM

## 2021-04-12 DIAGNOSIS — Z9189 Other specified personal risk factors, not elsewhere classified: Secondary | ICD-10-CM

## 2021-04-12 DIAGNOSIS — Z6839 Body mass index (BMI) 39.0-39.9, adult: Secondary | ICD-10-CM | POA: Diagnosis not present

## 2021-04-12 MED ORDER — VITAMIN D (ERGOCALCIFEROL) 1.25 MG (50000 UNIT) PO CAPS
50000.0000 [IU] | ORAL_CAPSULE | ORAL | 0 refills | Status: DC
Start: 2021-04-12 — End: 2021-06-21

## 2021-04-13 NOTE — Progress Notes (Signed)
Chief Complaint:   OBESITY Briana Ferguson is here to discuss her progress with her obesity treatment plan along with follow-up of her obesity related diagnoses. Briana Ferguson is on the Category 2 Plan and states she is following her eating plan approximately 50-60% of the time. Briana Ferguson states she is on the elliptical on Saturday for 45 minutes.   Today's visit was #: 10 Starting weight: 242 lbs Starting date: 06/16/2020 Today's weight: 246 lbs Today's date: 04/12/2021 Total lbs lost to date: 0 Total lbs lost since last in-office visit: 0  Interim History: Briana Ferguson has done well with maintaining her weight and avoiding holiday weight gain. She is making strategies now to avoid Christmas weight gain. She has started working on increasing exercise.  Subjective:   1. Vitamin D deficiency Briana Ferguson is stable on Vit D with no side effects noted. Her level is not yet at goal.  2. At risk for heart disease Briana Ferguson is at a higher than average risk for cardiovascular disease due to obesity.   Assessment/Plan:   1. Vitamin D deficiency We will refill prescription Vitamin D for 1 month. [name] will follow-up for routine testing of Vitamin D, at least 2-3 times per year to avoid over-replacement.  - Vitamin D, Ergocalciferol, (DRISDOL) 1.25 MG (50000 UNIT) CAPS capsule; Take 1 capsule (50,000 Units total) by mouth every 7 (seven) days.  Dispense: 4 capsule; Refill: 0  2. At risk for heart disease Briana Ferguson was given approximately 15 minutes of coronary artery disease prevention counseling today. She is 60 y.o. female and has risk factors for heart disease including obesity. We discussed intensive lifestyle modifications today with an emphasis on specific weight loss instructions and strategies.   Repetitive spaced learning was employed today to elicit superior memory formation and behavioral change.  3. Obesity BMI today is 37.5 Briana Ferguson is currently in the action stage of change. As such, her goal is to continue with  weight loss efforts. She has agreed to the Category 2 Plan.   Exercise goals: Make exercise goals for next month.  Behavioral modification strategies: increasing lean protein intake and holiday eating strategies .  Briana Ferguson has agreed to follow-up with our clinic in 3 weeks. She was informed of the importance of frequent follow-up visits to maximize her success with intensive lifestyle modifications for her multiple health conditions.   Objective:   Blood pressure 130/76, pulse 63, temperature 98.2 F (36.8 C), height 5\' 8"  (1.727 m), weight 246 lb (111.6 kg), last menstrual period 04/03/2011, SpO2 100 %. Body mass index is 37.4 kg/m.  General: Cooperative, alert, well developed, in no acute distress. HEENT: Conjunctivae and lids unremarkable. Cardiovascular: Regular rhythm.  Lungs: Normal work of breathing. Neurologic: No focal deficits.   Lab Results  Component Value Date   CREATININE 0.98 01/26/2021   BUN 12 01/26/2021   NA 139 01/26/2021   K 4.2 01/26/2021   CL 101 01/26/2021   CO2 25 01/26/2021   Lab Results  Component Value Date   ALT 13 01/26/2021   AST 16 01/26/2021   ALKPHOS 84 01/26/2021   BILITOT 0.5 01/26/2021   Lab Results  Component Value Date   HGBA1C 6.1 (H) 01/26/2021   HGBA1C 6.0 (H) 06/16/2020   HGBA1C 5.9 (H) 08/27/2019   HGBA1C 6.2 (H) 05/07/2019   HGBA1C 6.5 03/19/2019   Lab Results  Component Value Date   INSULIN 13.9 01/26/2021   INSULIN 14.1 06/16/2020   INSULIN 18.9 05/07/2019   Lab Results  Component Value  Date   TSH 2.190 06/16/2020   Lab Results  Component Value Date   CHOL 174 06/16/2020   HDL 77 06/16/2020   LDLCALC 85 06/16/2020   TRIG 62 06/16/2020   CHOLHDL 3 03/19/2019   Lab Results  Component Value Date   VD25OH 44.8 01/26/2021   VD25OH 44.4 06/16/2020   VD25OH 69.0 08/27/2019   Lab Results  Component Value Date   WBC 4.4 06/16/2020   HGB 13.1 06/16/2020   HCT 40.1 06/16/2020   MCV 87 06/16/2020   PLT 294  06/16/2020   No results found for: IRON, TIBC, FERRITIN  Attestation Statements:   Reviewed by clinician on day of visit: allergies, medications, problem list, medical history, surgical history, family history, social history, and previous encounter notes.   I, Trixie Dredge, am acting as transcriptionist for Dennard Nip, MD.  I have reviewed the above documentation for accuracy and completeness, and I agree with the above. -  Dennard Nip, MD

## 2021-04-30 ENCOUNTER — Encounter (INDEPENDENT_AMBULATORY_CARE_PROVIDER_SITE_OTHER): Payer: Self-pay | Admitting: Physician Assistant

## 2021-05-05 ENCOUNTER — Ambulatory Visit (INDEPENDENT_AMBULATORY_CARE_PROVIDER_SITE_OTHER): Payer: BC Managed Care – PPO | Admitting: Physician Assistant

## 2021-05-10 ENCOUNTER — Other Ambulatory Visit: Payer: Self-pay

## 2021-05-10 ENCOUNTER — Ambulatory Visit (INDEPENDENT_AMBULATORY_CARE_PROVIDER_SITE_OTHER): Payer: BC Managed Care – PPO | Admitting: Nurse Practitioner

## 2021-05-10 ENCOUNTER — Encounter: Payer: Self-pay | Admitting: Nurse Practitioner

## 2021-05-10 VITALS — BP 135/76 | HR 63 | Temp 98.5°F | Ht 68.0 in | Wt 254.7 lb

## 2021-05-10 DIAGNOSIS — Z7689 Persons encountering health services in other specified circumstances: Secondary | ICD-10-CM

## 2021-05-10 DIAGNOSIS — R5383 Other fatigue: Secondary | ICD-10-CM

## 2021-05-10 DIAGNOSIS — Z9189 Other specified personal risk factors, not elsewhere classified: Secondary | ICD-10-CM | POA: Diagnosis not present

## 2021-05-10 DIAGNOSIS — Z6838 Body mass index (BMI) 38.0-38.9, adult: Secondary | ICD-10-CM | POA: Diagnosis not present

## 2021-05-10 NOTE — Progress Notes (Signed)
New Patient Office Visit  Subjective:  Patient ID: Briana Ferguson, female    DOB: 12/19/1960  Age: 61 y.o. MRN: [id number]  CC:  Chief Complaint  Patient presents with   New Patient (Initial Visit)    HPI Briana Ferguson presents to establish new primary care provider. She is coming from another practice in town which is much further away.  The patient does go to [facility]. Last, full set of labs was done 05/2020. She does see [facility] for well woman care and mammograms.  The patient is prediabetic. Last check of HgbA1c was 6.1. she is managing this through diet and exercise. The patient states that she has lost  18 pounds since starting to see providers at [facility].  The patient states that she is having increased severity of carpal tunnel symptoms in both hands. She has been diagnosed with this in the past. She also has increased lower back pain. She has been diagnosed with degenerative disc disease about 20 years ago. States that symptoms are most severe in the mornings. She states that her back hurts more with the longer time she sits at her work. She has done some physical therapy in 2022. She was given some exercises to do to help keep herself mobile. She does not take medication to relieve the pain. She knows that increasing exercise is something she knows she needs to do.  She states that exercise is hard to get interested in.  She has no other concerns or complaints today.  Past Medical History:  Diagnosis Date   Anemia    "as a child"   Anxiety    no per pt   Arthritis    Attention and concentration deficit 2011   had med trial Dr Candis Schatz ? dx    Back pain    Bilateral carpal tunnel syndrome    Diabetes mellitus without complication ([facility])    Dyspnea    Environmental allergies    Cats   GERD (gastroesophageal reflux disease)    IBS (irritable bowel syndrome)    Joint pain    Lactose  intolerance    Lower extremity edema    Multiple food allergies    Other fatigue    Prediabetes    Shortness of breath on exertion    Sleep apnea    wears CPAP   Swallowing difficulty    Tubular adenoma of colon    Vitamin D deficiency     Past Surgical History:  Procedure Laterality Date   COLONOSCOPY     NO PAST SURGERIES      Family History  Problem Relation Age of Onset   Diabetes Father    Stroke Father        deceased   Heart disease Father    Sleep apnea Father    Hypertension Father    Sudden death Father    Hyperlipidemia Father    Diabetes Mother    Stroke Mother    Sleep apnea Mother    Hypertension Mother    Heart disease Mother    Thyroid disease Mother    Obesity Mother    Hyperlipidemia Mother    Kidney disease Mother    Hyperlipidemia Other        fhx   Cancer Other        granfather/liver   Diabetes Maternal Grandmother    Heart disease Maternal Grandmother    Liver cancer Maternal Grandfather  Diabetes Paternal Grandmother    Heart disease Paternal Grandmother    Colon cancer Neg Hx    Stomach cancer Neg Hx    Pancreatic cancer Neg Hx    Esophageal cancer Neg Hx    Rectal cancer Neg Hx     Social History   Socioeconomic History   Marital status: Married    Spouse name: Myria Steenbergen   Number of children: Not on file   Years of education: Not on file   Highest education level: Not on file  Occupational History   Occupation: Scientist, research (life sciences)    Employer: [employer]  Tobacco Use   Smoking status: Never   Smokeless tobacco: Never  Vaping Use   Vaping Use: Never used  Substance and Sexual Activity   Alcohol use: No    Comment: none   Drug use: No   Sexual activity: Yes    Partners: Male  Other Topics Concern   Not on file  Social History Narrative   [name] degree  May 2011.  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  Now   45 hours per week from home 2019   Married    [place]   Of.   4  Daughter  husband and m in Radiation protection practitioner  Outside pet.      Has smoke detector and wears seat belts.  No firearms. Stored safely .Sees dentist regularly . No depression       CB x 3    Husband with vascectomy            Social Determinants of Health   Financial Resource Strain: Not on file  Food Insecurity: Not on file  Transportation Needs: Not on file  Physical Activity: Not on file  Stress: Not on file  Social Connections: Not on file  Intimate Partner Violence: Not on file    ROS Review of Systems  Constitutional:  Negative for activity change, appetite change, chills, fatigue and fever.  HENT:  Negative for congestion, postnasal drip, rhinorrhea, sinus pressure, sinus pain, sneezing and sore throat.   Eyes: Negative.   Respiratory:  Negative for cough, chest tightness, shortness of breath and wheezing.   Cardiovascular:  Negative for chest pain and palpitations.  Gastrointestinal:  Negative for abdominal pain, constipation, diarrhea, nausea and vomiting.  Endocrine: Negative for cold intolerance, heat intolerance, polydipsia and polyuria.  Genitourinary:  Negative for dyspareunia, dysuria, flank pain, frequency and urgency.  Musculoskeletal:  Positive for arthralgias and back pain. Negative for myalgias.       The patient has pain in both wrists and hands and also has low back pain. Does have diagnosis of bilateral carpal tunnel syndrome. Was also told that she had mild degenerative disc disease in her lumbar spine about 20 years ago.   Skin:  Negative for rash.  Allergic/Immunologic: Negative for environmental allergies.  Neurological:  Negative for dizziness, weakness and headaches.  Hematological:  Negative for adenopathy.  Psychiatric/Behavioral:  The patient is not nervous/anxious.    Objective:   Today's Vitals   05/10/21 1112  BP: 135/76  Pulse: 63  Temp: 98.5 F (36.9 C)  SpO2: 99%  Weight: 254 lb 11.2 oz (115.5 kg)  Height: 5\' 8"  (1.727 m)   Body mass index is  38.73 kg/m.   Physical Exam Vitals and nursing note reviewed.  Constitutional:      Appearance: Normal appearance. She is well-developed. She is obese.  HENT:  Head: Normocephalic and atraumatic.     Nose: Nose normal.     Mouth/Throat:     Mouth: Mucous membranes are moist.     Pharynx: Oropharynx is clear.  Eyes:     Conjunctiva/sclera: Conjunctivae normal.     Pupils: Pupils are equal, round, and reactive to light.  Cardiovascular:     Rate and Rhythm: Normal rate and regular rhythm.     Pulses: Normal pulses.     Heart sounds: Normal heart sounds.  Pulmonary:     Effort: Pulmonary effort is normal.     Breath sounds: Normal breath sounds.  Abdominal:     Palpations: Abdomen is soft.  Musculoskeletal:        General: Normal range of motion.     Cervical back: Normal range of motion and neck supple.  Lymphadenopathy:     Cervical: No cervical adenopathy.  Skin:    General: Skin is warm and dry.     Capillary Refill: Capillary refill takes less than 2 seconds.  Neurological:     General: No focal deficit present.     Mental Status: She is alert and oriented to person, place, and time.  Psychiatric:        Mood and Affect: Mood normal.        Behavior: Behavior normal.        Thought Content: Thought content normal.        Judgment: Judgment normal.    Assessment & Plan:  1. Encounter to establish care Appointment today to establish new primary care provider    2. Other fatigue Patient plans on continued, likely due to busy lifestyle and mild obesity.  Currently taking Wellbutrin to help with weight management.  Is gradually increasing energy levels as she loses weight.  3. Body mass index (BMI) of 38.0-38.9 in adult Improving.  [facility]  Taking Wellbutrin daily and is losing weight. Discussed lowering calorie intake to 1500 calories per day and incorporating exercise into daily routine to help lose weight.    4. At risk for hyperglycemia Patient considered prediabetes.  Most recent hemoglobin A1c 6.1.  She should follow a low carbohydrate and low sugar diet.  We will have check of hemoglobin A1c at next [facility] with [facility].  Problem List Items Addressed This Visit       Other   Body mass index (BMI) of 38.0-38.9 in adult   Other fatigue   At risk for hyperglycemia   Other Visit Diagnoses     Encounter to establish care    -  Primary       Outpatient Encounter Medications as of 05/10/2021  Medication Sig   buPROPion (WELLBUTRIN SR) 200 MG 12 hr tablet Take 1 tablet (200 mg total) by mouth in the morning.   Cholecalciferol (VITAMIN D3) 25 MCG (1000 UT) CAPS Take 3 capsules by mouth daily.   clindamycin (CLEOCIN T) 1 % lotion Apply 1 application topically 2 (two) times daily.   famotidine (PEPCID) 20 MG tablet Take 1 tablet (20 mg total) by mouth 2 (two) times daily as needed for heartburn or indigestion.   fluticasone (FLONASE) 50 MCG/ACT nasal spray 2 sprays each nostril as needed   gabapentin (NEURONTIN) 300 MG capsule Take 1 capsule (300 mg total) by mouth 3 (three) times daily as needed.   MAGNESIUM PO Take 2,600 mg by mouth as needed.   Multiple Vitamins-Minerals (CENTRUM SILVER 50+WOMEN PO)  Take 1 tablet by mouth daily.   predniSONE (DELTASONE) 50 MG tablet Take 1 tablet (50 mg total) by mouth daily.   spironolactone (ALDACTONE) 100 MG tablet Take 100 mg by mouth daily as needed.   tretinoin (RETIN-A) 0.05 % cream Apply 1 application topically at bedtime.   vitamin B-12 (CYANOCOBALAMIN) 1000 MCG tablet Take 1,000 mcg by mouth daily.   Vitamin D, Ergocalciferol, (DRISDOL) 1.25 MG (50000 UNIT) CAPS capsule Take 1 capsule (50,000 Units total) by mouth every 7 (seven) days.   No facility-administered encounter medications on file as of 05/10/2021.    Follow-up: Return in about 6 months (around 11/07/2021) for health maintenance exam, will need to get records  from [place].   Ronnell Freshwater, NP  This note was dictated using Systems analyst. Rapid proofreading was performed to expedite the delivery of the information. Despite proofreading, phonetic errors will occur which are common with this voice recognition software. Please take this into consideration. If there are any concerns, please contact our office.

## 2021-05-16 DIAGNOSIS — Z9189 Other specified personal risk factors, not elsewhere classified: Secondary | ICD-10-CM | POA: Insufficient documentation

## 2021-05-16 NOTE — Patient Instructions (Signed)
Fat and Cholesterol Restricted Eating Plan Getting too much fat and cholesterol in your diet may cause health problems. Choosing the right foods helps keep your fat and cholesterol at normal levels. This can keep you from getting certain diseases. Your doctor may recommend an eating plan that includes: Total fat: ______% or less of total calories a day. This is ______g of fat a day. Saturated fat: ______% or less of total calories a day. This is ______g of saturated fat a day. Cholesterol: less than _________mg a day. Fiber: ______g a day. What are tips for following this plan? General tips Work with your doctor to lose weight if you need to. Avoid: Foods with added sugar. Fried foods. Foods with trans fat or partially hydrogenated oils. This includes some margarines and baked goods. If you drink alcohol: Limit how much you have to: 0-1 drink a day for women who are not pregnant. 0-2 drinks a day for men. Know how much alcohol is in a drink. In the [place] one drink equals one 12 oz bottle of beer (355 mL), one 5 oz glass of wine (148 mL), or one 1 oz glass of hard liquor (44 mL). Reading food labels Check food labels for: Trans fats. Partially hydrogenated oils. Saturated fat (g) in each serving. Cholesterol (mg) in each serving. Fiber (g) in each serving. Choose foods with healthy fats, such as: Monounsaturated fats and polyunsaturated fats. These include olive and canola oil, flaxseeds, walnuts, almonds, and seeds. Omega-3 fats. These are found in certain fish, flaxseed oil, and ground flaxseeds. Choose grain products that have whole grains. Look for the word "whole" as the first word in the ingredient list. Cooking Cook foods using low-fat methods. These include baking, boiling, grilling, and broiling. Eat more home-cooked foods. Eat at restaurants and buffets less often. Eat less fast food. Avoid cooking using saturated fats, such as butter, cream, palm oil, palm kernel oil, and  coconut oil. Meal planning  At meals, divide your plate into four equal parts: Fill one-half of your plate with vegetables, green salads, and fruit. Fill one-fourth of your plate with whole grains. Fill one-fourth of your plate with low-fat (lean) protein foods. Eat fish that is high in omega-3 fats at least two times a week. This includes mackerel, tuna, sardines, and salmon. Eat foods that are high in fiber, such as whole grains, beans, apples, pears, berries, broccoli, carrots, peas, and barley. What foods should I eat? Fruits All fresh, canned (in natural juice), or frozen fruits. Vegetables Fresh or frozen vegetables (raw, steamed, roasted, or grilled). Green salads. Grains Whole grains, such as whole wheat or whole grain breads, crackers, cereals, and pasta. Unsweetened oatmeal, bulgur, barley, quinoa, or brown rice. Corn or whole wheat flour tortillas. Meats and other protein foods Ground beef (85% or leaner), grass-fed beef, or beef trimmed of fat. Skinless chicken or turkey. Ground chicken or turkey. Pork trimmed of fat. All fish and seafood. Egg whites. Dried beans, peas, or lentils. Unsalted nuts or seeds. Unsalted canned beans. Nut butters without added sugar or oil. Dairy Low-fat or nonfat dairy products, such as skim or 1% milk, 2% or reduced-fat cheeses, low-fat and fat-free ricotta or cottage cheese, or plain low-fat and nonfat yogurt. Fats and oils Tub margarine without trans fats. Light or reduced-fat mayonnaise and salad dressings. Avocado. Olive, canola, sesame, or safflower oils. The items listed above may not be a complete list of foods and beverages you can eat. Contact a dietitian for more information. What foods   should I avoid? Fruits Canned fruit in heavy syrup. Fruit in cream or butter sauce. Fried fruit. Vegetables Vegetables cooked in cheese, cream, or butter sauce. Fried vegetables. Grains White bread. White pasta. White rice. Cornbread. Bagels, pastries,  and croissants. Crackers and snack foods that contain trans fat and hydrogenated oils. Meats and other protein foods Fatty cuts of meat. Ribs, chicken wings, bacon, sausage, bologna, salami, chitterlings, fatback, hot dogs, bratwurst, and packaged lunch meats. Liver and organ meats. Whole eggs and egg yolks. Chicken and turkey with skin. Fried meat. Dairy Whole or 2% milk, cream, half-and-half, and cream cheese. Whole milk cheeses. Whole-fat or sweetened yogurt. Full-fat cheeses. Nondairy creamers and whipped toppings. Processed cheese, cheese spreads, and cheese curds. Fats and oils Butter, stick margarine, lard, shortening, ghee, or bacon fat. Coconut, palm kernel, and palm oils. Beverages Alcohol. Sugar-sweetened drinks such as sodas, lemonade, and fruit drinks. Sweets and desserts Corn syrup, sugars, honey, and molasses. Candy. Jam and jelly. Syrup. Sweetened cereals. Cookies, pies, cakes, donuts, muffins, and ice cream. The items listed above may not be a complete list of foods and beverages you should avoid. Contact a dietitian for more information. Summary Choosing the right foods helps keep your fat and cholesterol at normal levels. This can keep you from getting certain diseases. At meals, fill one-half of your plate with vegetables, green salads, and fruits. Eat high fiber foods, like whole grains, beans, apples, pears, berries, carrots, peas, and barley. Limit added sugar, saturated fats, alcohol, and fried foods. This information is not intended to replace advice given to you by your health care provider. Make sure you discuss any questions you have with your health care provider. Document Revised: 08/21/2020 Document Reviewed: 08/21/2020 Elsevier Patient Education  2022 [employer]  

## 2021-05-24 LAB — HM MAMMOGRAPHY

## 2021-05-27 ENCOUNTER — Ambulatory Visit (INDEPENDENT_AMBULATORY_CARE_PROVIDER_SITE_OTHER): Payer: BC Managed Care – PPO | Admitting: Family Medicine

## 2021-06-21 ENCOUNTER — Other Ambulatory Visit: Payer: Self-pay

## 2021-06-21 ENCOUNTER — Ambulatory Visit: Payer: BC Managed Care – PPO | Admitting: Adult Health

## 2021-06-21 ENCOUNTER — Encounter: Payer: Self-pay | Admitting: Adult Health

## 2021-06-21 ENCOUNTER — Ambulatory Visit (INDEPENDENT_AMBULATORY_CARE_PROVIDER_SITE_OTHER): Payer: BC Managed Care – PPO | Admitting: Bariatrics

## 2021-06-21 ENCOUNTER — Encounter (INDEPENDENT_AMBULATORY_CARE_PROVIDER_SITE_OTHER): Payer: Self-pay | Admitting: Bariatrics

## 2021-06-21 VITALS — BP 131/81 | HR 78 | Temp 98.4°F | Ht 68.0 in | Wt 252.0 lb

## 2021-06-21 VITALS — BP 124/82 | HR 79 | Temp 98.0°F | Ht 68.0 in | Wt 254.0 lb

## 2021-06-21 DIAGNOSIS — E669 Obesity, unspecified: Secondary | ICD-10-CM

## 2021-06-21 DIAGNOSIS — G4733 Obstructive sleep apnea (adult) (pediatric): Secondary | ICD-10-CM | POA: Diagnosis not present

## 2021-06-21 DIAGNOSIS — E559 Vitamin D deficiency, unspecified: Secondary | ICD-10-CM | POA: Diagnosis not present

## 2021-06-21 DIAGNOSIS — F3289 Other specified depressive episodes: Secondary | ICD-10-CM | POA: Diagnosis not present

## 2021-06-21 DIAGNOSIS — Z6839 Body mass index (BMI) 39.0-39.9, adult: Secondary | ICD-10-CM

## 2021-06-21 DIAGNOSIS — Z6838 Body mass index (BMI) 38.0-38.9, adult: Secondary | ICD-10-CM

## 2021-06-21 MED ORDER — VITAMIN D (ERGOCALCIFEROL) 1.25 MG (50000 UNIT) PO CAPS: 50000.0000 [IU] | ORAL_CAPSULE | ORAL | 0 refills | Status: DC

## 2021-06-21 MED ORDER — BUPROPION HCL ER (SR) 200 MG PO TB12: 200.0000 mg | ORAL_TABLET | Freq: Every morning | ORAL | 0 refills | Status: DC

## 2021-06-21 NOTE — Progress Notes (Signed)
@Patient  ID: Briana Ferguson, female    DOB: 1961-02-12, 61 y.o.   MRN: [id number]  Chief Complaint  Patient presents with   Follow-up    Referring provider: Ronnell Freshwater, NP  HPI: 61 year old female never smoker followed for obstructive sleep apnea  TEST/EVENTS :  10/04/2018-sleep study- AHI 28.8  06/21/2021 Follow up : OSA  Patient returns for a follow-up visit.  Last seen October 2020.  Patient has underlying moderate obstructive sleep apnea.  She is on nocturnal CPAP.  Patient says she is doing very well on CPAP.  She says she cannot sleep without her CPAP.  She feels rested and feels that she benefits from CPAP with decreased daytime sleepiness.  Patient says she wears her CPAP every single night about 7 hours.  CPAP download shows excellent compliance with 100% usage.  Daily average usage at 7 hours.  Patient is on auto CPAP 6 to 12 cm H2O.  AHI 0.5.  Minimal leaks. She uses a small nasal mask. Gets her supplies from [facility]  Allergies  Allergen Reactions   Other Other (See Comments)   Sulfamethoxazole-Trimethoprim     REACTION: feels faint/per pt not sure if allergic to med    Immunization History  Administered Date(s) Administered   Influenza Split 12/28/2011   Influenza,inj,Quad PF,6+ Mos 01/28/2014, 01/27/2015, 01/05/2016, 02/03/2017, 03/14/2018, 12/20/2018   Influenza-Unspecified 01/20/2020, 01/27/2021   Moderna Sars-Covid-2 Vaccination 05/18/2019, 06/22/2019, 02/24/2020   PPD Test 11/22/2010   Pfizer Covid-19 Vaccine Bivalent Booster 39yrs & up 01/27/2021   Td 12/26/2006   Tdap 02/03/2017   Zoster, Live 12/29/2019    Past Medical History:  Diagnosis Date   Anemia    "as a child"   Anxiety    no per pt   Arthritis    Attention and concentration deficit 2011   had med trial Dr Candis Schatz ? dx    Back pain    Bilateral carpal tunnel syndrome    Diabetes mellitus without complication (HCC)    Dyspnea    Environmental allergies    Cats   GERD  (gastroesophageal reflux disease)    IBS (irritable bowel syndrome)    Joint pain    Lactose intolerance    Lower extremity edema    Multiple food allergies    Other fatigue    Prediabetes    Shortness of breath on exertion    Sleep apnea    wears CPAP   Swallowing difficulty    Tubular adenoma of colon    Vitamin D deficiency     Tobacco History: Social History   Tobacco Use  Smoking Status Never  Smokeless Tobacco Never   Counseling given: Not Answered   Outpatient Medications Prior to Visit  Medication Sig Dispense Refill   buPROPion (WELLBUTRIN SR) 200 MG 12 hr tablet Take 1 tablet (200 mg total) by mouth in the morning. 30 tablet 0   Cholecalciferol (VITAMIN D3) 25 MCG (1000 UT) CAPS Take 3 capsules by mouth daily.     clindamycin (CLEOCIN T) 1 % lotion Apply 1 application topically 2 (two) times daily.     famotidine (PEPCID) 20 MG tablet Take 1 tablet (20 mg total) by mouth 2 (two) times daily as needed for heartburn or indigestion. 60 tablet 1   fluticasone (FLONASE) 50 MCG/ACT nasal spray 2 sprays each nostril as needed 16 g 0   gabapentin (NEURONTIN) 300 MG capsule Take 1 capsule (300 mg total) by mouth 3 (three) times daily as needed. [place]  capsule 3   MAGNESIUM PO Take 2,600 mg by mouth as needed.     Multiple Vitamins-Minerals (CENTRUM SILVER 50+WOMEN PO) Take 1 tablet by mouth daily.     predniSONE (DELTASONE) 50 MG tablet Take 1 tablet (50 mg total) by mouth daily. 5 tablet 0   spironolactone (ALDACTONE) 100 MG tablet Take 100 mg by mouth daily as needed.     tretinoin (RETIN-A) 0.05 % cream Apply 1 application topically at bedtime.     vitamin B-12 (CYANOCOBALAMIN) 1000 MCG tablet Take 1,000 mcg by mouth daily.     Vitamin D, Ergocalciferol, (DRISDOL) 1.25 MG (50000 UNIT) CAPS capsule Take 1 capsule (50,000 Units total) by mouth every 7 (seven) days. 4 capsule 0   No facility-administered medications prior to visit.     Review of Systems:   Constitutional:    No  weight loss, night sweats,  Fevers, chills, fatigue, or  lassitude.  HEENT:   No headaches,  Difficulty swallowing,  Tooth/dental problems, or  Sore throat,                No sneezing, itching, ear ache, nasal congestion, post nasal drip,   CV:  No chest pain,  Orthopnea, PND, swelling in lower extremities, anasarca, dizziness, palpitations, syncope.   GI  No heartburn, indigestion, abdominal pain, nausea, vomiting, diarrhea, change in bowel habits, loss of appetite, bloody stools.   Resp: No shortness of breath with exertion or at rest.  No excess mucus, no productive cough,  No non-productive cough,  No coughing up of blood.  No change in color of mucus.  No wheezing.  No chest wall deformity  Skin: no rash or lesions.  GU: no dysuria, change in color of urine, no urgency or frequency.  No flank pain, no hematuria   MS:  No joint pain or swelling.  No decreased range of motion.  No back pain.    Physical Exam  BP 124/82 (BP Location: Left Arm, Patient Position: Sitting, Cuff Size: Normal)    Pulse 79    Temp 98 F (36.7 C) (Oral)    Ht 5\' 8"  (1.727 m)    Wt 254 lb (115.2 kg)    LMP 04/03/2011    SpO2 98%    BMI 38.62 kg/m   GEN: A/Ox3; pleasant , NAD, well nourished    HEENT:  [place]/AT,  NOSE-clear, THROAT-clear, no lesions, no postnasal drip or exudate noted.  Class 3 MP airway   NECK:  Supple w/ fair ROM; no JVD; normal carotid impulses w/o bruits; no thyromegaly or nodules palpated; no lymphadenopathy.    RESP  Clear  P & A; w/o, wheezes/ rales/ or rhonchi. no accessory muscle use, no dullness to percussion  CARD:  RRR, no m/r/g, no peripheral edema, pulses intact, no cyanosis or clubbing.  GI:   Soft & nt; nml bowel sounds; no organomegaly or masses detected.   Musco: Warm bil, no deformities or joint swelling noted.   Neuro: alert, no focal deficits noted.    Skin: Warm, no lesions or rashes    Lab Results:    BMET   BNP No results found for:  BNP  ProBNP No results found for: PROBNP  Imaging: No results found.    No flowsheet data found.  No results found for: NITRICOXIDE      Assessment & Plan:   OSA (obstructive sleep apnea) Moderate to severe obstructive sleep apnea with excellent control and compliance on nocturnal CPAP  Plan  Patient  Instructions  Keep up good work.  Continue on CPAP At bedtime   Work on healthy weight loss.  Do not drive if sleepy  Follow up with Dr. Ander Slade in 1 year and As needed       Class 2 severe obesity with serious comorbidity and body mass index (BMI) of 39.0 to 39.9 in adult [facility]) Patient is encouraged on healthy weight loss     Rexene Edison, NP 06/21/2021

## 2021-06-21 NOTE — Assessment & Plan Note (Signed)
Patient is encouraged on healthy weight loss

## 2021-06-21 NOTE — Progress Notes (Signed)
Chief Complaint:   OBESITY Briana Ferguson is here to discuss her progress with her obesity treatment plan along with follow-up of her obesity related diagnoses. Briana Ferguson is on the Category 2 Plan and states she is following her eating plan approximately 25% of the time. Briana Ferguson states she is walking stairs for 25 minutes 2 times per week.  Today's visit was #: 11 Starting weight: 242 lbs Starting date: 06/16/2020 Today's weight: 252 lbs Today's date: 06/21/2021 Total lbs lost to d/ate: 0 Total lbs lost since last in-office visit: 0  Interim History: Briana Ferguson weight is up 6 lbs since her last visit. She has had some challenges. Cravings, decreased motivation and obligations.   Subjective:   1. Vitamin D deficiency Briana Ferguson is taking Vitamin D currently.  2. Other depression with emotional eating Briana Ferguson is taking Wellbutrin as directed.   Assessment/Plan:   1. Vitamin D deficiency Low Vitamin D level contributes to fatigue and are associated with obesity, breast, and colon cancer. We will refill prescription Vitamin D 50,000 IU every week for 1 month with no refills and Briana Ferguson will follow-up for routine testing of Vitamin D, at least 2-3 times per year to avoid over-replacement.  - Vitamin D, Ergocalciferol, (DRISDOL) 1.25 MG (50000 UNIT) CAPS capsule; Take 1 capsule (50,000 Units total) by mouth every 7 (seven) days.  Dispense: 4 capsule; Refill: 0  2. Other depression with emotional eating We will refill Wellbutrin 200 mg for 1 month with no refills. Behavior modification techniques were discussed today to help Briana Ferguson deal with her emotional/non-hunger eating behaviors.  Orders and follow up as documented in patient record.   - buPROPion (WELLBUTRIN SR) 200 MG 12 hr tablet; Take 1 tablet (200 mg total) by mouth in the morning.  Dispense: 30 tablet; Refill: 0  3. Obesity BMI today is 38.3 Briana Ferguson is currently in the action stage of change. As such, her goal is to continue with weight loss efforts.  She has agreed to the Category 2 Plan.   Briana Ferguson will continue meal planning and she will continue intentional eating. She will increase her water intake. She will not mis her meals. She will have all the food that she needs.   Exercise goals:  As is.  Behavioral modification strategies: increasing lean protein intake, decreasing simple carbohydrates, increasing vegetables, increasing water intake, decreasing eating out, no skipping meals, meal planning and cooking strategies, keeping healthy foods in the home, and planning for success.  Briana Ferguson has agreed to follow-up with our clinic in 3 weeks with Dr. Leafy Ro, Abby Potash, PA-C or Mina Marble, NP. She was informed of the importance of frequent follow-up visits to maximize her success with intensive lifestyle modifications for her multiple health conditions.   Objective:   Blood pressure 131/81, pulse 78, temperature 98.4 F (36.9 C), height 5\' 8"  (1.727 m), weight 252 lb (114.3 kg), last menstrual period 04/03/2011, SpO2 98 %. Body mass index is 38.32 kg/m.  General: Cooperative, alert, well developed, in no acute distress. HEENT: Conjunctivae and lids unremarkable. Cardiovascular: Regular rhythm.  Lungs: Normal work of breathing. Neurologic: No focal deficits.   Lab Results  Component Value Date   CREATININE 0.98 01/26/2021   BUN 12 01/26/2021   NA 139 01/26/2021   K 4.2 01/26/2021   CL 101 01/26/2021   CO2 25 01/26/2021   Lab Results  Component Value Date   ALT 13 01/26/2021   AST 16 01/26/2021   ALKPHOS 84 01/26/2021   BILITOT 0.5 01/26/2021  Lab Results  Component Value Date   HGBA1C 6.1 (H) 01/26/2021   HGBA1C 6.0 (H) 06/16/2020   HGBA1C 5.9 (H) 08/27/2019   HGBA1C 6.2 (H) 05/07/2019   HGBA1C 6.5 03/19/2019   Lab Results  Component Value Date   INSULIN 13.9 01/26/2021   INSULIN 14.1 06/16/2020   INSULIN 18.9 05/07/2019   Lab Results  Component Value Date   TSH 2.190 06/16/2020   Lab Results   Component Value Date   CHOL 174 06/16/2020   HDL 77 06/16/2020   LDLCALC 85 06/16/2020   TRIG 62 06/16/2020   CHOLHDL 3 03/19/2019   Lab Results  Component Value Date   VD25OH 44.8 01/26/2021   VD25OH 44.4 06/16/2020   VD25OH 69.0 08/27/2019   Lab Results  Component Value Date   WBC 4.4 06/16/2020   HGB 13.1 06/16/2020   HCT 40.1 06/16/2020   MCV 87 06/16/2020   PLT 294 06/16/2020   No results found for: IRON, TIBC, FERRITIN  Attestation Statements:   Reviewed by clinician on day of visit: allergies, medications, problem list, medical history, surgical history, family history, social history, and previous encounter notes.  I, Lizbeth Bark, RMA, am acting as Location manager for [employer], DO.  I have reviewed the above documentation for accuracy and completeness, and I agree with the above. Jearld Lesch, DO

## 2021-06-21 NOTE — Assessment & Plan Note (Signed)
Moderate to severe obstructive sleep apnea with excellent control and compliance on nocturnal CPAP  Plan  Patient Instructions  Keep up good work.  Continue on CPAP At bedtime   Work on healthy weight loss.  Do not drive if sleepy  Follow up with Dr. Ander Slade in 1 year and As needed

## 2021-06-21 NOTE — Patient Instructions (Signed)
Keep up good work.  Continue on CPAP At bedtime   Work on healthy weight loss.  Do not drive if sleepy  Follow up with Dr. Ander Slade in 1 year and As needed

## 2021-06-22 ENCOUNTER — Encounter (INDEPENDENT_AMBULATORY_CARE_PROVIDER_SITE_OTHER): Payer: Self-pay | Admitting: Bariatrics

## 2021-07-13 ENCOUNTER — Ambulatory Visit (INDEPENDENT_AMBULATORY_CARE_PROVIDER_SITE_OTHER): Payer: BC Managed Care – PPO | Admitting: Physician Assistant

## 2021-07-21 ENCOUNTER — Ambulatory Visit (INDEPENDENT_AMBULATORY_CARE_PROVIDER_SITE_OTHER): Payer: BC Managed Care – PPO | Admitting: Family Medicine

## 2021-07-21 ENCOUNTER — Encounter (INDEPENDENT_AMBULATORY_CARE_PROVIDER_SITE_OTHER): Payer: Self-pay | Admitting: Family Medicine

## 2021-07-21 VITALS — BP 139/78 | HR 78 | Temp 97.7°F | Ht 68.0 in | Wt 251.0 lb

## 2021-07-21 DIAGNOSIS — E669 Obesity, unspecified: Secondary | ICD-10-CM | POA: Diagnosis not present

## 2021-07-21 DIAGNOSIS — F3289 Other specified depressive episodes: Secondary | ICD-10-CM | POA: Diagnosis not present

## 2021-07-21 DIAGNOSIS — Z6838 Body mass index (BMI) 38.0-38.9, adult: Secondary | ICD-10-CM | POA: Diagnosis not present

## 2021-07-21 DIAGNOSIS — E559 Vitamin D deficiency, unspecified: Secondary | ICD-10-CM | POA: Diagnosis not present

## 2021-07-21 DIAGNOSIS — E66812 Obesity, class 2: Secondary | ICD-10-CM

## 2021-07-21 MED ORDER — BUPROPION HCL ER (SR) 200 MG PO TB12: 200.0000 mg | ORAL_TABLET | Freq: Every morning | ORAL | 0 refills | Status: DC

## 2021-07-21 MED ORDER — VITAMIN D (ERGOCALCIFEROL) 1.25 MG (50000 UNIT) PO CAPS: 50000.0000 [IU] | ORAL_CAPSULE | ORAL | 0 refills | Status: DC

## 2021-07-26 NOTE — Progress Notes (Signed)
? ? ? ?Chief Complaint:  ? ?OBESITY ?Briana Ferguson is here to discuss her progress with her obesity treatment plan along with follow-up of her obesity related diagnoses. Briana Ferguson is on the Category 2 Plan and states she is following her eating plan approximately 40% of the time. Briana Ferguson states she is taking 3,000 steps per day. ? ?Today's visit was #: 12 ?Starting weight: 242 lbs ?Starting date: 06/16/2020 ?Today's weight: 251 lbs ?Today's date: 07/21/2021 ?Total lbs lost to date: 0 ?Total lbs lost since last in-office visit: 0 ? ?Interim History: Briana Ferguson voices that her cravings for salty crunchy and some baked goods are sabotaging her. She travels 3 days a week and will continue to do so. Briana Ferguson recognizes she eats out frequently. She is feeling some hormonal imbalances.  ? ?Subjective:  ? ?1. Vitamin D deficiency ?Briana Ferguson denies any nausea, vomiting or muscle weakness, but notes fatigue. She is on prescription Vit D. Her last Vit D level of 44.8. ? ?2. Other depression with emotional eating ?Briana Ferguson is currently on Wellbutrin with good control of cravings. She denies suicidal ideas or homicidal ideas. ? ?Assessment/Plan:  ? ?1. Vitamin D deficiency ?We will refill Vit D 50k for 1 month with no refills. ? ?- Refill Vitamin D, Ergocalciferol, (DRISDOL) 1.25 MG (50000 UNIT) CAPS capsule; Take 1 capsule (50,000 Units total) by mouth every 7 (seven) days.  Dispense: 4 capsule; Refill: 0 ? ?2. Other depression with emotional eating ?We will refill Wellbutrin SR for 1 month with no refills. ? ?- Refill buPROPion (WELLBUTRIN SR) 200 MG 12 hr tablet; Take 1 tablet (200 mg total) by mouth in the morning.  Dispense: 30 tablet; Refill: 0 ? ?3. Obesity BMI today is 38.2 ?Briana Ferguson is currently in the action stage of change. As such, her goal is to continue with weight loss efforts. She has agreed to the Category 3 Plan and keeping a food journal and adhering to recommended goals of 350-450 calories and 30+grams of  protein for lunch, 450-550  calories and 40+ grams of protein for supper. ? ?Exercise goals: All adults should avoid inactivity. Some physical activity is better than none, and adults who participate in any amount of physical activity gain some health benefits. ? ?Behavioral modification strategies: increasing lean protein intake, decreasing eating out, meal planning and cooking strategies, and planning for success. ? ?Briana Ferguson has agreed to follow-up with our clinic in 4 weeks. She was informed of the importance of frequent follow-up visits to maximize her success with intensive lifestyle modifications for her multiple health conditions.  ? ?Objective:  ? ?Blood pressure 139/78, pulse 78, temperature 97.7 ?F (36.5 ?C), height '5\' 8"'$  (1.727 m), weight 251 lb (113.9 kg), last menstrual period 04/03/2011, SpO2 97 %. ?Body mass index is 38.16 kg/m?. ? ?General: Cooperative, alert, well developed, in no acute distress. ?HEENT: Conjunctivae and lids unremarkable. ?Cardiovascular: Regular rhythm.  ?Lungs: Normal work of breathing. ?Neurologic: No focal deficits.  ? ?Lab Results  ?Component Value Date  ? CREATININE 0.98 01/26/2021  ? BUN 12 01/26/2021  ? NA 139 01/26/2021  ? K 4.2 01/26/2021  ? CL 101 01/26/2021  ? CO2 25 01/26/2021  ? ?Lab Results  ?Component Value Date  ? ALT 13 01/26/2021  ? AST 16 01/26/2021  ? ALKPHOS 84 01/26/2021  ? BILITOT 0.5 01/26/2021  ? ?Lab Results  ?Component Value Date  ? HGBA1C 6.1 (H) 01/26/2021  ? HGBA1C 6.0 (H) 06/16/2020  ? HGBA1C 5.9 (H) 08/27/2019  ? HGBA1C 6.2 (H) 05/07/2019  ?  HGBA1C 6.5 03/19/2019  ? ?Lab Results  ?Component Value Date  ? INSULIN 13.9 01/26/2021  ? INSULIN 14.1 06/16/2020  ? INSULIN 18.9 05/07/2019  ? ?Lab Results  ?Component Value Date  ? TSH 2.190 06/16/2020  ? ?Lab Results  ?Component Value Date  ? CHOL 174 06/16/2020  ? HDL 77 06/16/2020  ? [place] 85 06/16/2020  ? TRIG 62 06/16/2020  ? CHOLHDL 3 03/19/2019  ? ?Lab Results  ?Component Value Date  ? VD25OH 44.8 01/26/2021  ? VD25OH 44.4  06/16/2020  ? VD25OH 69.0 08/27/2019  ? ?Lab Results  ?Component Value Date  ? WBC 4.4 06/16/2020  ? HGB 13.1 06/16/2020  ? HCT 40.1 06/16/2020  ? MCV 87 06/16/2020  ? PLT 294 06/16/2020  ? ?No results found for: IRON, TIBC, FERRITIN ? ?Attestation Statements:  ? ?Reviewed by clinician on day of visit: allergies, medications, problem list, medical history, surgical history, family history, social history, and previous encounter notes. ? ?I, Brendell Tyus, am acting as transcriptionist for Coralie Common, MD. ?I have reviewed the above documentation for accuracy and completeness, and I agree with the above. Coralie Common, MD ? ?

## 2021-08-16 ENCOUNTER — Ambulatory Visit (INDEPENDENT_AMBULATORY_CARE_PROVIDER_SITE_OTHER): Payer: BC Managed Care – PPO | Admitting: Family Medicine

## 2021-09-08 ENCOUNTER — Ambulatory Visit (INDEPENDENT_AMBULATORY_CARE_PROVIDER_SITE_OTHER): Payer: BC Managed Care – PPO | Admitting: Family Medicine

## 2021-09-08 ENCOUNTER — Encounter (INDEPENDENT_AMBULATORY_CARE_PROVIDER_SITE_OTHER): Payer: Self-pay | Admitting: Family Medicine

## 2021-09-08 VITALS — BP 132/75 | HR 74 | Temp 98.5°F | Ht 68.0 in | Wt 252.0 lb

## 2021-09-08 DIAGNOSIS — E1165 Type 2 diabetes mellitus with hyperglycemia: Secondary | ICD-10-CM | POA: Diagnosis not present

## 2021-09-08 DIAGNOSIS — E559 Vitamin D deficiency, unspecified: Secondary | ICD-10-CM

## 2021-09-08 DIAGNOSIS — F3289 Other specified depressive episodes: Secondary | ICD-10-CM | POA: Diagnosis not present

## 2021-09-08 DIAGNOSIS — E669 Obesity, unspecified: Secondary | ICD-10-CM

## 2021-09-08 DIAGNOSIS — E1169 Type 2 diabetes mellitus with other specified complication: Secondary | ICD-10-CM

## 2021-09-08 DIAGNOSIS — E785 Hyperlipidemia, unspecified: Secondary | ICD-10-CM

## 2021-09-08 DIAGNOSIS — Z6838 Body mass index (BMI) 38.0-38.9, adult: Secondary | ICD-10-CM

## 2021-09-08 MED ORDER — VITAMIN D (ERGOCALCIFEROL) 1.25 MG (50000 UNIT) PO CAPS: 50000.0000 [IU] | ORAL_CAPSULE | ORAL | 0 refills | Status: DC

## 2021-09-08 MED ORDER — BUPROPION HCL ER (SR) 200 MG PO TB12: 200.0000 mg | ORAL_TABLET | Freq: Every morning | ORAL | 0 refills | Status: DC

## 2021-09-09 LAB — COMPREHENSIVE METABOLIC PANEL
ALT: 16 IU/L (ref 0–32)
AST: 15 IU/L (ref 0–40)
Albumin/Globulin Ratio: 1.5 (ref 1.2–2.2)
Albumin: 4.5 g/dL (ref 3.8–4.9)
Alkaline Phosphatase: 91 IU/L (ref 44–121)
BUN/Creatinine Ratio: 11 (ref 9–23)
BUN: 10 mg/dL (ref 6–24)
Bilirubin Total: 0.5 mg/dL (ref 0.0–1.2)
CO2: 21 mmol/L (ref 20–29)
Calcium: 9.5 mg/dL (ref 8.7–10.2)
Chloride: 104 mmol/L (ref 96–106)
Creatinine, Ser: 0.94 mg/dL (ref 0.57–1.00)
Globulin, Total: 3.1 g/dL (ref 1.5–4.5)
Glucose: 103 mg/dL — ABNORMAL HIGH (ref 70–99)
Potassium: 4.3 mmol/L (ref 3.5–5.2)
Sodium: 143 mmol/L (ref 134–144)
Total Protein: 7.6 g/dL (ref 6.0–8.5)
eGFR: 70 mL/min/{1.73_m2} (ref >59)

## 2021-09-09 LAB — LIPID PANEL WITH LDL/HDL RATIO
Cholesterol, Total: 176 mg/dL (ref 100–199)
HDL: 66 mg/dL (ref >39)
LDL Chol Calc (NIH): 91 mg/dL (ref 0–99)
LDL/HDL Ratio: 1.4 ratio (ref 0.0–3.2)
Triglycerides: 107 mg/dL (ref 0–149)
VLDL Cholesterol Cal: 19 mg/dL (ref 5–40)

## 2021-09-09 LAB — INSULIN, RANDOM: INSULIN: 22.3 u[IU]/mL (ref 2.6–24.9)

## 2021-09-09 LAB — VITAMIN D 25 HYDROXY (VIT D DEFICIENCY, FRACTURES): Vit D, 25-Hydroxy: 35.2 ng/mL (ref 30.0–100.0)

## 2021-09-09 LAB — HEMOGLOBIN A1C
Est. average glucose Bld gHb Est-mCnc: 134 mg/dL
Hgb A1c MFr Bld: 6.3 % — ABNORMAL HIGH (ref 4.8–5.6)

## 2021-09-13 NOTE — Progress Notes (Signed)
Chief Complaint:   OBESITY Briana Ferguson is here to discuss her progress with her obesity treatment plan along with follow-up of her obesity related diagnoses. Briana Ferguson is on the Category 3 Plan and keeping a food journal and adhering to recommended goals of 350-450 calories and 30+grams of  protein for lunch, 450-550 calories and 40+ grams of protein for supper and states she is following her eating plan approximately 35% of the time. Briana Ferguson states she is walking and doing chair exercises 45 minutes 3-4 times per week.  Today's visit was #: 12 Starting weight: 242 lbs Starting date: 06/16/2020 Today's weight: 252 lbs Today's date: 09/08/2021 Total lbs lost to date: 0 Total lbs lost since last in-office visit: 1  Interim History: Briana Ferguson voices decrease in motivation and increase in hunger.  She recognizes she has a few celebrations and then vacation coming up.  She is going to [place] for vacation.  Briana Ferguson is looking for strategies to make following meal plan easier.    Subjective:   1. Vitamin D deficiency Briana Ferguson's last Vitamin D was October 2022 with a level of 44.8.  Briana Ferguson denies any nausea, vomiting, or muscle weakness, but complains of fatigue.   2. Type 2 diabetes mellitus with hyperglycemia, without long-term current use of insulin (HCC) Briana Ferguson's last A1c was 6.0, Insulin 13.9 A1c 6.5, (2 years ago).  3. Hyperlipidemia associated with type 2 diabetes mellitus ([place]) Briana Ferguson's last LDL was 85, HDL 77, Triglycerides 62.  Briana Ferguson is currently not on medications.   4. Other depression with emotional eating Briana Ferguson is on Wellbutrin 200 mg with still some emotional eating.  Briana Ferguson denies any suicidal or homicidal ideations.    Assessment/Plan:   1. Vitamin D deficiency Briana Ferguson has agreed to have her vitamin D level obtained today and continue with prescription Vitamin D 50,000 IU every week #4, no refill.  See below.  - VITAMIN D 25 Hydroxy (Vit-D Deficiency, Fractures) - Vitamin D,  Ergocalciferol, (DRISDOL) 1.25 MG (50000 UNIT) CAPS capsule; Take 1 capsule (50,000 Units total) by mouth every 7 (seven) days.  Dispense: 4 capsule; Refill: 0  2. Type 2 diabetes mellitus with hyperglycemia, without long-term current use of insulin (HCC) Briana Ferguson has agreed to have her labs obtained today.  See below. Dicussed initiation of GLP-1 which I agree with, but will wait until next appointment as patient has a trip coming up.  - Comprehensive metabolic panel - Hemoglobin A1c - Insulin, random  3. Hyperlipidemia associated with type 2 diabetes mellitus ([place]) Briana Ferguson agreed to have labs obtained today.  See below.   - Lipid Panel With LDL/HDL Ratio  4. Other depression with emotional eating Briana Ferguson has agreed to continue Wellubutrin 200 mg po daily #30 no refills.   See below.  - buPROPion (WELLBUTRIN SR) 200 MG 12 hr tablet; Take 1 tablet (200 mg total) by mouth in the morning.  Dispense: 30 tablet; Refill: 0  5. Obesity BMI today is 38.3 Briana Ferguson is currently in the action stage of change. As such, her goal is to continue with weight loss efforts. She has agreed to the Category 3 Plan and keeping a food journal and adhering to recommended goals of 1250-1350 calories and 85+ protein daily.   Exercise goals: All adults should avoid inactivity. Some physical activity is better than none, and adults who participate in any amount of physical activity gain some health benefits.  Behavioral modification strategies: increasing lean protein intake, meal planning and cooking strategies, keeping healthy foods in  the home, and planning for success.  Briana Ferguson has agreed to follow-up with our clinic in 4 weeks. She was informed of the importance of frequent follow-up visits to maximize her success with intensive lifestyle modifications for her multiple health conditions.   Briana Ferguson was informed we would discuss her lab results at her next visit unless there is a critical issue that needs to be addressed  sooner. Briana Ferguson agreed to keep her next visit at the agreed upon time to discuss these results.  Objective:   Blood pressure 132/75, pulse 74, temperature 98.5 F (36.9 C), height '5\' 8"'$  (1.727 m), weight 252 lb (114.3 kg), last menstrual period 04/03/2011, SpO2 97 %. Body mass index is 38.32 kg/m.  General: Cooperative, alert, well developed, in no acute distress. HEENT: Conjunctivae and lids unremarkable. Cardiovascular: Regular rhythm.  Lungs: Normal work of breathing. Neurologic: No focal deficits.   Lab Results  Component Value Date   CREATININE 0.94 09/08/2021   BUN 10 09/08/2021   NA 143 09/08/2021   K 4.3 09/08/2021   CL 104 09/08/2021   CO2 21 09/08/2021   Lab Results  Component Value Date   ALT 16 09/08/2021   AST 15 09/08/2021   ALKPHOS 91 09/08/2021   BILITOT 0.5 09/08/2021   Lab Results  Component Value Date   HGBA1C 6.3 (H) 09/08/2021   HGBA1C 6.1 (H) 01/26/2021   HGBA1C 6.0 (H) 06/16/2020   HGBA1C 5.9 (H) 08/27/2019   HGBA1C 6.2 (H) 05/07/2019   Lab Results  Component Value Date   INSULIN 22.3 09/08/2021   INSULIN 13.9 01/26/2021   INSULIN 14.1 06/16/2020   INSULIN 18.9 05/07/2019   Lab Results  Component Value Date   TSH 2.190 06/16/2020   Lab Results  Component Value Date   CHOL 176 09/08/2021   HDL 66 09/08/2021   LDLCALC 91 09/08/2021   TRIG 107 09/08/2021   CHOLHDL 3 03/19/2019   Lab Results  Component Value Date   VD25OH 35.2 09/08/2021   VD25OH 44.8 01/26/2021   VD25OH 44.4 06/16/2020   Lab Results  Component Value Date   WBC 4.4 06/16/2020   HGB 13.1 06/16/2020   HCT 40.1 06/16/2020   MCV 87 06/16/2020   PLT 294 06/16/2020   No results found for: IRON, TIBC, FERRITIN   Attestation Statements:   Reviewed by clinician on day of visit: allergies, medications, problem list, medical history, surgical history, family history, social history, and previous encounter notes.  I, Davy Pique RMA, am acting as Location manager  for Coralie Common, MD.  I have reviewed the above documentation for accuracy and completeness, and I agree with the above. - Coralie Common, MD

## 2021-10-04 ENCOUNTER — Ambulatory Visit (INDEPENDENT_AMBULATORY_CARE_PROVIDER_SITE_OTHER): Payer: BC Managed Care – PPO | Admitting: Family Medicine

## 2021-10-04 ENCOUNTER — Encounter (INDEPENDENT_AMBULATORY_CARE_PROVIDER_SITE_OTHER): Payer: Self-pay | Admitting: Family Medicine

## 2021-10-04 VITALS — BP 127/75 | HR 85 | Temp 99.0°F | Ht 68.0 in | Wt 252.0 lb

## 2021-10-04 DIAGNOSIS — E1165 Type 2 diabetes mellitus with hyperglycemia: Secondary | ICD-10-CM

## 2021-10-04 DIAGNOSIS — F3289 Other specified depressive episodes: Secondary | ICD-10-CM | POA: Diagnosis not present

## 2021-10-04 DIAGNOSIS — Z6838 Body mass index (BMI) 38.0-38.9, adult: Secondary | ICD-10-CM

## 2021-10-04 DIAGNOSIS — E559 Vitamin D deficiency, unspecified: Secondary | ICD-10-CM | POA: Diagnosis not present

## 2021-10-04 DIAGNOSIS — E669 Obesity, unspecified: Secondary | ICD-10-CM

## 2021-10-04 MED ORDER — CHOLECALCIFEROL 1.25 MG (50000 UT) PO TABS: 1.0000 | ORAL_TABLET | ORAL | 0 refills | Status: DC

## 2021-10-04 MED ORDER — BUPROPION HCL ER (SR) 200 MG PO TB12: 200.0000 mg | ORAL_TABLET | Freq: Every morning | ORAL | 0 refills | Status: DC

## 2021-10-04 MED ORDER — BD PEN NEEDLE NANO 2ND GEN 32G X 4 MM MISC: 1.0000 | Freq: Two times a day (BID) | 0 refills | Status: DC

## 2021-10-04 MED ORDER — OZEMPIC (0.25 OR 0.5 MG/DOSE) 2 MG/3ML ~~LOC~~ SOPN
0.2500 mg | PEN_INJECTOR | SUBCUTANEOUS | 0 refills | Status: DC
Start: 2021-10-04 — End: 2021-11-01

## 2021-10-06 NOTE — Progress Notes (Unsigned)
Chief Complaint:   OBESITY Briana Ferguson is here to discuss her progress with her obesity treatment plan along with follow-up of her obesity related diagnoses. Verdelle is on the Category 3 Plan and keeping a food journal and adhering to recommended goals of 1250-1350 calories and 85 +grams of protein and states she is following her eating plan approximately 40% of the time. Pilar states she is walking 20 minutes 5 times per week.  Today's visit was #: 106 Starting weight: 242 lbs Starting date: 06/16/2020 Today's weight: 252 lbs Today's date: 10/04/2021 Total lbs lost to date: 10 lbs Total lbs lost since last in-office visit: 0  Interim History: Kyana was away in [place] last week; unfortunately is was fairly rainy. She did have some urge for salty and sweet carbs. No upcoming plans/events. She wants to stop skipping breakfast.  Subjective:   1. Vitamin D deficiency Latoshia is currently taking prescription Vit D 50,000 IU once a week. Her last Vit D level of 35.2. She voices she stopped taking additional Vit D.  2. Type 2 diabetes mellitus with hyperglycemia, without long-term current use of insulin (HCC) Veora's last A1c at 6.3, insulin at 22.3. She thought about medication initially over vacation.   3. Other depression with emotional eating Latoia is currently taking Wellbutrin with good symptom relief. Denies suicidal ideas, and homicidal ideas.  Assessment/Plan:   1. Vitamin D deficiency We will refill Vit D 50,000 IU once a week for 1 month with 0 refills.  -Refill Cholecalciferol 1.25 MG (50000 UT) TABS; Take 1 tablet by mouth once a week.  Dispense: 12 tablet; Refill: 0  2. Type 2 diabetes mellitus with hyperglycemia, without long-term current use of insulin ([place]) Amira will Start Ozempic 0.25 mg SubQ once a week. We will fill pen needles.  -Fill Insulin Pen Needle (BD PEN NEEDLE NANO 2ND GEN) 32G X 4 MM MISC; 1 Package by Does not apply route 2 (two) times daily.  Dispense: 100  each; Refill: 0  -Start Semaglutide,0.25 or 0.'5MG'$ /DOS, (OZEMPIC, 0.25 OR 0.5 MG/DOSE,) 2 MG/3ML SOPN; Inject 0.25 mg into the skin once a week.  Dispense: 3 mL; Refill: 0  3. Other depression with emotional eating We will refill Wellbutrin 200 mg daily for 1 month with 0 refills.  -Refill buPROPion (WELLBUTRIN SR) 200 MG 12 hr tablet; Take 1 tablet (200 mg total) by mouth in the morning.  Dispense: 30 tablet; Refill: 0  4. Obesity with Current BMI 38.4 Gloris is currently in the action stage of change. As such, her goal is to continue with weight loss efforts. She has agreed to the Category 3 Plan.   Exercise goals: All adults should avoid inactivity. Some physical activity is better than none, and adults who participate in any amount of physical activity gain some health benefits.  Behavioral modification strategies: increasing lean protein intake, meal planning and cooking strategies, keeping healthy foods in the home, and planning for success.  Avari has agreed to follow-up with our clinic in 3 weeks. She was informed of the importance of frequent follow-up visits to maximize her success with intensive lifestyle modifications for her multiple health conditions.   Objective:   Blood pressure 127/75, pulse 85, temperature 99 F (37.2 C), height '5\' 8"'$  (1.727 m), weight 252 lb (114.3 kg), last menstrual period 04/03/2011, SpO2 97 %. Body mass index is 38.32 kg/m.  General: Cooperative, alert, well developed, in no acute distress. HEENT: Conjunctivae and lids unremarkable. Cardiovascular: Regular rhythm.  Lungs:  Normal work of breathing. Neurologic: No focal deficits.   Lab Results  Component Value Date   CREATININE 0.94 09/08/2021   BUN 10 09/08/2021   NA 143 09/08/2021   K 4.3 09/08/2021   CL 104 09/08/2021   CO2 21 09/08/2021   Lab Results  Component Value Date   ALT 16 09/08/2021   AST 15 09/08/2021   ALKPHOS 91 09/08/2021   BILITOT 0.5 09/08/2021   Lab Results   Component Value Date   HGBA1C 6.3 (H) 09/08/2021   HGBA1C 6.1 (H) 01/26/2021   HGBA1C 6.0 (H) 06/16/2020   HGBA1C 5.9 (H) 08/27/2019   HGBA1C 6.2 (H) 05/07/2019   Lab Results  Component Value Date   INSULIN 22.3 09/08/2021   INSULIN 13.9 01/26/2021   INSULIN 14.1 06/16/2020   INSULIN 18.9 05/07/2019   Lab Results  Component Value Date   TSH 2.190 06/16/2020   Lab Results  Component Value Date   CHOL 176 09/08/2021   HDL 66 09/08/2021   LDLCALC 91 09/08/2021   TRIG 107 09/08/2021   CHOLHDL 3 03/19/2019   Lab Results  Component Value Date   VD25OH 35.2 09/08/2021   VD25OH 44.8 01/26/2021   VD25OH 44.4 06/16/2020   Lab Results  Component Value Date   WBC 4.4 06/16/2020   HGB 13.1 06/16/2020   HCT 40.1 06/16/2020   MCV 87 06/16/2020   PLT 294 06/16/2020   No results found for: "IRON", "TIBC", "FERRITIN"  Attestation Statements:   Reviewed by clinician on day of visit: allergies, medications, problem list, medical history, surgical history, family history, social history, and previous encounter notes.  I, Elnora Morrison, RMA am acting as transcriptionist for Coralie Common, MD.  I have reviewed the above documentation for accuracy and completeness, and I agree with the above. -  ***

## 2021-10-25 ENCOUNTER — Ambulatory Visit: Payer: BC Managed Care – PPO | Admitting: Internal Medicine

## 2021-10-25 ENCOUNTER — Ambulatory Visit (INDEPENDENT_AMBULATORY_CARE_PROVIDER_SITE_OTHER): Payer: BC Managed Care – PPO | Admitting: Internal Medicine

## 2021-10-25 VITALS — BP 118/76 | HR 66 | Temp 98.6°F | Ht 68.0 in | Wt 252.4 lb

## 2021-10-25 DIAGNOSIS — G5600 Carpal tunnel syndrome, unspecified upper limb: Secondary | ICD-10-CM | POA: Diagnosis not present

## 2021-10-25 DIAGNOSIS — E1165 Type 2 diabetes mellitus with hyperglycemia: Secondary | ICD-10-CM

## 2021-10-25 DIAGNOSIS — M79641 Pain in right hand: Secondary | ICD-10-CM | POA: Diagnosis not present

## 2021-10-25 DIAGNOSIS — Z820 Family history of epilepsy and other diseases of the nervous system: Secondary | ICD-10-CM

## 2021-10-25 DIAGNOSIS — M79642 Pain in left hand: Secondary | ICD-10-CM

## 2021-10-25 NOTE — Progress Notes (Signed)
Chief Complaint  Patient presents with   Wrist Pain    Both wrist mainly right     HPI: Briana Ferguson 61 y.o. come in for ongoing symptoms described as CTS.  Concern about progression. 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 Now her right hand is bothering more than the left it is hard to grip things both from tingling in the medial hand and also the middle finger. Now had middle finger r more than left  with hard to bend   and   hard to use.   She does 10 to pick up things with her finger no injury but has heavy bags and pulling has keyboard intensive job. In the remote past tried some night splints with some moderate or minimal success not a problem now.   Not awakening  and taking magnesiun and eased it. And was seeing chiro and massage and helped.   Has etablished with a different PCP practice as of jan 23 but would like to still come here  .  Last visit with me was August 2022 for COVID infection ROS: See pertinent positives and negatives per HPI. Mom  had cts  and had surgery  and now less use of hands she is diabetic Patient herself is under weight management as it has borderline diabetic numbers and now is on Ozempic with some help of weight loss.  Ozempic   Past Medical History:  Diagnosis Date   Anemia    "as a child"   Anxiety    no per pt   Arthritis    Attention and concentration deficit 2011   had med trial Dr Candis Schatz ? dx    Back pain    Bilateral carpal tunnel syndrome    Diabetes mellitus without complication ([place])    Dyspnea    Environmental allergies    Cats   GERD (gastroesophageal reflux disease)    IBS (irritable bowel syndrome)    Joint pain    Lactose intolerance    Lower extremity edema    Multiple food allergies    Other fatigue    Prediabetes    Shortness of breath on exertion    Sleep apnea     wears CPAP   Swallowing difficulty    Tubular adenoma of colon    Vitamin D deficiency     Family History  Problem Relation Age of Onset   Diabetes Father    Stroke Father        deceased   Heart disease Father    Sleep apnea Father    Hypertension Father    Sudden death Father    Hyperlipidemia Father    Diabetes Mother    Stroke Mother    Sleep apnea Mother    Hypertension Mother    Heart disease Mother    Thyroid disease Mother    Obesity Mother    Hyperlipidemia Mother    Kidney disease Mother    Hyperlipidemia Other        fhx   Cancer Other        granfather/liver   Diabetes Maternal Grandmother    Heart disease Maternal Grandmother    Liver cancer Maternal Grandfather    Diabetes Paternal Grandmother    Heart disease Paternal Grandmother    Colon cancer Neg Hx    Stomach cancer Neg Hx  Pancreatic cancer Neg Hx    Esophageal cancer Neg Hx    Rectal cancer Neg Hx     Social History   Socioeconomic History   Marital status: Married    Spouse name: Daija Routson   Number of children: Not on file   Years of education: Not on file   Highest education level: Bachelor's degree (e.g., BA, AB, BS)  Occupational History   Occupation: TEFL teacher: [employer]  Tobacco Use   Smoking status: Never   Smokeless tobacco: Never  Vaping Use   Vaping Use: Never used  Substance and Sexual Activity   Alcohol use: No    Comment: none   Drug use: No   Sexual activity: Yes    Partners: Male  Other Topics Concern   Not on file  Social History Narrative   [name] degree  May 2011.  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      Has smoke detector and wears seat belts.  No firearms. Stored safely .Sees dentist regularly . No depression       CB x 3    Husband with vascectomy            Social  Determinants of Health   Financial Resource Strain: Low Risk  (10/25/2021)   Overall Financial Resource Strain (CARDIA)    Difficulty of Paying Living Expenses: Not hard at all  Food Insecurity: No Food Insecurity (10/25/2021)   Hunger Vital Sign    Worried About Running Out of Food in the Last Year: Never true    Ran Out of Food in the Last Year: Never true  Transportation Needs: No Transportation Needs (10/25/2021)   PRAPARE - Hydrologist (Medical): No    Lack of Transportation (Non-Medical): No  Physical Activity: Insufficiently Active (10/25/2021)   Exercise Vital Sign    Days of Exercise per Week: 3 days    Minutes of Exercise per Session: 30 min  Stress: No Stress Concern Present (10/25/2021)   [place]    Feeling of Stress : Not at all  Social Connections: [place] (10/25/2021)   Social Connection and Isolation Panel [NHANES]    Frequency of Communication with Friends and Family: More than three times a week    Frequency of Social Gatherings with Friends and Family: Twice a week    Attends Religious Services: More than 4 times per year    Active Member of [employer] or Organizations: Yes    Attends Music therapist: More than 4 times per year    Marital Status: Married    Outpatient Medications Prior to Visit  Medication Sig Dispense Refill   buPROPion (WELLBUTRIN SR) 200 MG 12 hr tablet Take 1 tablet (200 mg total) by mouth in the morning. 30 tablet 0   Cholecalciferol 1.25 MG (50000 UT) TABS Take 1 tablet by mouth once a week. 12 tablet 0   clindamycin (CLEOCIN T) 1 % lotion Apply 1 application topically 2 (two) times daily.     famotidine (PEPCID) 20 MG tablet Take 1 tablet (20 mg total) by mouth 2 (two) times daily as needed for heartburn or indigestion. 60 tablet 1  fluticasone (FLONASE) 50 MCG/ACT nasal spray 2 sprays each nostril as needed 16 g 0   Insulin Pen  Needle (BD PEN NEEDLE NANO 2ND GEN) 32G X 4 MM MISC 1 Package by Does not apply route 2 (two) times daily. 100 each 0   MAGNESIUM PO Take 2,600 mg by mouth as needed.     Multiple Vitamins-Minerals (CENTRUM SILVER 50+WOMEN PO) Take 1 tablet by mouth daily.     Semaglutide,0.25 or 0.'5MG'$ /DOS, (OZEMPIC, 0.25 OR 0.5 MG/DOSE,) 2 MG/3ML SOPN Inject 0.25 mg into the skin once a week. 3 mL 0   spironolactone (ALDACTONE) 100 MG tablet Take 100 mg by mouth daily as needed.     tretinoin (RETIN-A) 0.05 % cream Apply 1 application topically at bedtime.     vitamin B-12 (CYANOCOBALAMIN) 1000 MCG tablet Take 1,000 mcg by mouth daily.     Vitamin D, Ergocalciferol, (DRISDOL) 1.25 MG (50000 UNIT) CAPS capsule Take 1 capsule (50,000 Units total) by mouth every 7 (seven) days. 4 capsule 0   gabapentin (NEURONTIN) 300 MG capsule Take 1 capsule (300 mg total) by mouth 3 (three) times daily as needed. 90 capsule 3   No facility-administered medications prior to visit.     EXAM:  BP 118/76 (BP Location: Left Arm, Patient Position: Sitting, Cuff Size: Normal)   Pulse 66   Temp 98.6 F (37 C) (Oral)   Ht '5\' 8"'$  (1.727 m)   Wt 252 lb 6.4 oz (114.5 kg)   LMP 04/03/2011   SpO2 99%   BMI 38.38 kg/m   Body mass index is 38.38 kg/m.  GENERAL: vitals reviewed and listed above, alert, oriented, appears well hydrated and in no acute distress HEENT: atraumatic, conjunctiva  clear, no obvious abnormalities on inspection of external nose and ears  MS: moves all extremities right hand with weaker grip uncertain if from pain weakness or both.  No obvious atrophy.  Middle finger is mildly swollen but not acute question arthritic no pain along the tendon.  Pulses intact with normal color.   PSYCH: pleasant and cooperative, no obvious depression or anxiety Lab Results  Component Value Date   WBC 4.4 06/16/2020   HGB 13.1 06/16/2020   HCT 40.1 06/16/2020   PLT 294 06/16/2020   GLUCOSE 103 (H) 09/08/2021   CHOL 176  09/08/2021   TRIG 107 09/08/2021   HDL 66 09/08/2021   LDLCALC 91 09/08/2021   ALT 16 09/08/2021   AST 15 09/08/2021   NA 143 09/08/2021   K 4.3 09/08/2021   CL 104 09/08/2021   CREATININE 0.94 09/08/2021   BUN 10 09/08/2021   CO2 21 09/08/2021   TSH 2.190 06/16/2020   HGBA1C 6.3 (H) 09/08/2021   BP Readings from Last 3 Encounters:  10/25/21 118/76  10/04/21 127/75  09/08/21 132/75   Wt Readings from Last 3 Encounters:  10/25/21 252 lb 6.4 oz (114.5 kg)  10/04/21 252 lb (114.3 kg)  09/08/21 252 lb (114.3 kg)     ASSESSMENT AND PLAN:  Discussed the following assessment and plan:  Bilateral hand pain - Plan: Ambulatory referral to Hand Surgery  Carpal tunnel syndrome, unspecified laterality - right more than left had ncs 2013 cw moderate median nerve neuropathy - Plan: Ambulatory referral to Hand Surgery  Type 2 diabetes mellitus with hyperglycemia, without long-term current use of insulin (HCC)  Family history of carpal tunnel syndrome Previous diagnosis with nerve conduction study more symptoms currently family history positive as well as diabetes or hyperglycemia.  She is working on metabolic factors I agree she should see a Copy or specialist may have to repeat the nerve conduction studies may have a combination of CTS plus arthritic or tendinopathy Mitigate activity position or lifting gripping.  In the interim.  -Patient advised to return or notify health care team  if  new concerns arise.  Patient Instructions  Referring to  hand surgery specialist   .  Be attentive to lifting and work position.   Standley Brooking. Dannie Woolen M.D.

## 2021-10-25 NOTE — Patient Instructions (Addendum)
Referring to  hand surgery specialist   .  Be attentive to lifting and work position.

## 2021-11-01 ENCOUNTER — Encounter (INDEPENDENT_AMBULATORY_CARE_PROVIDER_SITE_OTHER): Payer: Self-pay | Admitting: Family Medicine

## 2021-11-01 ENCOUNTER — Ambulatory Visit (INDEPENDENT_AMBULATORY_CARE_PROVIDER_SITE_OTHER): Payer: BC Managed Care – PPO | Admitting: Family Medicine

## 2021-11-01 VITALS — BP 128/74 | HR 81 | Temp 98.2°F | Ht 68.0 in | Wt 246.0 lb

## 2021-11-01 DIAGNOSIS — E1165 Type 2 diabetes mellitus with hyperglycemia: Secondary | ICD-10-CM

## 2021-11-01 DIAGNOSIS — E669 Obesity, unspecified: Secondary | ICD-10-CM | POA: Diagnosis not present

## 2021-11-01 DIAGNOSIS — E559 Vitamin D deficiency, unspecified: Secondary | ICD-10-CM

## 2021-11-01 DIAGNOSIS — F3289 Other specified depressive episodes: Secondary | ICD-10-CM

## 2021-11-01 DIAGNOSIS — Z6837 Body mass index (BMI) 37.0-37.9, adult: Secondary | ICD-10-CM

## 2021-11-01 DIAGNOSIS — Z7985 Long-term (current) use of injectable non-insulin antidiabetic drugs: Secondary | ICD-10-CM

## 2021-11-01 MED ORDER — OZEMPIC (0.25 OR 0.5 MG/DOSE) 2 MG/3ML ~~LOC~~ SOPN: 0.5000 mg | PEN_INJECTOR | SUBCUTANEOUS | 0 refills | Status: DC

## 2021-11-01 MED ORDER — BUPROPION HCL ER (SR) 200 MG PO TB12: 200.0000 mg | ORAL_TABLET | Freq: Every morning | ORAL | 0 refills | Status: DC

## 2021-11-02 NOTE — Progress Notes (Signed)
Chief Complaint:   OBESITY Briana Ferguson is here to discuss her progress with her obesity treatment plan along with follow-up of her obesity related diagnoses. Briana Ferguson is on the Category 3 Plan and states she is following her eating plan approximately 50% of the time. Briana Ferguson states she is walking 30-40 minutes 6 times per week.  Today's visit was #: 14 Starting weight: 242 lbs Starting date: 06/16/2020 Today's weight: 246 lbs Today's date: 11/01/2021 Total lbs lost to date: 0 Total lbs lost since last in-office visit: 0  Interim History: Briana Ferguson has added some walking over last few weeks. She did well on Ozempic--only had 1 injection malfunction. She needs another Cat 3 paper. Wants to follow Cat 3 for the next few weeks.  Subjective:   1. Vitamin D deficiency Briana Ferguson is currently taking prescription Vit D 50,000 IU once a week. Denies any nausea, vomiting or muscle weakness. She notes fatigue.  2. Type 2 diabetes mellitus with hyperglycemia, without long-term current use of insulin (HCC) Briana Ferguson is currently on Ozempic 0.25 mg---noticed more consistent control of intake. Denies GI side effects.  3. Other depression with emotional eating Briana Ferguson is currently taking Wellbutrin. Her blood pressure is well controlled. Denies suicidal ideas, and homicidal ideas.  Assessment/Plan:   1. Vitamin D deficiency Briana Ferguson will continue prescription Vit D.  2. Type 2 diabetes mellitus with hyperglycemia, without long-term current use of insulin (HCC) INCREASE Ozempic to 0.5 mg SubQ once weekly for 1 month with 0 refills.   -Increase Semaglutide,0.25 or 0.'5MG'$ /DOS, (OZEMPIC, 0.25 OR 0.5 MG/DOSE,) 2 MG/3ML SOPN; Inject 0.5 mg into the skin once a week.  Dispense: 3 mL; Refill: 0  3. Other depression with emotional eating We will refill Wellbutrin SR 200 mg by mouth twice a day for 1 month with 0 refills.  -Refill buPROPion (WELLBUTRIN SR) 200 MG 12 hr tablet; Take 1 tablet (200 mg total) by mouth in the  morning.  Dispense: 30 tablet; Refill: 0  4. Obesity with Current BMI of 37.5 Briana Ferguson is currently in the action stage of change. As such, her goal is to continue with weight loss efforts. She has agreed to the Category 3 Plan.   Exercise goals: As is.  Behavioral modification strategies: increasing lean protein intake, meal planning and cooking strategies, keeping healthy foods in the home, and planning for success.  Briana Ferguson has agreed to follow-up with our clinic in 3 weeks. She was informed of the importance of frequent follow-up visits to maximize her success with intensive lifestyle modifications for her multiple health conditions.   Objective:   Blood pressure 128/74, pulse 81, temperature 98.2 F (36.8 C), height '5\' 8"'$  (1.727 m), weight 246 lb (111.6 kg), last menstrual period 04/03/2011, SpO2 97 %. Body mass index is 37.4 kg/m.  General: Cooperative, alert, well developed, in no acute distress. HEENT: Conjunctivae and lids unremarkable. Cardiovascular: Regular rhythm.  Lungs: Normal work of breathing. Neurologic: No focal deficits.   Lab Results  Component Value Date   CREATININE 0.94 09/08/2021   BUN 10 09/08/2021   NA 143 09/08/2021   K 4.3 09/08/2021   CL 104 09/08/2021   CO2 21 09/08/2021   Lab Results  Component Value Date   ALT 16 09/08/2021   AST 15 09/08/2021   ALKPHOS 91 09/08/2021   BILITOT 0.5 09/08/2021   Lab Results  Component Value Date   HGBA1C 6.3 (H) 09/08/2021   HGBA1C 6.1 (H) 01/26/2021   HGBA1C 6.0 (H) 06/16/2020  HGBA1C 5.9 (H) 08/27/2019   HGBA1C 6.2 (H) 05/07/2019   Lab Results  Component Value Date   INSULIN 22.3 09/08/2021   INSULIN 13.9 01/26/2021   INSULIN 14.1 06/16/2020   INSULIN 18.9 05/07/2019   Lab Results  Component Value Date   TSH 2.190 06/16/2020   Lab Results  Component Value Date   CHOL 176 09/08/2021   HDL 66 09/08/2021   LDLCALC 91 09/08/2021   TRIG 107 09/08/2021   CHOLHDL 3 03/19/2019   Lab Results   Component Value Date   VD25OH 35.2 09/08/2021   VD25OH 44.8 01/26/2021   VD25OH 44.4 06/16/2020   Lab Results  Component Value Date   WBC 4.4 06/16/2020   HGB 13.1 06/16/2020   HCT 40.1 06/16/2020   MCV 87 06/16/2020   PLT 294 06/16/2020   No results found for: "IRON", "TIBC", "FERRITIN"  Attestation Statements:   Reviewed by clinician on day of visit: allergies, medications, problem list, medical history, surgical history, family history, social history, and previous encounter notes.  I, Elnora Morrison, RMA am acting as transcriptionist for Coralie Common, MD.  I have reviewed the above documentation for accuracy and completeness, and I agree with the above. - Coralie Common, MD

## 2021-11-08 ENCOUNTER — Encounter: Payer: BC Managed Care – PPO | Admitting: Nurse Practitioner

## 2021-11-22 ENCOUNTER — Ambulatory Visit (INDEPENDENT_AMBULATORY_CARE_PROVIDER_SITE_OTHER): Payer: BC Managed Care – PPO | Admitting: Family Medicine

## 2021-11-23 ENCOUNTER — Ambulatory Visit (INDEPENDENT_AMBULATORY_CARE_PROVIDER_SITE_OTHER): Payer: BC Managed Care – PPO | Admitting: Family Medicine

## 2021-11-23 ENCOUNTER — Encounter (INDEPENDENT_AMBULATORY_CARE_PROVIDER_SITE_OTHER): Payer: Self-pay | Admitting: Family Medicine

## 2021-11-23 VITALS — BP 118/67 | HR 73 | Temp 98.8°F | Ht 68.0 in | Wt 248.0 lb

## 2021-11-23 DIAGNOSIS — F3289 Other specified depressive episodes: Secondary | ICD-10-CM | POA: Diagnosis not present

## 2021-11-23 DIAGNOSIS — Z6837 Body mass index (BMI) 37.0-37.9, adult: Secondary | ICD-10-CM

## 2021-11-23 DIAGNOSIS — E119 Type 2 diabetes mellitus without complications: Secondary | ICD-10-CM | POA: Insufficient documentation

## 2021-11-23 DIAGNOSIS — E1169 Type 2 diabetes mellitus with other specified complication: Secondary | ICD-10-CM

## 2021-11-23 DIAGNOSIS — E669 Obesity, unspecified: Secondary | ICD-10-CM | POA: Diagnosis not present

## 2021-11-23 DIAGNOSIS — Z7985 Long-term (current) use of injectable non-insulin antidiabetic drugs: Secondary | ICD-10-CM

## 2021-11-23 IMAGING — CR DG CHEST 2V
2 series · 2 of 2 positions shown · non-contrast
Comparison: Prior chest radiographs 01/05/2016.

CLINICAL DATA: Chest pain. Additional history provided: Central
chest pain with radiation into back as well as left shoulder. Nausea
with some lightheadedness.

EXAM:
CHEST - 2 VIEW

[w chest pa]
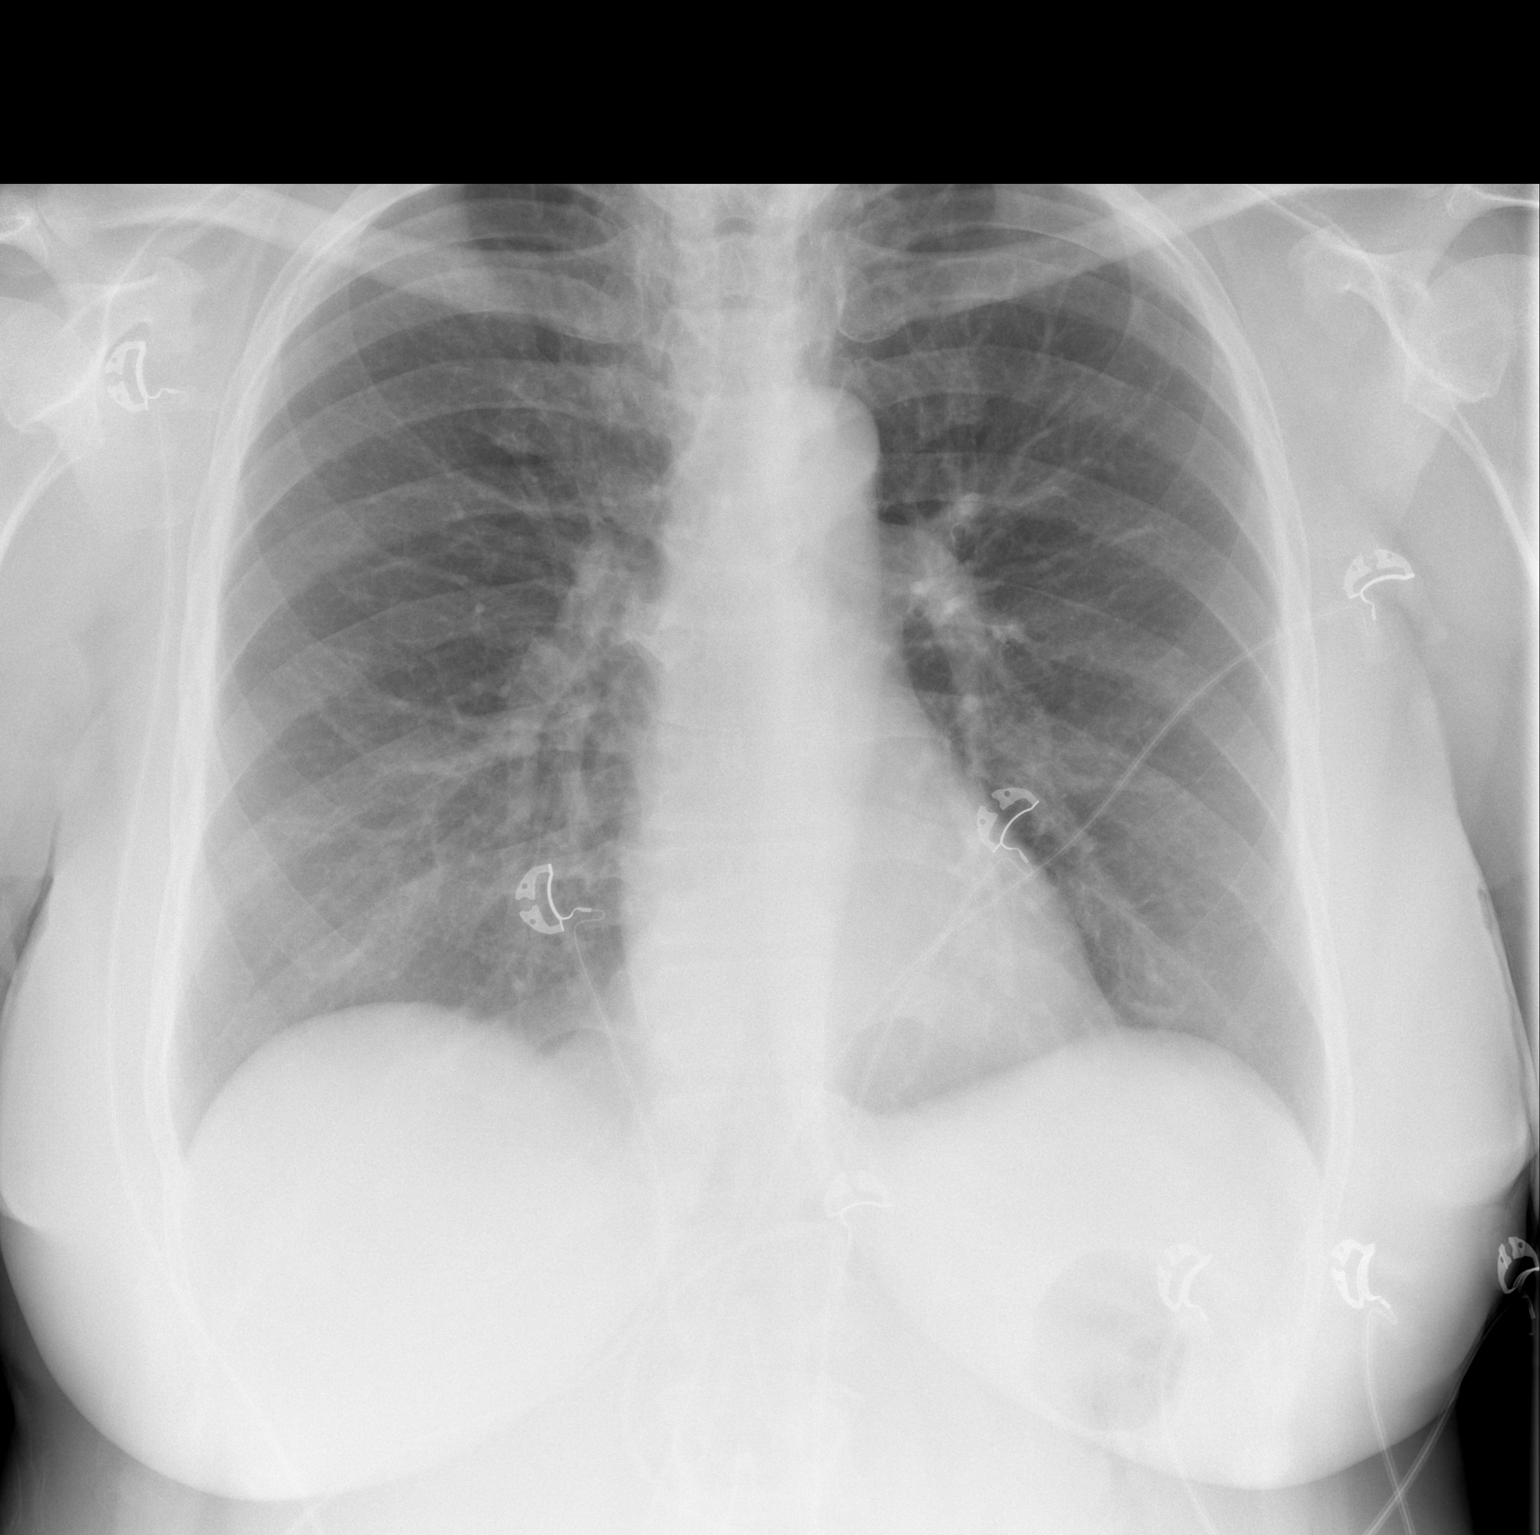

[w chest lat]
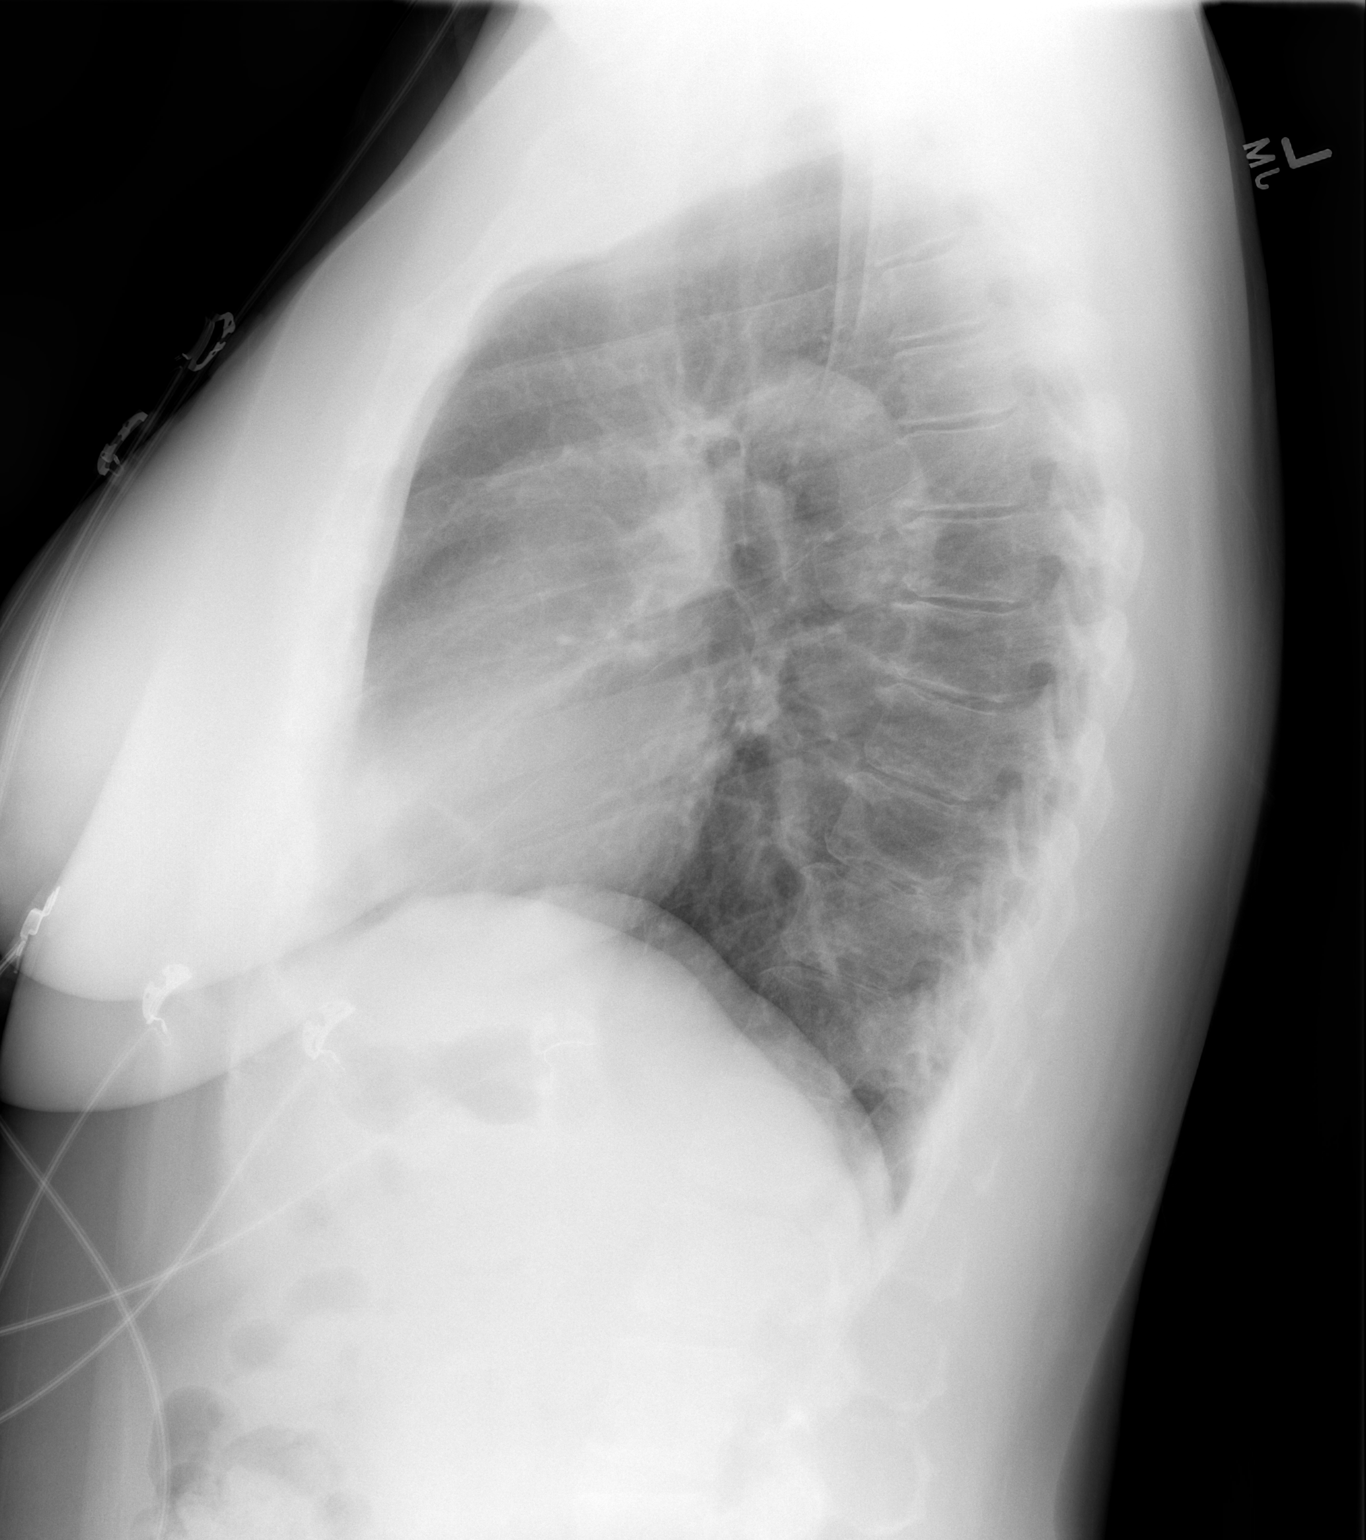

[2 of 2 positions shown; findings below may reference images not displayed]

FINDINGS: Heart size within normal limits.

There is no appreciable airspace consolidation.

No evidence of pleural effusion or pneumothorax.

No acute bony abnormality identified. Thoracic spondylosis.
IMPRESSION: No evidence of acute cardiopulmonary abnormality.

## 2021-12-01 ENCOUNTER — Encounter (INDEPENDENT_AMBULATORY_CARE_PROVIDER_SITE_OTHER): Payer: Self-pay

## 2021-12-05 NOTE — Progress Notes (Unsigned)
Chief Complaint:   OBESITY Briana Ferguson is here to discuss her progress with her obesity treatment plan along with follow-up of her obesity related diagnoses. Briana Ferguson is on the Category 3 Plan and states she is following her eating plan approximately 40% of the time. Briana Ferguson states she is walking for 30 minutes 2 times per week.  Today's visit was #: 15 Starting weight: 242 lbs Starting date: 06/16/2020 Today's weight: 248 lbs Today's date: 11/23/2021 Total lbs lost to date: 0 Total lbs lost since last in-office visit: 0  Interim History: Briana Ferguson has decreased polyphagia on Ozempic for diabetes mellitus.  She has been skipping meals and this has likely decreased her RMR.  She is working on getting back on track and making strategies to help her remember to eat all of her nutrition.  Subjective:   1. Type 2 diabetes mellitus with other specified complication, without long-term current use of insulin (HCC) Briana Ferguson was recently started back on Ozempic.  Her A1c has started to increase despite doing better with weight loss.  She notes decreased polyphagia with her Ozempic, but she is not eating enough nutrition.  2. Other depression with emotional eating Briana Ferguson stable on Wellbutrin with no side effects noted.  Her blood pressure is controlled.  Assessment/Plan:   1. Type 2 diabetes mellitus with other specified complication, without long-term current use of insulin (Briana Ferguson) Briana Ferguson will continue Ozempic at her current dose, and she will work on not skipping meals.  2. Other depression with emotional eating Briana Ferguson will continue Wellbutrin as is, and we will continue to follow.   3. Obesity, Current BMI 37.8 Briana Ferguson is currently in the action stage of change. As such, her goal is to continue with weight loss efforts. She has agreed to the Category 3 Plan.   Exercise goals: As is.   Behavioral modification strategies: increasing lean protein intake, no skipping meals, and meal planning and cooking  strategies.  Briana Ferguson has agreed to follow-up with our clinic in 3 to 4 weeks. She was informed of the importance of frequent follow-up visits to maximize her success with intensive lifestyle modifications for her multiple health conditions.   Objective:   Blood pressure 118/67, pulse 73, temperature 98.8 F (37.1 C), height '5\' 8"'$  (1.727 m), weight 248 lb (112.5 kg), last menstrual period 04/03/2011, SpO2 98 %. Body mass index is 37.71 kg/m.  General: Cooperative, alert, well developed, in no acute distress. HEENT: Conjunctivae and lids unremarkable. Cardiovascular: Regular rhythm.  Lungs: Normal work of breathing. Neurologic: No focal deficits.   Lab Results  Component Value Date   CREATININE 0.94 09/08/2021   BUN 10 09/08/2021   NA 143 09/08/2021   K 4.3 09/08/2021   CL 104 09/08/2021   CO2 21 09/08/2021   Lab Results  Component Value Date   ALT 16 09/08/2021   AST 15 09/08/2021   ALKPHOS 91 09/08/2021   BILITOT 0.5 09/08/2021   Lab Results  Component Value Date   HGBA1C 6.3 (H) 09/08/2021   HGBA1C 6.1 (H) 01/26/2021   HGBA1C 6.0 (H) 06/16/2020   HGBA1C 5.9 (H) 08/27/2019   HGBA1C 6.2 (H) 05/07/2019   Lab Results  Component Value Date   INSULIN 22.3 09/08/2021   INSULIN 13.9 01/26/2021   INSULIN 14.1 06/16/2020   INSULIN 18.9 05/07/2019   Lab Results  Component Value Date   TSH 2.190 06/16/2020   Lab Results  Component Value Date   CHOL 176 09/08/2021   HDL 66 09/08/2021  LDLCALC 91 09/08/2021   TRIG 107 09/08/2021   CHOLHDL 3 03/19/2019   Lab Results  Component Value Date   VD25OH 35.2 09/08/2021   VD25OH 44.8 01/26/2021   VD25OH 44.4 06/16/2020   Lab Results  Component Value Date   WBC 4.4 06/16/2020   HGB 13.1 06/16/2020   HCT 40.1 06/16/2020   MCV 87 06/16/2020   PLT 294 06/16/2020   No results found for: "IRON", "TIBC", "FERRITIN"  Attestation Statements:   Reviewed by clinician on day of visit: allergies, medications, problem list,  medical history, surgical history, family history, social history, and previous encounter notes.  Time spent on visit including pre-visit chart review and post-visit care and charting was 52 minutes.   I, Trixie Dredge, am acting as transcriptionist for Dennard Nip, MD.  I have reviewed the above documentation for accuracy and completeness, and I agree with the above. -  Dennard Nip, MD

## 2021-12-13 ENCOUNTER — Ambulatory Visit (INDEPENDENT_AMBULATORY_CARE_PROVIDER_SITE_OTHER): Payer: BC Managed Care – PPO | Admitting: Family Medicine

## 2021-12-13 ENCOUNTER — Encounter (INDEPENDENT_AMBULATORY_CARE_PROVIDER_SITE_OTHER): Payer: Self-pay | Admitting: Family Medicine

## 2021-12-13 VITALS — BP 116/66 | HR 64 | Temp 98.6°F | Ht 68.0 in | Wt 242.0 lb

## 2021-12-13 DIAGNOSIS — E669 Obesity, unspecified: Secondary | ICD-10-CM

## 2021-12-13 DIAGNOSIS — E559 Vitamin D deficiency, unspecified: Secondary | ICD-10-CM

## 2021-12-13 DIAGNOSIS — Z6836 Body mass index (BMI) 36.0-36.9, adult: Secondary | ICD-10-CM | POA: Diagnosis not present

## 2021-12-13 DIAGNOSIS — K5900 Constipation, unspecified: Secondary | ICD-10-CM

## 2021-12-13 MED ORDER — CHOLECALCIFEROL 1.25 MG (50000 UT) PO TABS: 1.0000 | ORAL_TABLET | ORAL | 0 refills | Status: DC

## 2021-12-14 ENCOUNTER — Other Ambulatory Visit (INDEPENDENT_AMBULATORY_CARE_PROVIDER_SITE_OTHER): Payer: Self-pay | Admitting: Family Medicine

## 2021-12-14 DIAGNOSIS — E1165 Type 2 diabetes mellitus with hyperglycemia: Secondary | ICD-10-CM

## 2021-12-14 MED ORDER — OZEMPIC (0.25 OR 0.5 MG/DOSE) 2 MG/3ML ~~LOC~~ SOPN: 0.5000 mg | PEN_INJECTOR | SUBCUTANEOUS | 0 refills | Status: DC

## 2021-12-15 ENCOUNTER — Telehealth (INDEPENDENT_AMBULATORY_CARE_PROVIDER_SITE_OTHER): Payer: Self-pay | Admitting: Family Medicine

## 2021-12-15 ENCOUNTER — Encounter (INDEPENDENT_AMBULATORY_CARE_PROVIDER_SITE_OTHER): Payer: Self-pay

## 2021-12-15 NOTE — Telephone Encounter (Signed)
Dr. Leafy Ro - Prior authorization approved for Ozempic. Effective: 12/14/2021 - 12/14/2024. Patient sent approval message via mychart.

## 2021-12-21 NOTE — Progress Notes (Signed)
Chief Complaint:   OBESITY Briana Ferguson is here to discuss her progress with her obesity treatment plan along with follow-up of her obesity related diagnoses. Briana Ferguson is on the Category 3 Plan and states she is following her eating plan approximately 65% of the time. Briana Ferguson states she is walking for 20 minutes 2 times per week.  Today's visit was #: 63 Starting weight: 242 lbs Starting date: 06/16/2020 Today's weight: 242 lbs Today's date: 12/13/2021 Total lbs lost to date: 0 Total lbs lost since last in-office visit: 6  Interim History: Briana Ferguson has done better with weight loss. She is doing better with eating all of the food on her plan, increasing water, and increasing activity.   Subjective:   1. Constipation, unspecified constipation type Briana Ferguson has a new diagnosis. She notes [employer] frequency with some harder and more uncomfortable.   2. Vitamin D deficiency Briana Ferguson is on Vitamin D, and she denies nausea, vomiting, or muscle weakness. Her Vitamin D level is not yet at goal.   Assessment/Plan:   1. Constipation, unspecified constipation type Briana Ferguson agreed to start OTC miralax 17 grams 1/2 dose daily, and increase her water intake.   2. Vitamin D deficiency Briana Ferguson will continue prescription Vitamin D 50,000 IU every week, and we will refill for 1 month. She will follow-up for routine testing of Vitamin D, at least 2-3 times per year to avoid over-replacement.  - Cholecalciferol 1.25 MG (50000 UT) TABS; Take 1 tablet by mouth once a week.  Dispense: 4 tablet; Refill: 0  3. Obesity, Current BMI 36.8 Briana Ferguson is currently in the action stage of change. As such, her goal is to continue with weight loss efforts. She has agreed to the Category 3 Plan.   Exercise goals: As is.   Behavioral modification strategies: increasing lean protein intake and increasing water intake.  Briana Ferguson has agreed to follow-up with our clinic in 3 weeks. She was informed of the importance of frequent follow-up visits to  maximize her success with intensive lifestyle modifications for her multiple health conditions.   Objective:   Blood pressure 116/66, pulse 64, temperature 98.6 F (37 C), height '5\' 8"'$  (1.727 m), weight 242 lb (109.8 kg), last menstrual period 04/03/2011, SpO2 99 %. Body mass index is 36.8 kg/m.  General: Cooperative, alert, well developed, in no acute distress. HEENT: Conjunctivae and lids unremarkable. Cardiovascular: Regular rhythm.  Lungs: Normal work of breathing. Neurologic: No focal deficits.   Lab Results  Component Value Date   CREATININE 0.94 09/08/2021   BUN 10 09/08/2021   NA 143 09/08/2021   K 4.3 09/08/2021   CL 104 09/08/2021   CO2 21 09/08/2021   Lab Results  Component Value Date   ALT 16 09/08/2021   AST 15 09/08/2021   ALKPHOS 91 09/08/2021   BILITOT 0.5 09/08/2021   Lab Results  Component Value Date   HGBA1C 6.3 (H) 09/08/2021   HGBA1C 6.1 (H) 01/26/2021   HGBA1C 6.0 (H) 06/16/2020   HGBA1C 5.9 (H) 08/27/2019   HGBA1C 6.2 (H) 05/07/2019   Lab Results  Component Value Date   INSULIN 22.3 09/08/2021   INSULIN 13.9 01/26/2021   INSULIN 14.1 06/16/2020   INSULIN 18.9 05/07/2019   Lab Results  Component Value Date   TSH 2.190 06/16/2020   Lab Results  Component Value Date   CHOL 176 09/08/2021   HDL 66 09/08/2021   LDLCALC 91 09/08/2021   TRIG 107 09/08/2021   CHOLHDL 3 03/19/2019   Lab  Results  Component Value Date   VD25OH 35.2 09/08/2021   VD25OH 44.8 01/26/2021   VD25OH 44.4 06/16/2020   Lab Results  Component Value Date   WBC 4.4 06/16/2020   HGB 13.1 06/16/2020   HCT 40.1 06/16/2020   MCV 87 06/16/2020   PLT 294 06/16/2020   No results found for: "IRON", "TIBC", "FERRITIN"  Attestation Statements:   Reviewed by clinician on day of visit: allergies, medications, problem list, medical history, surgical history, family history, social history, and previous encounter notes.   I, Trixie Dredge, am acting as transcriptionist  for Dennard Nip, MD.  I have reviewed the above documentation for accuracy and completeness, and I agree with the above. -  Dennard Nip, MD

## 2022-01-12 ENCOUNTER — Ambulatory Visit (INDEPENDENT_AMBULATORY_CARE_PROVIDER_SITE_OTHER): Payer: BC Managed Care – PPO | Admitting: Family Medicine

## 2022-01-18 ENCOUNTER — Encounter (INDEPENDENT_AMBULATORY_CARE_PROVIDER_SITE_OTHER): Payer: Self-pay | Admitting: Family Medicine

## 2022-01-18 ENCOUNTER — Ambulatory Visit (INDEPENDENT_AMBULATORY_CARE_PROVIDER_SITE_OTHER): Payer: BC Managed Care – PPO | Admitting: Family Medicine

## 2022-01-18 VITALS — BP 119/75 | HR 80 | Temp 98.9°F | Ht 68.0 in | Wt 239.0 lb

## 2022-01-18 DIAGNOSIS — E1165 Type 2 diabetes mellitus with hyperglycemia: Secondary | ICD-10-CM | POA: Diagnosis not present

## 2022-01-18 DIAGNOSIS — E559 Vitamin D deficiency, unspecified: Secondary | ICD-10-CM | POA: Diagnosis not present

## 2022-01-18 DIAGNOSIS — Z6836 Body mass index (BMI) 36.0-36.9, adult: Secondary | ICD-10-CM

## 2022-01-18 DIAGNOSIS — Z7985 Long-term (current) use of injectable non-insulin antidiabetic drugs: Secondary | ICD-10-CM

## 2022-01-18 DIAGNOSIS — F3289 Other specified depressive episodes: Secondary | ICD-10-CM

## 2022-01-18 DIAGNOSIS — Z8349 Family history of other endocrine, nutritional and metabolic diseases: Secondary | ICD-10-CM

## 2022-01-18 DIAGNOSIS — Z794 Long term (current) use of insulin: Secondary | ICD-10-CM

## 2022-01-18 DIAGNOSIS — E669 Obesity, unspecified: Secondary | ICD-10-CM

## 2022-01-18 MED ORDER — CHOLECALCIFEROL 1.25 MG (50000 UT) PO TABS: 1.0000 | ORAL_TABLET | ORAL | 0 refills | Status: DC

## 2022-01-18 MED ORDER — BD PEN NEEDLE NANO 2ND GEN 32G X 4 MM MISC: 1.0000 | Freq: Two times a day (BID) | 0 refills | Status: DC

## 2022-01-18 MED ORDER — OZEMPIC (0.25 OR 0.5 MG/DOSE) 2 MG/3ML ~~LOC~~ SOPN: 0.5000 mg | PEN_INJECTOR | SUBCUTANEOUS | 0 refills | Status: DC

## 2022-01-18 MED ORDER — BUPROPION HCL ER (SR) 200 MG PO TB12: 200.0000 mg | ORAL_TABLET | Freq: Every morning | ORAL | 0 refills | Status: DC

## 2022-01-19 ENCOUNTER — Telehealth (INDEPENDENT_AMBULATORY_CARE_PROVIDER_SITE_OTHER): Payer: Self-pay | Admitting: Family Medicine

## 2022-01-19 ENCOUNTER — Encounter (INDEPENDENT_AMBULATORY_CARE_PROVIDER_SITE_OTHER): Payer: Self-pay

## 2022-01-19 LAB — LIPID PANEL WITH LDL/HDL RATIO
Cholesterol, Total: 169 mg/dL (ref 100–199)
HDL: 69 mg/dL (ref >39)
LDL Chol Calc (NIH): 84 mg/dL (ref 0–99)
LDL/HDL Ratio: 1.2 ratio (ref 0.0–3.2)
Triglycerides: 90 mg/dL (ref 0–149)
VLDL Cholesterol Cal: 16 mg/dL (ref 5–40)

## 2022-01-19 LAB — INSULIN, RANDOM: INSULIN: 17.7 u[IU]/mL (ref 2.6–24.9)

## 2022-01-19 LAB — COMPREHENSIVE METABOLIC PANEL
ALT: 13 IU/L (ref 0–32)
AST: 14 IU/L (ref 0–40)
Albumin/Globulin Ratio: 1.5 (ref 1.2–2.2)
Albumin: 4.5 g/dL (ref 3.8–4.9)
Alkaline Phosphatase: 82 IU/L (ref 44–121)
BUN/Creatinine Ratio: 11 (ref 9–23)
BUN: 12 mg/dL (ref 6–24)
Bilirubin Total: 0.5 mg/dL (ref 0.0–1.2)
CO2: 24 mmol/L (ref 20–29)
Calcium: 9.2 mg/dL (ref 8.7–10.2)
Chloride: 102 mmol/L (ref 96–106)
Creatinine, Ser: 1.05 mg/dL — ABNORMAL HIGH (ref 0.57–1.00)
Globulin, Total: 3 g/dL (ref 1.5–4.5)
Glucose: 90 mg/dL (ref 70–99)
Potassium: 4.3 mmol/L (ref 3.5–5.2)
Sodium: 140 mmol/L (ref 134–144)
Total Protein: 7.5 g/dL (ref 6.0–8.5)
eGFR: 62 mL/min/{1.73_m2} (ref >59)

## 2022-01-19 LAB — HEMOGLOBIN A1C
Est. average glucose Bld gHb Est-mCnc: 123 mg/dL
Hgb A1c MFr Bld: 5.9 % — ABNORMAL HIGH (ref 4.8–5.6)

## 2022-01-19 LAB — VITAMIN D 25 HYDROXY (VIT D DEFICIENCY, FRACTURES): Vit D, 25-Hydroxy: 49.1 ng/mL (ref 30.0–100.0)

## 2022-01-19 NOTE — Telephone Encounter (Signed)
I called patient Scientist, research (physical sciences). Per [employer] - echocardiogram does not require prior authorization. Reference # [id number] - Maricar B 01/19/2022. I have called central scheduling at [phone] and have given information that prior authorization not required including reference #. Central scheduling gave me direct number [phone] for patient to call and schedule. I have sent patient a mychart message per my phone call with patient earlier today, with the information that prior authorization is not required and given direct number to call and schedule.

## 2022-01-19 NOTE — Telephone Encounter (Signed)
I returned call and patient did not have echo done yesterday 01/18/2022. Patient informed that I will contact insurance to see if PA is needed. Patient informed that I will follow up with her through mychart once I contact insurance and provide her with the number for scheduling. I will also contact scheduling once I talk to insurance to give them information if a PA is needed or not. Patient verbalize understanding and thanked me for calling.

## 2022-01-20 NOTE — Progress Notes (Signed)
Chief Complaint:   OBESITY Briana Ferguson is here to discuss her progress with her obesity treatment plan along with follow-up of her obesity related diagnoses. Briana Ferguson is on the Category 3 Plan and states she is following her eating plan approximately 45-50% of the time. Briana Ferguson states she is moving around and walking for 20 minutes/week.  Today's visit was #: 54 Starting weight: 242 lbs Starting date: 06/16/2020 Today's weight: 239 lbs Today's date: 01/18/22 Total lbs lost to date: 3 Total lbs lost since last in-office visit: -3  Interim History: Patient had surgery on September 11 so she was limited in food intake and choices.  She tried to get protein, vegetables and fruit.  She thinks she could get closely to category 3 in next few weeks.  Her hand is healing well-no complications.  Subjective:   1. Type 2 diabetes mellitus with hyperglycemia, without long-term current use of insulin (HCC) On Ozempic 0.5 mg subcu weekly. Last A1c 6.3.  2. Vitamin D deficiency On prescription vitamin D.  No nausea/vomiting/muscle weakness. Notes fatigue.  3. Family history of amyloidosis Mother just diagnosed with amyloidosis.  Per patient, her mother's cardiologist voiced concern about familial amyloidosis. Patient just had carpal tunnel surgery.  4. Other depression with emotional eating On Wellbutrin 200 mg daily. No suicidal ideation/homicidal ideation. Cravings better controlled.  Assessment/Plan:   1. Type 2 diabetes mellitus with hyperglycemia, without long-term current use of insulin (HCC) Refill: - Insulin Pen Needle (BD PEN NEEDLE NANO 2ND GEN) 32G X 4 MM MISC; 1 Package by Does not apply route 2 (two) times daily.  Dispense: 100 each; Refill: 0 - Semaglutide,0.25 or 0.'5MG'$ /DOS, (OZEMPIC, 0.25 OR 0.5 MG/DOSE,) 2 MG/3ML SOPN; Inject 0.5 mg into the skin once a week.  Dispense: 3 mL; Refill: 0 Check labs: - Comprehensive metabolic panel - Hemoglobin A1c - Insulin, random - Lipid Panel  With LDL/HDL Ratio  2. Vitamin D deficiency Refill: - Cholecalciferol 1.25 MG (50000 UT) TABS; Take 1 tablet by mouth once a week.  Dispense: 4 tablet; Refill: 0  - VITAMIN D 25 Hydroxy (Vit-D Deficiency, Fractures)  3. Family history of amyloidosis - ECHOCARDIOGRAM COMPLETE  4. Other depression with emotional eating Refill: - buPROPion (WELLBUTRIN SR) 200 MG 12 hr tablet; Take 1 tablet (200 mg total) by mouth in the morning.  Dispense: 30 tablet; Refill: 0  5. Obesity, Current BMI 36.4 Briana Ferguson is currently in the action stage of change. As such, her goal is to continue with weight loss efforts. She has agreed to the Category 3 Plan.   Exercise goals: All adults should avoid inactivity. Some physical activity is better than none, and adults who participate in any amount of physical activity gain some health benefits.  Behavioral modification strategies: increasing lean protein intake and meal planning and cooking strategies.  Briana Ferguson has agreed to follow-up with our clinic in 3-5 weeks. She was informed of the importance of frequent follow-up visits to maximize her success with intensive lifestyle modifications for her multiple health conditions.   Briana Ferguson was informed we would discuss her lab results at her next visit unless there is a critical issue that needs to be addressed sooner. Briana Ferguson agreed to keep her next visit at the agreed upon time to discuss these results.  Objective:   Blood pressure 119/75, pulse 80, temperature 98.9 F (37.2 C), height '5\' 8"'$  (1.727 m), weight 239 lb (108.4 kg), last menstrual period 04/03/2011, SpO2 96 %. Body mass index is 36.34 kg/m.  General: Cooperative, alert, well developed, in no acute distress. HEENT: Conjunctivae and lids unremarkable. Cardiovascular: Regular rhythm.  Lungs: Normal work of breathing. Neurologic: No focal deficits.   Lab Results  Component Value Date   CREATININE 1.05 (H) 01/18/2022   BUN 12 01/18/2022   NA 140 01/18/2022    K 4.3 01/18/2022   CL 102 01/18/2022   CO2 24 01/18/2022   Lab Results  Component Value Date   ALT 13 01/18/2022   AST 14 01/18/2022   ALKPHOS 82 01/18/2022   BILITOT 0.5 01/18/2022   Lab Results  Component Value Date   HGBA1C 5.9 (H) 01/18/2022   HGBA1C 6.3 (H) 09/08/2021   HGBA1C 6.1 (H) 01/26/2021   HGBA1C 6.0 (H) 06/16/2020   HGBA1C 5.9 (H) 08/27/2019   Lab Results  Component Value Date   INSULIN 17.7 01/18/2022   INSULIN 22.3 09/08/2021   INSULIN 13.9 01/26/2021   INSULIN 14.1 06/16/2020   INSULIN 18.9 05/07/2019   Lab Results  Component Value Date   TSH 2.190 06/16/2020   Lab Results  Component Value Date   CHOL 169 01/18/2022   HDL 69 01/18/2022   LDLCALC 84 01/18/2022   TRIG 90 01/18/2022   CHOLHDL 3 03/19/2019   Lab Results  Component Value Date   VD25OH 49.1 01/18/2022   VD25OH 35.2 09/08/2021   VD25OH 44.8 01/26/2021   Lab Results  Component Value Date   WBC 4.4 06/16/2020   HGB 13.1 06/16/2020   HCT 40.1 06/16/2020   MCV 87 06/16/2020   PLT 294 06/16/2020   No results found for: "IRON", "TIBC", "FERRITIN"   Attestation Statements:   Reviewed by clinician on day of visit: allergies, medications, problem list, medical history, surgical history, family history, social history, and previous encounter notes.  I, Dawn Whitmire, FNP-C, am acting as Location manager for Coralie Common, MD.  I have reviewed the above documentation for accuracy and completeness, and I agree with the above. - Coralie Common, MD

## 2022-01-24 ENCOUNTER — Other Ambulatory Visit (INDEPENDENT_AMBULATORY_CARE_PROVIDER_SITE_OTHER): Payer: Self-pay

## 2022-01-24 ENCOUNTER — Encounter (INDEPENDENT_AMBULATORY_CARE_PROVIDER_SITE_OTHER): Payer: Self-pay | Admitting: Family Medicine

## 2022-01-24 ENCOUNTER — Telehealth (INDEPENDENT_AMBULATORY_CARE_PROVIDER_SITE_OTHER): Payer: Self-pay | Admitting: Family Medicine

## 2022-01-24 ENCOUNTER — Other Ambulatory Visit (INDEPENDENT_AMBULATORY_CARE_PROVIDER_SITE_OTHER): Payer: Self-pay | Admitting: Family Medicine

## 2022-01-24 DIAGNOSIS — Z8349 Family history of other endocrine, nutritional and metabolic diseases: Secondary | ICD-10-CM

## 2022-01-24 NOTE — Telephone Encounter (Signed)
Mrs. Marikay Roads called and stated that Elvina Sidle said she is not authorize to schedule echocardiogram. She has being talking with Ardeen Fillers concerning this.  She needs someone to call her back ASAP.

## 2022-01-24 NOTE — Telephone Encounter (Signed)
I called and spoke to patient. This is not pertaining to prior authorization. Patient was told by Elvina Sidle that the order is not correct and needs to be resubmitted  for echocardiogram. . Please contact patient for further details

## 2022-01-25 ENCOUNTER — Ambulatory Visit (HOSPITAL_COMMUNITY)
Admission: RE | Admit: 2022-01-25 | Discharge: 2022-01-25 | Disposition: A | Discharge status: Home / Self Care | Payer: BC Managed Care – PPO | Source: Ambulatory Visit | Attending: Family Medicine | Admitting: Family Medicine

## 2022-01-25 DIAGNOSIS — E119 Type 2 diabetes mellitus without complications: Secondary | ICD-10-CM | POA: Insufficient documentation

## 2022-01-25 DIAGNOSIS — Z8349 Family history of other endocrine, nutritional and metabolic diseases: Secondary | ICD-10-CM | POA: Insufficient documentation

## 2022-01-25 LAB — ECHOCARDIOGRAM COMPLETE
Area-P 1/2: 2.22 cm2
S' Lateral: 2.3 cm

## 2022-01-25 NOTE — Progress Notes (Signed)
  Echocardiogram 2D Echocardiogram has been performed.  Darlina Sicilian M 01/25/2022, 3:40 PM

## 2022-01-25 NOTE — Telephone Encounter (Signed)
Echo referral was accidentally closed by [facility]. Called and spoke with Earlie Server in scheduling on 01/24/22. Referral put back in and she was going to call the pt after we hung up. Message sent to pt.

## 2022-02-02 ENCOUNTER — Ambulatory Visit (INDEPENDENT_AMBULATORY_CARE_PROVIDER_SITE_OTHER): Payer: BC Managed Care – PPO | Admitting: Family Medicine

## 2022-02-02 ENCOUNTER — Encounter (INDEPENDENT_AMBULATORY_CARE_PROVIDER_SITE_OTHER): Payer: Self-pay | Admitting: Family Medicine

## 2022-02-02 VITALS — BP 114/75 | HR 79 | Temp 98.8°F | Ht 68.0 in | Wt 243.0 lb

## 2022-02-02 DIAGNOSIS — E669 Obesity, unspecified: Secondary | ICD-10-CM | POA: Diagnosis not present

## 2022-02-02 DIAGNOSIS — Z6836 Body mass index (BMI) 36.0-36.9, adult: Secondary | ICD-10-CM

## 2022-02-02 DIAGNOSIS — E559 Vitamin D deficiency, unspecified: Secondary | ICD-10-CM | POA: Diagnosis not present

## 2022-02-02 DIAGNOSIS — E1165 Type 2 diabetes mellitus with hyperglycemia: Secondary | ICD-10-CM

## 2022-02-02 DIAGNOSIS — F3289 Other specified depressive episodes: Secondary | ICD-10-CM | POA: Diagnosis not present

## 2022-02-02 DIAGNOSIS — Z8349 Family history of other endocrine, nutritional and metabolic diseases: Secondary | ICD-10-CM

## 2022-02-02 DIAGNOSIS — Z7985 Long-term (current) use of injectable non-insulin antidiabetic drugs: Secondary | ICD-10-CM

## 2022-02-02 MED ORDER — BUPROPION HCL ER (SR) 200 MG PO TB12: 200.0000 mg | ORAL_TABLET | Freq: Every morning | ORAL | 0 refills | Status: DC

## 2022-02-02 MED ORDER — CHOLECALCIFEROL 1.25 MG (50000 UT) PO TABS: 1.0000 | ORAL_TABLET | ORAL | 0 refills | Status: DC

## 2022-02-02 MED ORDER — OZEMPIC (0.25 OR 0.5 MG/DOSE) 2 MG/3ML ~~LOC~~ SOPN: 0.5000 mg | PEN_INJECTOR | SUBCUTANEOUS | 0 refills | Status: DC

## 2022-02-09 NOTE — Progress Notes (Signed)
Chief Complaint:   OBESITY Briana Ferguson is here to discuss her progress with her obesity treatment plan along with follow-up of her obesity related diagnoses. Briana Ferguson is on the Category 3 Plan and states she is following her eating plan approximately 40% of the time. Briana Ferguson states she is walking 30 minutes 1 time per week.  Today's visit was #: 18 Starting weight: 242 lbs Starting date: 06/16/2020 Today's weight: 243 lbs Today's date: 02/02/2022 Total lbs lost to date: 0 Total lbs lost since last in-office visit: 0  Interim History: Briana Ferguson was traveling and flying recently and she had some minimal celebration eating, but she also made good choices otherwise.  Her hunger is much better controlled on GLP-1.  Subjective:   1. Type 2 diabetes mellitus with hyperglycemia, without long-term current use of insulin (HCC) Briana Ferguson is taking Ozempic 0.5 mg weekly.  Last A1c was 5.9 and insulin was 17.7, much improved.  No side effects were noted with GLP-1.  She is doing well overall with her eating plan.  I discussed labs with the patient today.  2. Vitamin D deficiency Briana Ferguson is taking vitamin D weekly with no side effects noted.  Last vitamin D level was at goal.  I discussed labs with the patient today.  3. Family history of amyloidosis Briana Ferguson's 2D echocardiogram looks normal with normal LV function.  No evidence of plaque to this or protein.  I discussed echocardiogram results with the patient today.  4. Other depression with emotional eating Briana Ferguson reports decreased cravings on Wellbutrin.  No side effects were noted.  She denies suicidal or homicidal ideations.  Assessment/Plan:   1. Type 2 diabetes mellitus with hyperglycemia, without long-term current use of insulin (HCC) We will refill Ozempic for 1 month.  Aradhya will continue her eating plan and exercise.  - Semaglutide,0.25 or 0.'5MG'$ /DOS, (OZEMPIC, 0.25 OR 0.5 MG/DOSE,) 2 MG/3ML SOPN; Inject 0.5 mg into the skin once a week.  Dispense: 3 mL;  Refill: 0  2. Vitamin D deficiency We will refill prescription Vitamin D for 1 month. Davon will follow-up for routine testing of Vitamin D, at least 2-3 times per year to avoid over-replacement.  - Cholecalciferol 1.25 MG (50000 UT) TABS; Take 1 tablet by mouth once a week.  Dispense: 4 tablet; Refill: 0  3. Family history of amyloidosis Briana Ferguson will continue with her eating plan and exercise, and she will follow-up with her PCP.  4. Other depression with emotional eating Briana Ferguson will continue her eating plan, and we will refill Wellbutrin SR for 1 month.  - buPROPion (WELLBUTRIN SR) 200 MG 12 hr tablet; Take 1 tablet (200 mg total) by mouth in the morning.  Dispense: 30 tablet; Refill: 0  5. Obesity,current BMI 36.9 Byron is currently in the action stage of change. As such, her goal is to continue with weight loss efforts. She has agreed to the Category 3 Plan.   Exercise goals: As is.   Behavioral modification strategies: increasing lean protein intake, decreasing simple carbohydrates, meal planning and cooking strategies, travel eating strategies, and planning for success.  Briana Ferguson has agreed to follow-up with our clinic in 3 weeks. She was informed of the importance of frequent follow-up visits to maximize her success with intensive lifestyle modifications for her multiple health conditions.   Objective:   Blood pressure 114/75, pulse 79, temperature 98.8 F (37.1 C), height '5\' 8"'$  (1.727 m), weight 243 lb (110.2 kg), last menstrual period 04/03/2011, SpO2 99 %. Body mass index is  36.95 kg/m.  General: Cooperative, alert, well developed, in no acute distress. HEENT: Conjunctivae and lids unremarkable. Cardiovascular: Regular rhythm.  Lungs: Normal work of breathing. Neurologic: No focal deficits.   Lab Results  Component Value Date   CREATININE 1.05 (H) 01/18/2022   BUN 12 01/18/2022   NA 140 01/18/2022   K 4.3 01/18/2022   CL 102 01/18/2022   CO2 24 01/18/2022   Lab  Results  Component Value Date   ALT 13 01/18/2022   AST 14 01/18/2022   ALKPHOS 82 01/18/2022   BILITOT 0.5 01/18/2022   Lab Results  Component Value Date   HGBA1C 5.9 (H) 01/18/2022   HGBA1C 6.3 (H) 09/08/2021   HGBA1C 6.1 (H) 01/26/2021   HGBA1C 6.0 (H) 06/16/2020   HGBA1C 5.9 (H) 08/27/2019   Lab Results  Component Value Date   INSULIN 17.7 01/18/2022   INSULIN 22.3 09/08/2021   INSULIN 13.9 01/26/2021   INSULIN 14.1 06/16/2020   INSULIN 18.9 05/07/2019   Lab Results  Component Value Date   TSH 2.190 06/16/2020   Lab Results  Component Value Date   CHOL 169 01/18/2022   HDL 69 01/18/2022   LDLCALC 84 01/18/2022   TRIG 90 01/18/2022   CHOLHDL 3 03/19/2019   Lab Results  Component Value Date   VD25OH 49.1 01/18/2022   VD25OH 35.2 09/08/2021   VD25OH 44.8 01/26/2021   Lab Results  Component Value Date   WBC 4.4 06/16/2020   HGB 13.1 06/16/2020   HCT 40.1 06/16/2020   MCV 87 06/16/2020   PLT 294 06/16/2020   No results found for: "IRON", "TIBC", "FERRITIN"  Attestation Statements:   Reviewed by clinician on day of visit: allergies, medications, problem list, medical history, surgical history, family history, social history, and previous encounter notes.   I, Trixie Dredge, am acting as transcriptionist for Dennard Nip, MD.  I have reviewed the above documentation for accuracy and completeness, and I agree with the above. -  Dennard Nip, MD

## 2022-02-15 ENCOUNTER — Other Ambulatory Visit (INDEPENDENT_AMBULATORY_CARE_PROVIDER_SITE_OTHER): Payer: Self-pay | Admitting: Family Medicine

## 2022-02-15 DIAGNOSIS — E559 Vitamin D deficiency, unspecified: Secondary | ICD-10-CM

## 2022-03-01 ENCOUNTER — Ambulatory Visit (INDEPENDENT_AMBULATORY_CARE_PROVIDER_SITE_OTHER): Payer: BC Managed Care – PPO | Admitting: Family Medicine

## 2022-03-17 ENCOUNTER — Other Ambulatory Visit (INDEPENDENT_AMBULATORY_CARE_PROVIDER_SITE_OTHER): Payer: Self-pay | Admitting: Family Medicine

## 2022-03-17 DIAGNOSIS — F3289 Other specified depressive episodes: Secondary | ICD-10-CM

## 2022-03-22 ENCOUNTER — Ambulatory Visit (INDEPENDENT_AMBULATORY_CARE_PROVIDER_SITE_OTHER): Payer: BC Managed Care – PPO | Admitting: Family Medicine

## 2022-03-29 ENCOUNTER — Ambulatory Visit (INDEPENDENT_AMBULATORY_CARE_PROVIDER_SITE_OTHER): Payer: BC Managed Care – PPO | Admitting: Family Medicine

## 2022-03-29 ENCOUNTER — Encounter (INDEPENDENT_AMBULATORY_CARE_PROVIDER_SITE_OTHER): Payer: Self-pay | Admitting: Family Medicine

## 2022-03-29 VITALS — BP 132/75 | HR 89 | Temp 98.5°F | Ht 68.0 in | Wt 249.0 lb

## 2022-03-29 DIAGNOSIS — Z7985 Long-term (current) use of injectable non-insulin antidiabetic drugs: Secondary | ICD-10-CM

## 2022-03-29 DIAGNOSIS — Z6837 Body mass index (BMI) 37.0-37.9, adult: Secondary | ICD-10-CM

## 2022-03-29 DIAGNOSIS — F3289 Other specified depressive episodes: Secondary | ICD-10-CM | POA: Diagnosis not present

## 2022-03-29 DIAGNOSIS — E559 Vitamin D deficiency, unspecified: Secondary | ICD-10-CM

## 2022-03-29 DIAGNOSIS — E1165 Type 2 diabetes mellitus with hyperglycemia: Secondary | ICD-10-CM

## 2022-03-29 DIAGNOSIS — E669 Obesity, unspecified: Secondary | ICD-10-CM | POA: Diagnosis not present

## 2022-03-29 MED ORDER — OZEMPIC (0.25 OR 0.5 MG/DOSE) 2 MG/3ML ~~LOC~~ SOPN: 0.5000 mg | PEN_INJECTOR | SUBCUTANEOUS | 0 refills | Status: DC

## 2022-03-29 MED ORDER — BUPROPION HCL ER (SR) 200 MG PO TB12: 200.0000 mg | ORAL_TABLET | Freq: Every morning | ORAL | 0 refills | Status: DC

## 2022-03-29 MED ORDER — CHOLECALCIFEROL 1.25 MG (50000 UT) PO TABS: 1.0000 | ORAL_TABLET | ORAL | 0 refills | Status: DC

## 2022-04-11 NOTE — Progress Notes (Unsigned)
Chief Complaint:   OBESITY Briana Ferguson is here to discuss her progress with her obesity treatment plan along with follow-up of her obesity related diagnoses. Briana Ferguson is on the Category 3 Plan and states she is following her eating plan approximately 40% of the time. Briana Ferguson states she is riding the stationary bike and stretching for 30 minutes 3 times per week.  Today's visit was #: 68 Starting weight: 242 lbs Starting date: 06/16/2020 Today's weight: 249 lbs Today's date: 03/29/2022 Total lbs lost to date: 0 Total lbs lost since last in-office visit: 0  Interim History: Briana Ferguson has struggled more with weight loss in the last 2 months. Her hunger has increased and she notes some increase in carbohydrate cravings. She has already started to get back on track.   Subjective:   1. Type 2 diabetes mellitus with hyperglycemia, without long-term current use of insulin (HCC) Briana Ferguson had been off Ozempic for 1 month due to pharmacy shortages. She just restarted it back at 0.25 mg this week and she noted mild GI upset, but this has improved.   2. Vitamin D deficiency Briana Ferguson stable on Vitamin D.   3. Other depression with emotional eating Briana Ferguson is working on [employer] behaviors, but she has struggled more.   Assessment/Plan:   1. Type 2 diabetes mellitus with hyperglycemia, without long-term current use of insulin (HCC) Briana Ferguson will continue Ozempic 0.5 mg once weekly, and we will refill for 1 month.   - Semaglutide,0.25 or 0.'5MG'$ /DOS, (OZEMPIC, 0.25 OR 0.5 MG/DOSE,) 2 MG/3ML SOPN; Inject 0.5 mg into the skin once a week.  Dispense: 3 mL; Refill: 0  2. Vitamin D deficiency We will refill prescription Vitamin D for 1 month. Briana Ferguson will follow-up for routine testing of Vitamin D, at least 2-3 times per year to avoid over-replacement.  - Cholecalciferol 1.25 MG (50000 UT) TABS; Take 1 tablet by mouth once a week.  Dispense: 4 tablet; Refill: 0  3. Other depression with emotional  eating Briana Ferguson will continue Wellbutrin SR 200 mg once daily, and we will refill for 1 month.   - buPROPion (WELLBUTRIN SR) 200 MG 12 hr tablet; Take 1 tablet (200 mg total) by mouth in the morning.  Dispense: 30 tablet; Refill: 0  4. Obesity, Current BMI 37.9 Briana Ferguson is currently in the action stage of change. As such, her goal is to continue with weight loss efforts. She has agreed to the Category 3 Plan.   Exercise goals: As is   Behavioral modification strategies: increasing lean protein intake.  Briana Ferguson has agreed to follow-up with our clinic in 4 weeks. She was informed of the importance of frequent follow-up visits to maximize her success with intensive lifestyle modifications for her multiple health conditions.   Objective:   Blood pressure 132/75, pulse 89, temperature 98.5 F (36.9 C), height '5\' 8"'$  (1.727 m), weight 249 lb (112.9 kg), last menstrual period 04/03/2011, SpO2 98 %. Body mass index is 37.86 kg/m.  General: Cooperative, alert, well developed, in no acute distress. HEENT: Conjunctivae and lids unremarkable. Cardiovascular: Regular rhythm.  Lungs: Normal work of breathing. Neurologic: No focal deficits.   Lab Results  Component Value Date   CREATININE 1.05 (H) 01/18/2022   BUN 12 01/18/2022   NA 140 01/18/2022   K 4.3 01/18/2022   CL 102 01/18/2022   CO2 24 01/18/2022   Lab Results  Component Value Date   ALT 13 01/18/2022   AST 14 01/18/2022   ALKPHOS 82 01/18/2022  BILITOT 0.5 01/18/2022   Lab Results  Component Value Date   HGBA1C 5.9 (H) 01/18/2022   HGBA1C 6.3 (H) 09/08/2021   HGBA1C 6.1 (H) 01/26/2021   HGBA1C 6.0 (H) 06/16/2020   HGBA1C 5.9 (H) 08/27/2019   Lab Results  Component Value Date   INSULIN 17.7 01/18/2022   INSULIN 22.3 09/08/2021   INSULIN 13.9 01/26/2021   INSULIN 14.1 06/16/2020   INSULIN 18.9 05/07/2019   Lab Results  Component Value Date   TSH 2.190 06/16/2020   Lab Results  Component Value Date   CHOL 169  01/18/2022   HDL 69 01/18/2022   LDLCALC 84 01/18/2022   TRIG 90 01/18/2022   CHOLHDL 3 03/19/2019   Lab Results  Component Value Date   VD25OH 49.1 01/18/2022   VD25OH 35.2 09/08/2021   VD25OH 44.8 01/26/2021   Lab Results  Component Value Date   WBC 4.4 06/16/2020   HGB 13.1 06/16/2020   HCT 40.1 06/16/2020   MCV 87 06/16/2020   PLT 294 06/16/2020   No results found for: "IRON", "TIBC", "FERRITIN"  Attestation Statements:   Reviewed by clinician on day of visit: allergies, medications, problem list, medical history, surgical history, family history, social history, and previous encounter notes.  I have personally spent 40 minutes total time today in preparation, patient care, and documentation for this visit, including the following: review of clinical lab tests; review of medical tests/procedures/services.   I, Trixie Dredge, am acting as transcriptionist for Dennard Nip, MD.  I have reviewed the above documentation for accuracy and completeness, and I agree with the above. -  Dennard Nip, MD

## 2022-04-14 ENCOUNTER — Other Ambulatory Visit (INDEPENDENT_AMBULATORY_CARE_PROVIDER_SITE_OTHER): Payer: Self-pay | Admitting: Family Medicine

## 2022-04-14 DIAGNOSIS — E559 Vitamin D deficiency, unspecified: Secondary | ICD-10-CM

## 2022-04-20 ENCOUNTER — Other Ambulatory Visit (INDEPENDENT_AMBULATORY_CARE_PROVIDER_SITE_OTHER): Payer: Self-pay | Admitting: Family Medicine

## 2022-04-20 DIAGNOSIS — E1165 Type 2 diabetes mellitus with hyperglycemia: Secondary | ICD-10-CM

## 2022-04-28 ENCOUNTER — Ambulatory Visit (INDEPENDENT_AMBULATORY_CARE_PROVIDER_SITE_OTHER): Payer: BC Managed Care – PPO | Admitting: Family Medicine

## 2022-04-28 ENCOUNTER — Encounter (INDEPENDENT_AMBULATORY_CARE_PROVIDER_SITE_OTHER): Payer: Self-pay | Admitting: Family Medicine

## 2022-04-28 VITALS — BP 122/73 | HR 73 | Temp 98.4°F | Ht 68.0 in | Wt 246.0 lb

## 2022-04-28 DIAGNOSIS — E669 Obesity, unspecified: Secondary | ICD-10-CM

## 2022-04-28 DIAGNOSIS — E559 Vitamin D deficiency, unspecified: Secondary | ICD-10-CM

## 2022-04-28 DIAGNOSIS — Z6837 Body mass index (BMI) 37.0-37.9, adult: Secondary | ICD-10-CM

## 2022-04-28 DIAGNOSIS — F3289 Other specified depressive episodes: Secondary | ICD-10-CM

## 2022-04-28 DIAGNOSIS — E1165 Type 2 diabetes mellitus with hyperglycemia: Secondary | ICD-10-CM

## 2022-04-28 DIAGNOSIS — Z7985 Long-term (current) use of injectable non-insulin antidiabetic drugs: Secondary | ICD-10-CM

## 2022-04-28 MED ORDER — OZEMPIC (0.25 OR 0.5 MG/DOSE) 2 MG/3ML ~~LOC~~ SOPN: 0.5000 mg | PEN_INJECTOR | SUBCUTANEOUS | 0 refills | Status: DC

## 2022-04-28 MED ORDER — BUPROPION HCL ER (SR) 200 MG PO TB12: 200.0000 mg | ORAL_TABLET | Freq: Every morning | ORAL | 0 refills | Status: DC

## 2022-04-28 MED ORDER — CHOLECALCIFEROL 1.25 MG (50000 UT) PO TABS: 1.0000 | ORAL_TABLET | ORAL | 0 refills | Status: DC

## 2022-05-10 NOTE — Progress Notes (Signed)
Chief Complaint:   OBESITY Briana Ferguson is here to discuss her progress with her obesity treatment plan along with follow-up of her obesity related diagnoses. Briana Ferguson is on the Category 3 Plan and states she is following her eating plan approximately 45% of the time. Briana Ferguson states she is doing 0 minutes 0 times per week.  Today's visit was #: 20 Starting weight: 242 lbs Starting date: 06/16/2020 Today's weight: 246 lbs Today's date: 04/28/2022 Total lbs lost to date: 0 Total lbs lost since last in-office visit: 3  Interim History: Briana Ferguson continues to do well with her weight loss.  She is on a GLP-1 and her hunger is mostly controlled.  She will be starting a Quillian Quince fast at her church and she would like to make sure this is okay for her.  Subjective:   1. Type 2 diabetes mellitus with hyperglycemia, without long-term current use of insulin (HCC) Briana Ferguson is doing well with her diet and weight loss.  No problems were noted with Ozempic.  2. Vitamin D deficiency Briana Ferguson is on vitamin D prescription, and she has no side effects noted.  3. Emotional Eating Behavior Briana Ferguson is working on decreasing emotional eating behaviors and she is doing better.  No side effects were noted with Wellbutrin.  Assessment/Plan:   1. Type 2 diabetes mellitus with hyperglycemia, without long-term current use of insulin ([facility]) Anetria will continue Ozempic, and we will refill for 1 month.  - Semaglutide,0.25 or 0.'5MG'$ /DOS, (OZEMPIC, 0.25 OR 0.5 MG/DOSE,) 2 MG/3ML SOPN; Inject 0.5 mg into the skin once a week.  Dispense: 3 mL; Refill: 0  2. Vitamin D deficiency Briana Ferguson will continue prescription vitamin D, and we will refill for 1 month.  - Cholecalciferol 1.25 MG (50000 UT) TABS; Take 1 tablet by mouth once a week.  Dispense: 4 tablet; Refill: 0  3. Emotional Eating Behavior Danielly will continue Wellbutrin SR, and we will refill for 1 month.  - buPROPion (WELLBUTRIN SR) 200 MG 12 hr tablet; Take 1 tablet (200 mg total)  by mouth in the morning.  Dispense: 30 tablet; Refill: 0  4. Obesity, Current BMI 37.4 Briana Ferguson is currently in the action stage of change. As such, her goal is to continue with weight loss efforts. She has agreed to following modified Quillian Quince fast for 3 weeks.   A modified Quillian Quince fast was discussed and handout was given.  Behavioral modification strategies: increasing lean protein intake.  Briana Ferguson has agreed to follow-up with our clinic in 3 to 4 weeks. She was informed of the importance of frequent follow-up visits to maximize her success with intensive lifestyle modifications for her multiple health conditions.   Objective:   Blood pressure 122/73, pulse 73, temperature 98.4 F (36.9 C), height '5\' 8"'$  (1.727 m), weight 246 lb (111.6 kg), last menstrual period 04/03/2011, SpO2 99 %. Body mass index is 37.4 kg/m.  General: Cooperative, alert, well developed, in no acute distress. HEENT: Conjunctivae and lids unremarkable. Cardiovascular: Regular rhythm.  Lungs: Normal work of breathing. Neurologic: No focal deficits.   Lab Results  Component Value Date   CREATININE 1.05 (H) 01/18/2022   BUN 12 01/18/2022   NA 140 01/18/2022   K 4.3 01/18/2022   CL 102 01/18/2022   CO2 24 01/18/2022   Lab Results  Component Value Date   ALT 13 01/18/2022   AST 14 01/18/2022   ALKPHOS 82 01/18/2022   BILITOT 0.5 01/18/2022   Lab Results  Component Value Date   HGBA1C 5.9 (  H) 01/18/2022   HGBA1C 6.3 (H) 09/08/2021   HGBA1C 6.1 (H) 01/26/2021   HGBA1C 6.0 (H) 06/16/2020   HGBA1C 5.9 (H) 08/27/2019   Lab Results  Component Value Date   INSULIN 17.7 01/18/2022   INSULIN 22.3 09/08/2021   INSULIN 13.9 01/26/2021   INSULIN 14.1 06/16/2020   INSULIN 18.9 05/07/2019   Lab Results  Component Value Date   TSH 2.190 06/16/2020   Lab Results  Component Value Date   CHOL 169 01/18/2022   HDL 69 01/18/2022   LDLCALC 84 01/18/2022   TRIG 90 01/18/2022   CHOLHDL 3 03/19/2019   Lab Results   Component Value Date   VD25OH 49.1 01/18/2022   VD25OH 35.2 09/08/2021   VD25OH 44.8 01/26/2021   Lab Results  Component Value Date   WBC 4.4 06/16/2020   HGB 13.1 06/16/2020   HCT 40.1 06/16/2020   MCV 87 06/16/2020   PLT 294 06/16/2020   No results found for: "IRON", "TIBC", "FERRITIN"  Attestation Statements:   Reviewed by clinician on day of visit: allergies, medications, problem list, medical history, surgical history, family history, social history, and previous encounter notes.   I, Trixie Dredge, am acting as transcriptionist for Dennard Nip, MD.  I have reviewed the above documentation for accuracy and completeness, and I agree with the above. -  Dennard Nip, MD

## 2022-05-19 ENCOUNTER — Ambulatory Visit (INDEPENDENT_AMBULATORY_CARE_PROVIDER_SITE_OTHER): Payer: BC Managed Care – PPO | Admitting: Family Medicine

## 2022-05-23 DIAGNOSIS — E669 Obesity, unspecified: Secondary | ICD-10-CM | POA: Insufficient documentation

## 2022-05-23 NOTE — Progress Notes (Unsigned)
TeleHealth Visit:  This visit was completed with telemedicine (audio/video) technology. Briana Ferguson has verbally consented to this TeleHealth visit. The patient is located at home, the provider is located at home. The participants in this visit include the listed provider and patient. The visit was conducted today via MyChart video.  OBESITY Briana Ferguson is here to discuss her progress with her obesity treatment plan along with follow-up of her obesity related diagnoses.   Today's visit was # 21 Starting weight: 242 lbs Starting date: 06/16/2020 Weight at last in office visit: 246 lbs on 04/28/22 Total weight loss: 0 lbs at last in office visit on 04/28/22. Today's reported weight: *** lbs No weight reported.  Nutrition Plan: Modified Daniel Fast.   Current exercise: {exercise types:16438} none  Interim History:  ***  Assessment/Plan:  1. ***  2. ***  3. ***  Obesity: Current BMI *** Briana Ferguson {CHL AMB IS/IS NOT:210130109} currently in the action stage of change. As such, her goal is to {MWMwtloss#1:210800005}.  She has agreed to {MWMwtlossportion/plan2:23431}.   Exercise goals: {MWM EXERCISE RECS:23473}  Behavioral modification strategies: {MWMwtlossdietstrategies3:23432}.  Briana Ferguson has agreed to follow-up with our clinic in {NUMBER 1-10:22536} weeks.   No orders of the defined types were placed in this encounter.   There are no discontinued medications.   No orders of the defined types were placed in this encounter.     Objective:   VITALS: Per patient if applicable, see vitals. GENERAL: Alert and in no acute distress. CARDIOPULMONARY: No increased WOB. Speaking in clear sentences.  PSYCH: Pleasant and cooperative. Speech normal rate and rhythm. Affect is appropriate. Insight and judgement are appropriate. Attention is focused, linear, and appropriate.  NEURO: Oriented as arrived to appointment on time with no prompting.   Lab Results  Component Value Date   CREATININE  1.05 (H) 01/18/2022   BUN 12 01/18/2022   NA 140 01/18/2022   K 4.3 01/18/2022   CL 102 01/18/2022   CO2 24 01/18/2022   Lab Results  Component Value Date   ALT 13 01/18/2022   AST 14 01/18/2022   ALKPHOS 82 01/18/2022   BILITOT 0.5 01/18/2022   Lab Results  Component Value Date   HGBA1C 5.9 (H) 01/18/2022   HGBA1C 6.3 (H) 09/08/2021   HGBA1C 6.1 (H) 01/26/2021   HGBA1C 6.0 (H) 06/16/2020   HGBA1C 5.9 (H) 08/27/2019   Lab Results  Component Value Date   INSULIN 17.7 01/18/2022   INSULIN 22.3 09/08/2021   INSULIN 13.9 01/26/2021   INSULIN 14.1 06/16/2020   INSULIN 18.9 05/07/2019   Lab Results  Component Value Date   TSH 2.190 06/16/2020   Lab Results  Component Value Date   CHOL 169 01/18/2022   HDL 69 01/18/2022   LDLCALC 84 01/18/2022   TRIG 90 01/18/2022   CHOLHDL 3 03/19/2019   Lab Results  Component Value Date   WBC 4.4 06/16/2020   HGB 13.1 06/16/2020   HCT 40.1 06/16/2020   MCV 87 06/16/2020   PLT 294 06/16/2020   No results found for: "IRON", "TIBC", "FERRITIN" Lab Results  Component Value Date   VD25OH 49.1 01/18/2022   VD25OH 35.2 09/08/2021   VD25OH 44.8 01/26/2021    Attestation Statements:   Reviewed by clinician on day of visit: allergies, medications, problem list, medical history, surgical history, family history, social history, and previous encounter notes.  ***(delete if time-based billing not used) Time spent on visit including the items listed below was *** minutes.  -preparing to see the  patient (e.g., review of tests, history, previous notes) -obtaining and/or reviewing separately obtained history -counseling and educating the patient/family/caregiver -documenting clinical information in the electronic or other health record -ordering medications, tests, or procedures -independently interpreting results and communicating results to the patient/ family/caregiver -referring and communicating with other health care  professionals  -care coordination

## 2022-05-24 ENCOUNTER — Telehealth (INDEPENDENT_AMBULATORY_CARE_PROVIDER_SITE_OTHER): Payer: BC Managed Care – PPO | Admitting: Family Medicine

## 2022-05-24 ENCOUNTER — Encounter (INDEPENDENT_AMBULATORY_CARE_PROVIDER_SITE_OTHER): Payer: Self-pay | Admitting: Family Medicine

## 2022-05-24 ENCOUNTER — Ambulatory Visit (INDEPENDENT_AMBULATORY_CARE_PROVIDER_SITE_OTHER): Payer: BC Managed Care – PPO | Admitting: Family Medicine

## 2022-05-24 DIAGNOSIS — E1165 Type 2 diabetes mellitus with hyperglycemia: Secondary | ICD-10-CM | POA: Diagnosis not present

## 2022-05-24 DIAGNOSIS — E669 Obesity, unspecified: Secondary | ICD-10-CM | POA: Diagnosis not present

## 2022-05-24 DIAGNOSIS — E559 Vitamin D deficiency, unspecified: Secondary | ICD-10-CM | POA: Diagnosis not present

## 2022-05-24 DIAGNOSIS — Z6837 Body mass index (BMI) 37.0-37.9, adult: Secondary | ICD-10-CM | POA: Diagnosis not present

## 2022-05-24 DIAGNOSIS — Z7985 Long-term (current) use of injectable non-insulin antidiabetic drugs: Secondary | ICD-10-CM

## 2022-05-24 MED ORDER — CHOLECALCIFEROL 1.25 MG (50000 UT) PO TABS: 1.0000 | ORAL_TABLET | ORAL | 0 refills | Status: DC

## 2022-05-24 MED ORDER — OZEMPIC (0.25 OR 0.5 MG/DOSE) 2 MG/3ML ~~LOC~~ SOPN: 0.5000 mg | PEN_INJECTOR | SUBCUTANEOUS | 0 refills | Status: DC

## 2022-06-17 LAB — HM MAMMOGRAPHY

## 2022-06-23 LAB — HM PAP SMEAR

## 2022-06-24 ENCOUNTER — Encounter: Payer: Self-pay | Admitting: Pulmonary Disease

## 2022-06-24 ENCOUNTER — Ambulatory Visit: Payer: BC Managed Care – PPO | Admitting: Pulmonary Disease

## 2022-06-24 VITALS — BP 112/68 | HR 94 | Ht 68.0 in | Wt 253.2 lb

## 2022-06-24 DIAGNOSIS — G4733 Obstructive sleep apnea (adult) (pediatric): Secondary | ICD-10-CM

## 2022-06-24 NOTE — Progress Notes (Signed)
Briana Ferguson    [id number]    [birth year]  Primary Care Physician:Panosh, Standley Brooking, MD  Referring Physician: Burnis Medin, [place],  [place] [address]  Chief complaint:   Patient with a history of snoring, witnessed apneas  HPI:  Patient with mild obstructive sleep apnea on CPAP therapy  Continues to benefit from CPAP therapy Using CPAP every night Waking up feeling like she is at a good nights rest  Has no significant ongoing concerns  Did discuss other options of treatment for sleep disordered breathing  Usually goes to bed about 10:30 PM, falls asleep quickly Able to fall back asleep if she were to wake up in the middle of the night Final awakening time about 7 AM  She does have daytime tiredness  Denies any headaches in the morning She does have dryness of her throat in the mornings No night sweats  She is not short of breath with normal activity  Denies any significant comorbidities  Outpatient Encounter Medications as of 06/24/2022  Medication Sig   buPROPion (WELLBUTRIN SR) 200 MG 12 hr tablet Take 1 tablet (200 mg total) by mouth in the morning.   Cholecalciferol 1.25 MG (50000 UT) capsule Take 1 capsule by mouth once a week.   fluticasone (FLONASE) 50 MCG/ACT nasal spray 2 sprays each nostril as needed   Insulin Pen Needle (BD PEN NEEDLE NANO 2ND GEN) 32G X 4 MM MISC 1 Package by Does not apply route 2 (two) times daily.   MAGNESIUM PO Take 2,600 mg by mouth as needed.   Multiple Vitamins-Minerals (CENTRUM SILVER 50+WOMEN PO) Take 1 tablet by mouth daily.   Semaglutide,0.25 or 0.'5MG'$ /DOS, (OZEMPIC, 0.25 OR 0.5 MG/DOSE,) 2 MG/3ML SOPN Inject 0.5 mg into the skin once a week.   tretinoin (RETIN-A) 0.05 % cream Apply 1 application topically at bedtime.   vitamin B-12 (CYANOCOBALAMIN) 1000 MCG tablet Take 1,000 mcg by mouth daily.   Cholecalciferol 1.25 MG (50000 UT) TABS Take 1 tablet by mouth once a week. (Patient not taking:  Reported on 06/24/2022)   clindamycin (CLEOCIN T) 1 % lotion Apply 1 application topically 2 (two) times daily. (Patient not taking: Reported on 06/24/2022)   famotidine (PEPCID) 20 MG tablet Take 1 tablet (20 mg total) by mouth 2 (two) times daily as needed for heartburn or indigestion. (Patient not taking: Reported on 06/24/2022)   spironolactone (ALDACTONE) 100 MG tablet Take 100 mg by mouth daily as needed. (Patient not taking: Reported on 06/24/2022)   No facility-administered encounter medications on file as of 06/24/2022.    Allergies as of 06/24/2022 - Review Complete 06/24/2022  Allergen Reaction Noted   Other Other (See Comments) 10/24/2019   Sulfamethoxazole-trimethoprim      Past Medical History:  Diagnosis Date   Anemia    "as a child"   Anxiety    no per pt   Arthritis    Attention and concentration deficit 2011   had med trial Dr Candis Schatz ? dx    Back pain    Bilateral carpal tunnel syndrome    Diabetes mellitus without complication (HCC)    Dyspnea    Environmental allergies    Cats   GERD (gastroesophageal reflux disease)    IBS (irritable bowel syndrome)    Joint pain    Lactose intolerance    Lower extremity edema    Multiple food allergies    Other fatigue    Prediabetes  Shortness of breath on exertion    Sleep apnea    wears CPAP   Swallowing difficulty    Tubular adenoma of colon    Vitamin D deficiency     Past Surgical History:  Procedure Laterality Date   COLONOSCOPY     NO PAST SURGERIES      Family History  Problem Relation Age of Onset   Diabetes Father    Stroke Father        deceased   Heart disease Father    Sleep apnea Father    Hypertension Father    Sudden death Father    Hyperlipidemia Father    Diabetes Mother    Stroke Mother    Sleep apnea Mother    Hypertension Mother    Heart disease Mother    Thyroid disease Mother    Obesity Mother    Hyperlipidemia Mother    Kidney disease Mother    Hyperlipidemia Other         fhx   Cancer Other        granfather/liver   Diabetes Maternal Grandmother    Heart disease Maternal Grandmother    Liver cancer Maternal Grandfather    Diabetes Paternal Grandmother    Heart disease Paternal Grandmother    Colon cancer Neg Hx    Stomach cancer Neg Hx    Pancreatic cancer Neg Hx    Esophageal cancer Neg Hx    Rectal cancer Neg Hx     Social History   Socioeconomic History   Marital status: Married    Spouse name: Ashle Seckman   Number of children: Not on file   Years of education: Not on file   Highest education level: Bachelor's degree (e.g., BA, AB, BS)  Occupational History   Occupation: TEFL teacher: [employer]  Tobacco Use   Smoking status: Never   Smokeless tobacco: Never  Vaping Use   Vaping Use: Never used  Substance and Sexual Activity   Alcohol use: No    Comment: none   Drug use: No   Sexual activity: Yes    Partners: Male  Other Topics Concern   Not on file  Social History Narrative   [name] degree  May 2011.  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      Has smoke detector and wears seat belts.  No firearms. Stored safely .Sees dentist regularly . No depression       CB x 3    Husband with vascectomy            Social Determinants of Health   Financial Resource Strain: Low Risk  (10/25/2021)   Overall Financial Resource Strain (CARDIA)    Difficulty of Paying Living Expenses: Not hard at all  Food Insecurity: No Food Insecurity (10/25/2021)   Hunger Vital Sign    Worried About Running Out of Food in the Last Year: Never true    Ran Out of Food in the Last Year: Never true  Transportation Needs: No Transportation Needs (10/25/2021)   PRAPARE - Hydrologist (Medical): No    Lack of Transportation (Non-Medical): No  Physical Activity:  Insufficiently Active (10/25/2021)   Exercise Vital Sign  Days of Exercise per Week: 3 days    Minutes of Exercise per Session: 30 min  Stress: No Stress Concern Present (10/25/2021)   [place]    Feeling of Stress : Not at all  Social Connections: Sarah [name] (10/25/2021)   Social Connection and Isolation Panel [NHANES]    Frequency of Communication with Friends and Family: More than three times a week    Frequency of Social Gatherings with Friends and Family: Twice a week    Attends Religious Services: More than 4 times per year    Active Member of [employer] or Organizations: Yes    Attends Music therapist: More than 4 times per year    Marital Status: Married  Human resources officer Violence: Not on file    Review of Systems  Constitutional: Negative.   HENT: Negative.    Eyes: Negative.   Respiratory:  Positive for apnea.   Cardiovascular: Negative.   Gastrointestinal: Negative.   Psychiatric/Behavioral:  Positive for sleep disturbance.     Vitals:   06/24/22 1504  BP: 112/68  Pulse: 94  SpO2: 100%     Physical Exam Constitutional:      Appearance: She is well-developed.  HENT:     Head: Normocephalic and atraumatic.  Eyes:     General:        Right eye: No discharge.        Left eye: No discharge.     Conjunctiva/sclera: Conjunctivae normal.     Pupils: Pupils are equal, round, and reactive to light.  Neck:     Thyroid: No thyromegaly.     Trachea: No tracheal deviation.  Cardiovascular:     Rate and Rhythm: Normal rate and regular rhythm.  Pulmonary:     Effort: Pulmonary effort is normal. No respiratory distress.     Breath sounds: No wheezing or rales.  Abdominal:     General: There is no distension.     Palpations: Abdomen is soft.     Tenderness: There is no abdominal tenderness.  Musculoskeletal:     Cervical back: Normal range of motion and neck supple.        06/29/2018    3:00 PM  Results of the [facility] flowsheet  Sitting and reading 2  Watching TV 3  Sitting, inactive in a public place (e.g. a theatre or a meeting) 0  As a passenger in a car for an hour without a break 1  Lying down to rest in the afternoon when circumstances permit 3  Sitting and talking to someone 0  Sitting quietly after a lunch without alcohol 3  In a car, while stopped for a few minutes in traffic 0  Total score 12   Compliance data reviewed showing excellent compliance with CPAP at 99% Average use of 6 hours 50 minutes Machine set between 6 and 12 95 percentile pressure of 9.7 Residual AHI 0.3  Assessment:  Mild obstructive sleep apnea on CPAP therapy  Tolerating CPAP well  Class II obesity  Plan/Recommendations:  Continue nightly CPAP  Encouraged weight loss efforts  Follow-up in 1 year  Encouraged to call with significant concerns   Sherrilyn Rist MD [facility] 06/24/2022, 3:33 PM  CC: Panosh, Standley Brooking, MD

## 2022-06-24 NOTE — Patient Instructions (Signed)
I will see you back in about 1 year  Continue using your CPAP on a nightly basis  Call us with any significant concerns  Your compliance download shows that your CPAP is working well

## 2022-06-27 ENCOUNTER — Ambulatory Visit (INDEPENDENT_AMBULATORY_CARE_PROVIDER_SITE_OTHER): Payer: BC Managed Care – PPO | Admitting: Family Medicine

## 2022-06-29 ENCOUNTER — Ambulatory Visit (INDEPENDENT_AMBULATORY_CARE_PROVIDER_SITE_OTHER): Payer: BC Managed Care – PPO | Admitting: Family Medicine

## 2022-07-03 ENCOUNTER — Other Ambulatory Visit (INDEPENDENT_AMBULATORY_CARE_PROVIDER_SITE_OTHER): Payer: Self-pay | Admitting: Family Medicine

## 2022-07-03 DIAGNOSIS — E1165 Type 2 diabetes mellitus with hyperglycemia: Secondary | ICD-10-CM

## 2022-07-20 ENCOUNTER — Ambulatory Visit (INDEPENDENT_AMBULATORY_CARE_PROVIDER_SITE_OTHER): Payer: BC Managed Care – PPO | Admitting: Family Medicine

## 2022-07-20 ENCOUNTER — Encounter (INDEPENDENT_AMBULATORY_CARE_PROVIDER_SITE_OTHER): Payer: Self-pay | Admitting: Family Medicine

## 2022-07-20 VITALS — BP 127/70 | HR 77 | Temp 98.1°F | Ht 68.0 in | Wt 250.0 lb

## 2022-07-20 DIAGNOSIS — F3289 Other specified depressive episodes: Secondary | ICD-10-CM

## 2022-07-20 DIAGNOSIS — E669 Obesity, unspecified: Secondary | ICD-10-CM | POA: Diagnosis not present

## 2022-07-20 DIAGNOSIS — Z6838 Body mass index (BMI) 38.0-38.9, adult: Secondary | ICD-10-CM

## 2022-07-20 DIAGNOSIS — E1165 Type 2 diabetes mellitus with hyperglycemia: Secondary | ICD-10-CM | POA: Diagnosis not present

## 2022-07-20 DIAGNOSIS — E559 Vitamin D deficiency, unspecified: Secondary | ICD-10-CM | POA: Diagnosis not present

## 2022-07-20 DIAGNOSIS — Z7985 Long-term (current) use of injectable non-insulin antidiabetic drugs: Secondary | ICD-10-CM

## 2022-07-20 MED ORDER — BUPROPION HCL ER (SR) 200 MG PO TB12: 200.0000 mg | ORAL_TABLET | Freq: Every morning | ORAL | 0 refills | Status: DC

## 2022-07-20 MED ORDER — OZEMPIC (0.25 OR 0.5 MG/DOSE) 2 MG/3ML ~~LOC~~ SOPN: 0.5000 mg | PEN_INJECTOR | SUBCUTANEOUS | 0 refills | Status: DC

## 2022-07-21 LAB — CMP14+EGFR
ALT: 12 IU/L (ref 0–32)
AST: 9 IU/L (ref 0–40)
Albumin/Globulin Ratio: 1.4 (ref 1.2–2.2)
Albumin: 4.3 g/dL (ref 3.8–4.9)
Alkaline Phosphatase: 89 IU/L (ref 44–121)
BUN/Creatinine Ratio: 9 (ref 9–23)
BUN: 8 mg/dL (ref 6–24)
Bilirubin Total: 0.4 mg/dL (ref 0.0–1.2)
CO2: 24 mmol/L (ref 20–29)
Calcium: 9.3 mg/dL (ref 8.7–10.2)
Chloride: 103 mmol/L (ref 96–106)
Creatinine, Ser: 0.94 mg/dL (ref 0.57–1.00)
Globulin, Total: 3.1 g/dL (ref 1.5–4.5)
Glucose: 94 mg/dL (ref 70–99)
Potassium: 4.3 mmol/L (ref 3.5–5.2)
Sodium: 140 mmol/L (ref 134–144)
Total Protein: 7.4 g/dL (ref 6.0–8.5)
eGFR: 70 mL/min/{1.73_m2} (ref >59)

## 2022-07-21 LAB — LIPID PANEL WITH LDL/HDL RATIO
Cholesterol, Total: 169 mg/dL (ref 100–199)
HDL: 68 mg/dL (ref >39)
LDL Chol Calc (NIH): 83 mg/dL (ref 0–99)
LDL/HDL Ratio: 1.2 ratio (ref 0.0–3.2)
Triglycerides: 100 mg/dL (ref 0–149)
VLDL Cholesterol Cal: 18 mg/dL (ref 5–40)

## 2022-07-21 LAB — INSULIN, RANDOM: INSULIN: 16 u[IU]/mL (ref 2.6–24.9)

## 2022-07-21 LAB — HEMOGLOBIN A1C
Est. average glucose Bld gHb Est-mCnc: 126 mg/dL
Hgb A1c MFr Bld: 6 % — ABNORMAL HIGH (ref 4.8–5.6)

## 2022-07-21 LAB — MICROALBUMIN / CREATININE URINE RATIO
Creatinine, Urine: 64.2 mg/dL
Microalb/Creat Ratio: <5 mg/g creat (ref 0–29)
Microalbumin, Urine: <3 ug/mL

## 2022-07-21 LAB — VITAMIN D 25 HYDROXY (VIT D DEFICIENCY, FRACTURES): Vit D, 25-Hydroxy: 63.8 ng/mL (ref 30.0–100.0)

## 2022-07-25 NOTE — Progress Notes (Unsigned)
Chief Complaint:   OBESITY Briana Ferguson is here to discuss her progress with her obesity treatment plan along with follow-up of her obesity related diagnoses. Briana Ferguson is on the Category 3 Plan and states she is following her eating plan approximately 40% of the time. Briana Ferguson states she is doing 0 minutes 0 times per week.  Today's visit was #: 22 Starting weight: 242 lbs Starting date: 06/16/2020 Today's weight: 250 lbs Today's date: 07/20/2022 Total lbs lost to date: 0 Total lbs lost since last in-office visit: 0  Interim History: Briana Ferguson is struggling to stay on track with her weight loss efforts.  She is having a difficult time trying to motivate herself to exercise.  Subjective:   1. Vitamin D deficiency Briana Ferguson is on vitamin D, and she is due for labs.  She denies nausea, vomiting, or muscle weakness.  2. Type 2 diabetes mellitus with hyperglycemia, without long-term current use of insulin (HCC) Briana Ferguson is on Ozempic and she is working on following her eating plan.  She is doing another Quillian Quince fast and she is trying to increase her protein.  3. Emotional Eating Behavior Briana Ferguson has been struggling more lately with emotional eating behavior.  Assessment/Plan:   1. Vitamin D deficiency We will check labs today, and we will follow-up at Suhailah's next visit.  - VITAMIN D 25 Hydroxy (Vit-D Deficiency, Fractures)  2. Type 2 diabetes mellitus with hyperglycemia, without long-term current use of insulin (HCC) We will check labs today.  Briana Ferguson will continue Ozempic, and we will get back to her category 3 meal plan when her Quillian Quince fast is over.  We will refill Ozempic for 1 month.  - Semaglutide,0.25 or 0.5MG /DOS, (OZEMPIC, 0.25 OR 0.5 MG/DOSE,) 2 MG/3ML SOPN; Inject 0.5 mg into the skin once a week.  Dispense: 3 mL; Refill: 0 - CMP14+EGFR - Insulin, random - Hemoglobin A1c - Lipid Panel With LDL/HDL Ratio - Microalbumin / creatinine urine ratio  3. Emotional Eating Behavior We will refill  Wellbutrin SR for 1 month.  Briana Ferguson was offered emotional eating behavior strategies and support.  - buPROPion (WELLBUTRIN SR) 200 MG 12 hr tablet; Take 1 tablet (200 mg total) by mouth in the morning.  Dispense: 30 tablet; Refill: 0  4. BMI 38.0-38.9,adult  5. Obesity, Beginning BMI 39.68 Briana Ferguson is currently in the action stage of change. As such, her goal is to continue with weight loss efforts. She has agreed to the Category 3 Plan.   Exercise goals: Briana Ferguson is to work on taking small steps with exercise to help her overcome her extreme dislike of exercise.  Behavioral modification strategies: increasing lean protein intake and meal planning and cooking strategies.  Briana Ferguson has agreed to follow-up with our clinic in 4 weeks. She was informed of the importance of frequent follow-up visits to maximize her success with intensive lifestyle modifications for her multiple health conditions.   Objective:   Blood pressure 127/70, pulse 77, temperature 98.1 F (36.7 C), height 5\' 8"  (1.727 m), weight 250 lb (113.4 kg), last menstrual period 04/03/2011, SpO2 97 %. Body mass index is 38.01 kg/m.  Lab Results  Component Value Date   CREATININE 0.94 07/20/2022   BUN 8 07/20/2022   NA 140 07/20/2022   K 4.3 07/20/2022   CL 103 07/20/2022   CO2 24 07/20/2022   Lab Results  Component Value Date   ALT 12 07/20/2022   AST 9 07/20/2022   ALKPHOS 89 07/20/2022   BILITOT 0.4 07/20/2022  Lab Results  Component Value Date   HGBA1C 6.0 (H) 07/20/2022   HGBA1C 5.9 (H) 01/18/2022   HGBA1C 6.3 (H) 09/08/2021   HGBA1C 6.1 (H) 01/26/2021   HGBA1C 6.0 (H) 06/16/2020   Lab Results  Component Value Date   INSULIN 16.0 07/20/2022   INSULIN 17.7 01/18/2022   INSULIN 22.3 09/08/2021   INSULIN 13.9 01/26/2021   INSULIN 14.1 06/16/2020   Lab Results  Component Value Date   TSH 2.190 06/16/2020   Lab Results  Component Value Date   CHOL 169 07/20/2022   HDL 68 07/20/2022   LDLCALC 83 07/20/2022    TRIG 100 07/20/2022   CHOLHDL 3 03/19/2019   Lab Results  Component Value Date   VD25OH 63.8 07/20/2022   VD25OH 49.1 01/18/2022   VD25OH 35.2 09/08/2021   Lab Results  Component Value Date   WBC 4.4 06/16/2020   HGB 13.1 06/16/2020   HCT 40.1 06/16/2020   MCV 87 06/16/2020   PLT 294 06/16/2020   No results found for: "IRON", "TIBC", "FERRITIN"  Attestation Statements:   Reviewed by clinician on day of visit: allergies, medications, problem list, medical history, surgical history, family history, social history, and previous encounter notes.   I, Trixie Dredge, am acting as transcriptionist for Dennard Nip, MD.  I have reviewed the above documentation for accuracy and completeness, and I agree with the above. -  Dennard Nip, MD

## 2022-08-10 ENCOUNTER — Encounter (INDEPENDENT_AMBULATORY_CARE_PROVIDER_SITE_OTHER): Payer: Self-pay | Admitting: Family Medicine

## 2022-08-10 ENCOUNTER — Ambulatory Visit (INDEPENDENT_AMBULATORY_CARE_PROVIDER_SITE_OTHER): Payer: BC Managed Care – PPO | Admitting: Family Medicine

## 2022-08-10 VITALS — BP 126/78 | HR 70 | Temp 98.0°F | Ht 68.0 in | Wt 249.0 lb

## 2022-08-10 DIAGNOSIS — Z7985 Long-term (current) use of injectable non-insulin antidiabetic drugs: Secondary | ICD-10-CM

## 2022-08-10 DIAGNOSIS — E538 Deficiency of other specified B group vitamins: Secondary | ICD-10-CM

## 2022-08-10 DIAGNOSIS — E669 Obesity, unspecified: Secondary | ICD-10-CM | POA: Diagnosis not present

## 2022-08-10 DIAGNOSIS — F3289 Other specified depressive episodes: Secondary | ICD-10-CM | POA: Diagnosis not present

## 2022-08-10 DIAGNOSIS — E1165 Type 2 diabetes mellitus with hyperglycemia: Secondary | ICD-10-CM | POA: Diagnosis not present

## 2022-08-10 DIAGNOSIS — Z6837 Body mass index (BMI) 37.0-37.9, adult: Secondary | ICD-10-CM

## 2022-08-10 MED ORDER — OZEMPIC (0.25 OR 0.5 MG/DOSE) 2 MG/3ML ~~LOC~~ SOPN: 0.5000 mg | PEN_INJECTOR | SUBCUTANEOUS | 0 refills | Status: DC

## 2022-08-10 MED ORDER — BUPROPION HCL ER (SR) 200 MG PO TB12: 200.0000 mg | ORAL_TABLET | Freq: Every morning | ORAL | 0 refills | Status: DC

## 2022-08-10 MED ORDER — VITAMIN B-12 1000 MCG PO TABS: 1000.0000 ug | ORAL_TABLET | Freq: Every day | ORAL | 0 refills | Status: DC

## 2022-08-15 NOTE — Progress Notes (Unsigned)
Chief Complaint:   OBESITY Briana Ferguson is here to discuss her progress with her obesity treatment plan along with follow-up of her obesity related diagnoses. Briana Ferguson is on the Category 3 Plan and states she is following her eating plan approximately 35% of the time. Briana Ferguson states she is riding the stationary bike and walking 1 time per week.   Today's visit was #: 23 Starting weight: 242 lbs Starting date: 06/16/2020 Today's weight: 249 lbs Today's date: 08/10/2022 Total lbs lost to date: 0 Total lbs lost since last in-office visit: 1  Interim History: Briana Ferguson has done well with increasing her exercise since her last visit.  She continues to work on following her eating plan, but she still struggles at times.  Subjective:   1. Type 2 diabetes mellitus with hyperglycemia, without long-term current use of insulin Briana Ferguson's recent A1c was 6.0.  She is stable on her diet and on Ozempic.  Her hunger is controlled, and she denies nausea or vomiting.  2. B12 deficiency Briana Ferguson is on B12 with no side effects noted, and she is due to have her labs rechecked soon.  3. Emotional Eating Behavior Briana Ferguson is doing well with decreasing emotional eating behavior, and her mood is stable.  Her blood pressure is not elevated.  Assessment/Plan:   1. Type 2 diabetes mellitus with hyperglycemia, without long-term current use of insulin Briana Ferguson will continue with her diet and Ozempic.  We will refill Ozempic for 1 month.  - Semaglutide,0.25 or 0.5MG /DOS, (OZEMPIC, 0.25 OR 0.5 MG/DOSE,) 2 MG/3ML SOPN; Inject 0.5 mg into the skin once a week.  Dispense: 3 mL; Refill: 0  2. B12 deficiency Briana Ferguson will continue B12 supplementation, and we will refill for 1 month.  We will recheck labs in 1 to 2 months.  - cyanocobalamin (VITAMIN B12) 1000 MCG tablet; Take 1 tablet (1,000 mcg total) by mouth daily.  Dispense: 30 tablet; Refill: 0  3. Emotional Eating Behavior Briana Ferguson will continue Wellbutrin SR, and we will refill for 1  month.  - buPROPion (WELLBUTRIN SR) 200 MG 12 hr tablet; Take 1 tablet (200 mg total) by mouth in the morning.  Dispense: 30 tablet; Refill: 0  4. BMI 37.0-37.9, adult  5. Obesity, Beginning BMI 39.68 Briana Ferguson is currently in the action stage of change. As such, her goal is to continue with weight loss efforts. She has agreed to the Category 3 Plan.   Exercise goals: As is.   Behavioral modification strategies: increasing lean protein intake and meal planning and cooking strategies.  Briana Ferguson has agreed to follow-up with our clinic in 4 weeks. She was informed of the importance of frequent follow-up visits to maximize her success with intensive lifestyle modifications for her multiple health conditions.   Objective:   Blood pressure 126/78, pulse 70, temperature 98 F (36.7 C), height  (1.727 m), weight 249 lb (112.9 kg), last menstrual period 04/03/2011, SpO2 99 %. Body mass index is 37.86 kg/m.  Lab Results  Component Value Date   CREATININE 0.94 07/20/2022   BUN 8 07/20/2022   NA 140 07/20/2022   K 4.3 07/20/2022   CL 103 07/20/2022   CO2 24 07/20/2022   Lab Results  Component Value Date   ALT 12 07/20/2022   AST 9 07/20/2022   ALKPHOS 89 07/20/2022   BILITOT 0.4 07/20/2022   Lab Results  Component Value Date   HGBA1C 6.0 (H) 07/20/2022   HGBA1C 5.9 (H) 01/18/2022   HGBA1C 6.3 (H) 09/08/2021  HGBA1C 6.1 (H) 01/26/2021   HGBA1C 6.0 (H) 06/16/2020   Lab Results  Component Value Date   INSULIN 16.0 07/20/2022   INSULIN 17.7 01/18/2022   INSULIN 22.3 09/08/2021   INSULIN 13.9 01/26/2021   INSULIN 14.1 06/16/2020   Lab Results  Component Value Date   TSH 2.190 06/16/2020   Lab Results  Component Value Date   CHOL 169 07/20/2022   HDL 68 07/20/2022   LDLCALC 83 07/20/2022   TRIG 100 07/20/2022   CHOLHDL 3 03/19/2019   Lab Results  Component Value Date   VD25OH 63.8 07/20/2022   VD25OH 49.1 01/18/2022   VD25OH 35.2 09/08/2021   Lab Results   Component Value Date   WBC 4.4 06/16/2020   HGB 13.1 06/16/2020   HCT 40.1 06/16/2020   MCV 87 06/16/2020   PLT 294 06/16/2020   No results found for: "IRON", "TIBC", "FERRITIN"  Attestation Statements:   Reviewed by clinician on day of visit: allergies, medications, problem list, medical history, surgical history, family history, social history, and previous encounter notes.   I, Burt Knack, am acting as transcriptionist for Quillian Quince, MD.  I have reviewed the above documentation for accuracy and completeness, and I agree with the above. -  Quillian Quince, MD

## 2022-08-18 IMAGING — DX DG CERVICAL SPINE 2 OR 3 VIEWS
3 series · 3 of 3 positions shown · non-contrast
Comparison: None.

CLINICAL DATA: Articular apathy

EXAM:
CERVICAL SPINE - 2-3 VIEW

[c-spine lat]
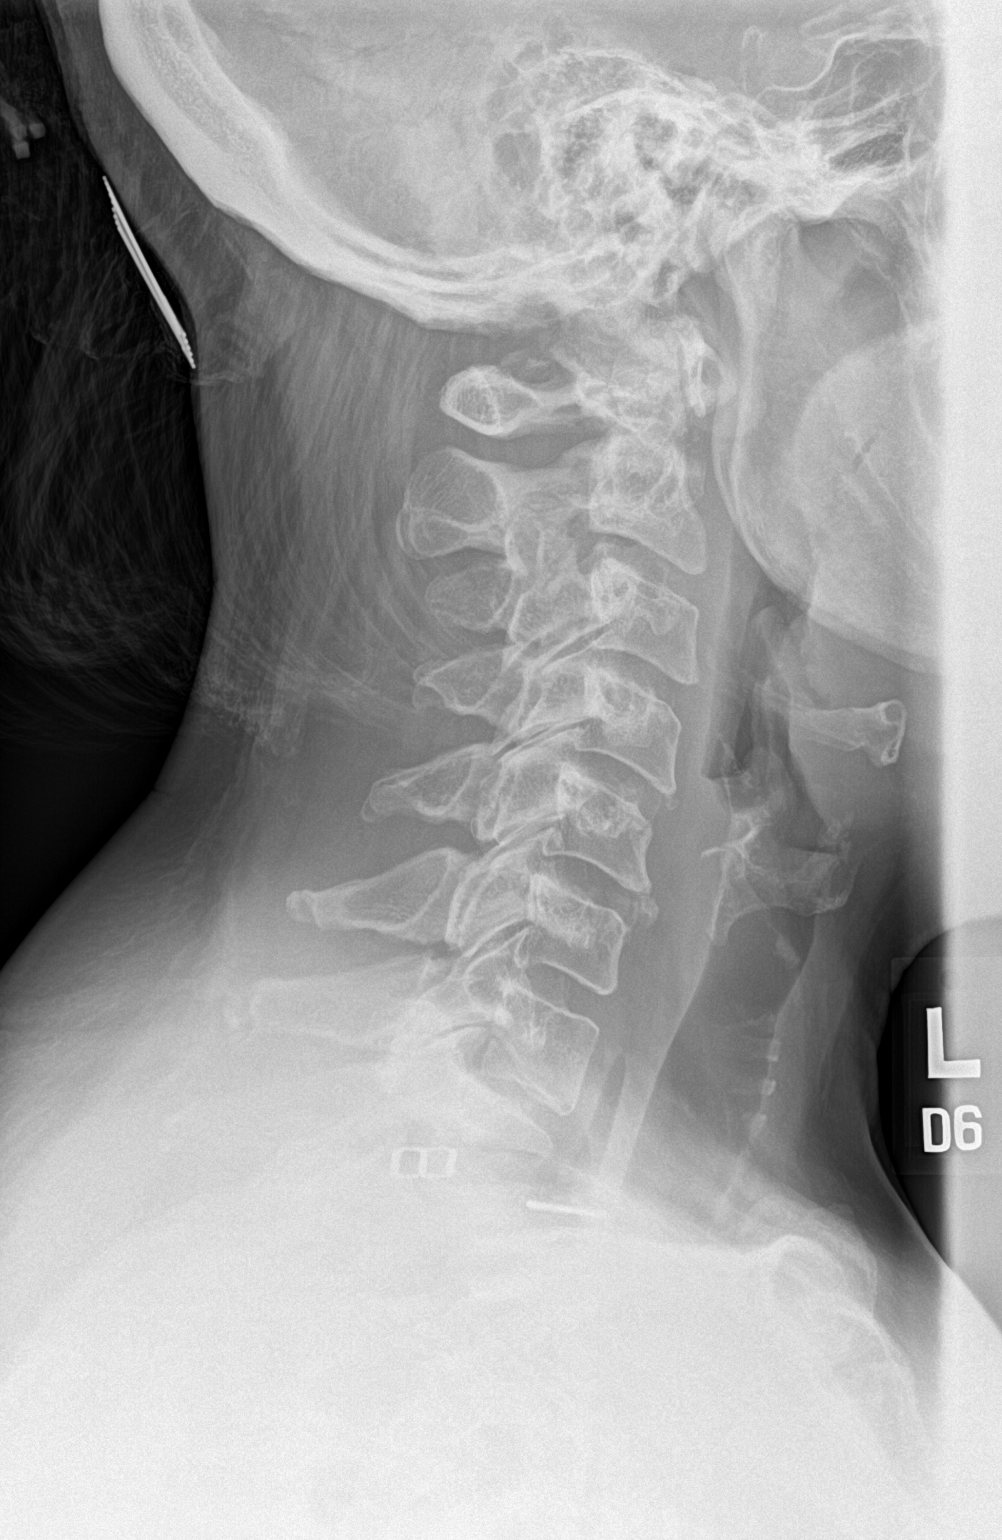

[c-spine ap]
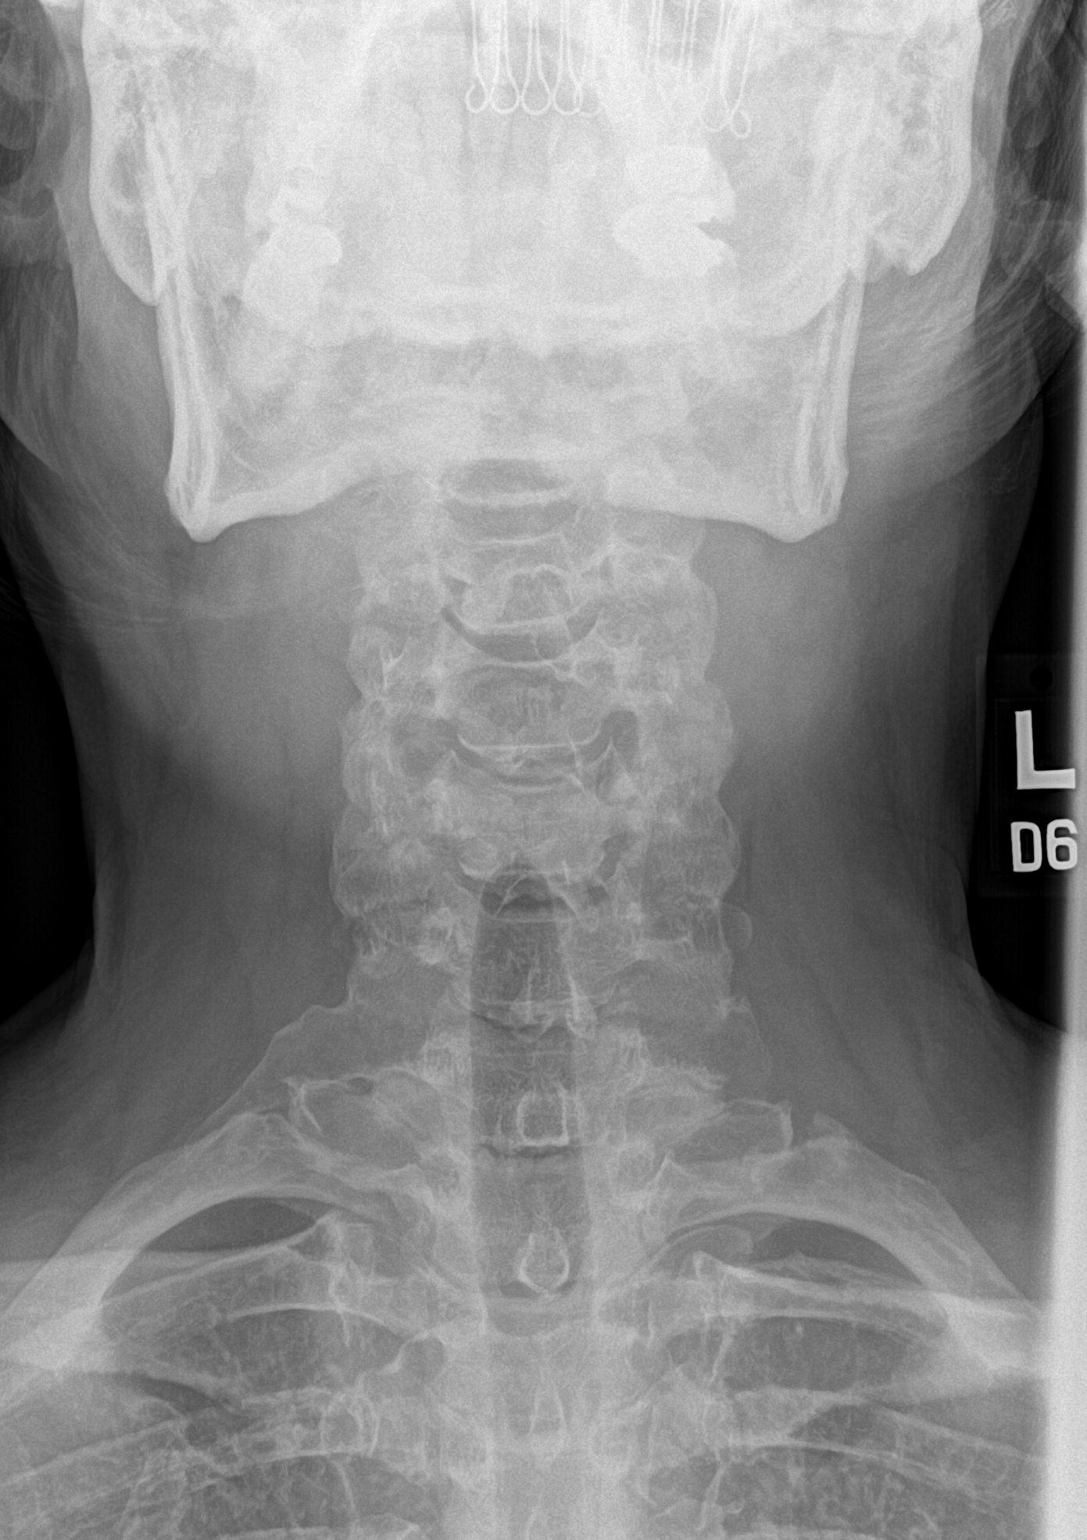

[c-spine open mouth]
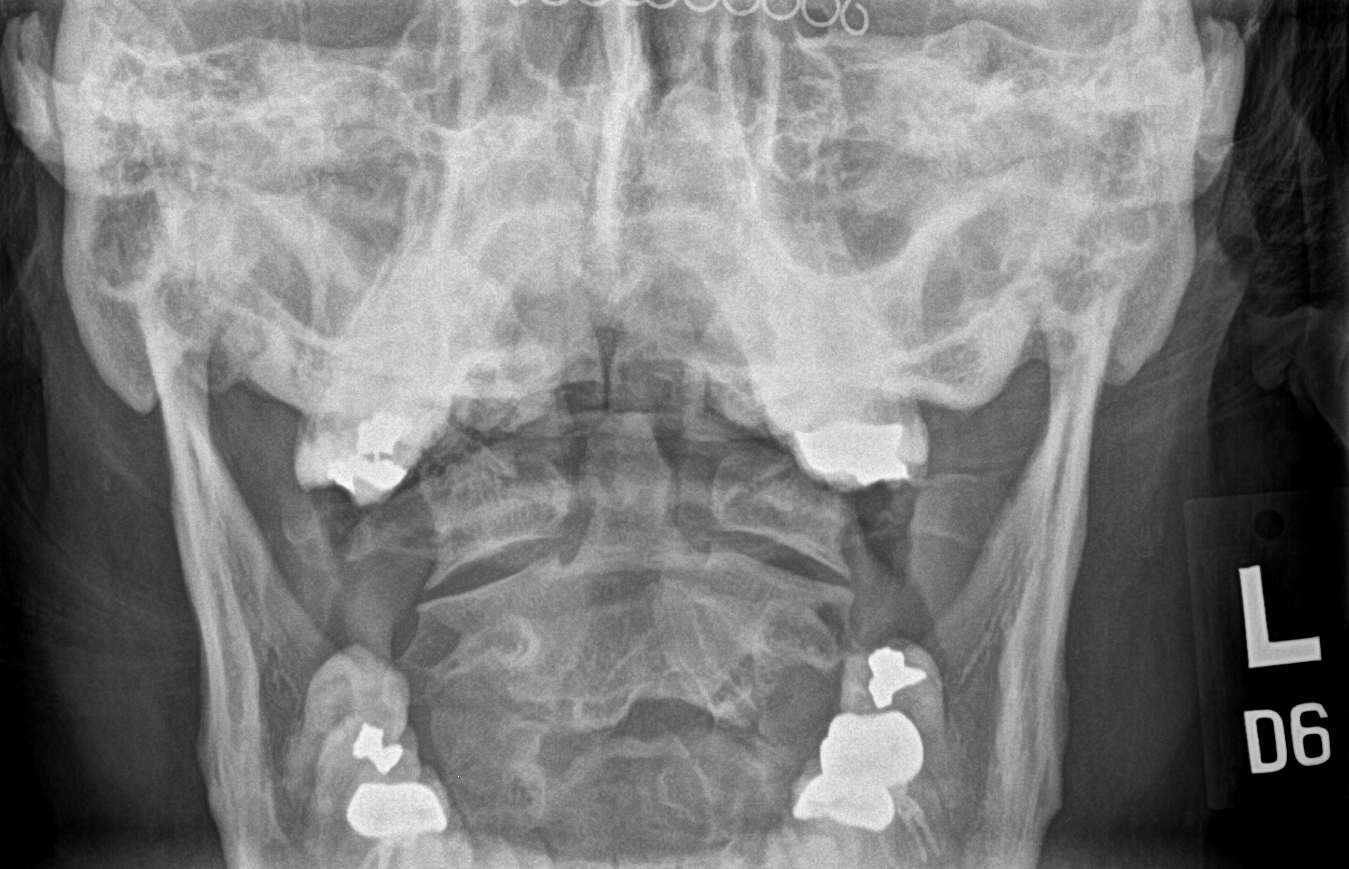

[3 of 3 positions shown; findings below may reference images not displayed]

FINDINGS: The cervical spine is visualized from C1-the superior endplate of
T1. Cervical alignment is maintained. Vertebral body heights are
maintained: no evidence of acute fracture. Intervertebral spaces are
maintained. Mild multilevel endplate proliferative changes. Mid
cervical spine facet arthropathy, mild. RIGHT-sided cervical rib at
C7. No prevertebral soft tissue swelling. Visualized thorax is
unremarkable.
IMPRESSION: 1. No evidence of acute fracture or static subluxation in the
cervical spine.
2. RIGHT-sided cervical rib at C7.

## 2022-08-30 ENCOUNTER — Other Ambulatory Visit (INDEPENDENT_AMBULATORY_CARE_PROVIDER_SITE_OTHER): Payer: Self-pay | Admitting: Family Medicine

## 2022-08-30 DIAGNOSIS — F3289 Other specified depressive episodes: Secondary | ICD-10-CM

## 2022-09-14 ENCOUNTER — Ambulatory Visit (INDEPENDENT_AMBULATORY_CARE_PROVIDER_SITE_OTHER): Payer: BC Managed Care – PPO | Admitting: Family Medicine

## 2022-09-21 ENCOUNTER — Encounter (INDEPENDENT_AMBULATORY_CARE_PROVIDER_SITE_OTHER): Payer: Self-pay | Admitting: Family Medicine

## 2022-09-21 ENCOUNTER — Ambulatory Visit (INDEPENDENT_AMBULATORY_CARE_PROVIDER_SITE_OTHER): Payer: BC Managed Care – PPO | Admitting: Family Medicine

## 2022-09-21 VITALS — BP 117/69 | HR 60 | Temp 98.3°F | Ht 68.0 in | Wt 246.0 lb

## 2022-09-21 DIAGNOSIS — E669 Obesity, unspecified: Secondary | ICD-10-CM | POA: Diagnosis not present

## 2022-09-21 DIAGNOSIS — F3289 Other specified depressive episodes: Secondary | ICD-10-CM | POA: Diagnosis not present

## 2022-09-21 DIAGNOSIS — Z6837 Body mass index (BMI) 37.0-37.9, adult: Secondary | ICD-10-CM

## 2022-09-21 DIAGNOSIS — E559 Vitamin D deficiency, unspecified: Secondary | ICD-10-CM

## 2022-09-21 DIAGNOSIS — E1165 Type 2 diabetes mellitus with hyperglycemia: Secondary | ICD-10-CM

## 2022-09-21 DIAGNOSIS — Z7985 Long-term (current) use of injectable non-insulin antidiabetic drugs: Secondary | ICD-10-CM

## 2022-09-21 MED ORDER — BUPROPION HCL ER (SR) 200 MG PO TB12: 200.0000 mg | ORAL_TABLET | Freq: Every morning | ORAL | 0 refills | Status: DC

## 2022-09-21 MED ORDER — CHOLECALCIFEROL 1.25 MG (50000 UT) PO TABS: 1.0000 | ORAL_TABLET | ORAL | 0 refills | Status: DC

## 2022-09-21 MED ORDER — OZEMPIC (0.25 OR 0.5 MG/DOSE) 2 MG/3ML ~~LOC~~ SOPN
0.5000 mg | PEN_INJECTOR | SUBCUTANEOUS | 0 refills | Status: DC
Start: 2022-09-21 — End: 2022-10-13

## 2022-09-26 NOTE — Progress Notes (Unsigned)
Chief Complaint:   OBESITY Briana Ferguson is here to discuss her progress with her obesity treatment plan along with follow-up of her obesity related diagnoses. Briana Ferguson is on the Category 3 Plan and states she is following her eating plan approximately 45% of the time. Briana Ferguson states she is walking for 15-20 minutes 1 time per week.  Today's visit was #: 24 Starting weight: 242 lbs Starting date: 06/16/2020 Today's weight: 246 lbs Today's date: 09/21/2022 Total lbs lost to date: 0 Total lbs lost since last in-office visit: 3  Interim History: Patient has done better with her weight loss.  She has worked on increasing her walking and her husband will do some walking with her.  She has made some strategies to do better during social eating situations.  Subjective:   1. Type 2 diabetes mellitus with hyperglycemia, without long-term current use of insulin (HCC) Patient is doing well on Ozempic, and she denies nausea or vomiting.  She continues to work on her diet and exercise.  2. Vitamin D deficiency Patient has missed some doses of vitamin D recently.  Her last vitamin D level was at goal.  3. Emotional Eating Behavior Patient is working on decreasing emotional eating behavior and she is doing well overall.  She has missed some doses of her Wellbutrin but she has done better with this recently.  Assessment/Plan:   1. Type 2 diabetes mellitus with hyperglycemia, without long-term current use of insulin (HCC) Patient will continue Ozempic at 0.5 mg, and we will refill for 1 month.  - Semaglutide,0.25 or 0.5MG /DOS, (OZEMPIC, 0.25 OR 0.5 MG/DOSE,) 2 MG/3ML SOPN; Inject 0.5 mg into the skin once a week.  Dispense: 3 mL; Refill: 0  2. Vitamin D deficiency Patient will continue prescription vitamin D, and we will refill for 1 month.  We will recheck labs in 2 months.  - Cholecalciferol 1.25 MG (50000 UT) TABS; Take 1 tablet by mouth once a week.  Dispense: 4 tablet; Refill: 0  3. Emotional  Eating Behavior Patient will continue Wellbutrin SR, and we will refill for 1 month.  - buPROPion (WELLBUTRIN SR) 200 MG 12 hr tablet; Take 1 tablet (200 mg total) by mouth in the morning.  Dispense: 30 tablet; Refill: 0  4. BMI 37.0-37.9, adult  5. Obesity, Beginning BMI 39.68 Briana Ferguson is currently in the action stage of change. As such, her goal is to continue with weight loss efforts. She has agreed to the Category 3 Plan.   Exercise goals: As is.   Behavioral modification strategies: increasing lean protein intake and emotional eating strategies.  Briana Ferguson has agreed to follow-up with our clinic in 3 weeks. She was informed of the importance of frequent follow-up visits to maximize her success with intensive lifestyle modifications for her multiple health conditions.   Objective:   Blood pressure 117/69, pulse 60, temperature 98.3 F (36.8 C), height 5\' 8"  (1.727 m), weight 246 lb (111.6 kg), last menstrual period 04/03/2011, SpO2 98 %. Body mass index is 37.4 kg/m.  Lab Results  Component Value Date   CREATININE 0.94 07/20/2022   BUN 8 07/20/2022   NA 140 07/20/2022   K 4.3 07/20/2022   CL 103 07/20/2022   CO2 24 07/20/2022   Lab Results  Component Value Date   ALT 12 07/20/2022   AST 9 07/20/2022   ALKPHOS 89 07/20/2022   BILITOT 0.4 07/20/2022   Lab Results  Component Value Date   HGBA1C 6.0 (H) 07/20/2022   HGBA1C  5.9 (H) 01/18/2022   HGBA1C 6.3 (H) 09/08/2021   HGBA1C 6.1 (H) 01/26/2021   HGBA1C 6.0 (H) 06/16/2020   Lab Results  Component Value Date   INSULIN 16.0 07/20/2022   INSULIN 17.7 01/18/2022   INSULIN 22.3 09/08/2021   INSULIN 13.9 01/26/2021   INSULIN 14.1 06/16/2020   Lab Results  Component Value Date   TSH 2.190 06/16/2020   Lab Results  Component Value Date   CHOL 169 07/20/2022   HDL 68 07/20/2022   LDLCALC 83 07/20/2022   TRIG 100 07/20/2022   CHOLHDL 3 03/19/2019   Lab Results  Component Value Date   VD25OH 63.8 07/20/2022    VD25OH 49.1 01/18/2022   VD25OH 35.2 09/08/2021   Lab Results  Component Value Date   WBC 4.4 06/16/2020   HGB 13.1 06/16/2020   HCT 40.1 06/16/2020   MCV 87 06/16/2020   PLT 294 06/16/2020   No results found for: "IRON", "TIBC", "FERRITIN"  Attestation Statements:   Reviewed by clinician on day of visit: allergies, medications, problem list, medical history, surgical history, family history, social history, and previous encounter notes.   I, Burt Knack, am acting as transcriptionist for Quillian Quince, MD.  I have reviewed the above documentation for accuracy and completeness, and I agree with the above. -  Quillian Quince, MD

## 2022-10-13 ENCOUNTER — Ambulatory Visit (INDEPENDENT_AMBULATORY_CARE_PROVIDER_SITE_OTHER): Payer: BC Managed Care – PPO | Admitting: Family Medicine

## 2022-10-13 ENCOUNTER — Encounter (INDEPENDENT_AMBULATORY_CARE_PROVIDER_SITE_OTHER): Payer: Self-pay | Admitting: Family Medicine

## 2022-10-13 VITALS — BP 123/77 | HR 77 | Temp 98.6°F | Ht 68.0 in | Wt 243.0 lb

## 2022-10-13 DIAGNOSIS — E669 Obesity, unspecified: Secondary | ICD-10-CM

## 2022-10-13 DIAGNOSIS — Z6836 Body mass index (BMI) 36.0-36.9, adult: Secondary | ICD-10-CM

## 2022-10-13 DIAGNOSIS — F3289 Other specified depressive episodes: Secondary | ICD-10-CM | POA: Diagnosis not present

## 2022-10-13 DIAGNOSIS — E559 Vitamin D deficiency, unspecified: Secondary | ICD-10-CM | POA: Diagnosis not present

## 2022-10-13 DIAGNOSIS — Z7985 Long-term (current) use of injectable non-insulin antidiabetic drugs: Secondary | ICD-10-CM

## 2022-10-13 DIAGNOSIS — Z6837 Body mass index (BMI) 37.0-37.9, adult: Secondary | ICD-10-CM

## 2022-10-13 DIAGNOSIS — E1165 Type 2 diabetes mellitus with hyperglycemia: Secondary | ICD-10-CM | POA: Diagnosis not present

## 2022-10-13 MED ORDER — BUPROPION HCL ER (SR) 200 MG PO TB12: 200.0000 mg | ORAL_TABLET | Freq: Every morning | ORAL | 0 refills | Status: DC

## 2022-10-13 MED ORDER — CHOLECALCIFEROL 1.25 MG (50000 UT) PO TABS: 1.0000 | ORAL_TABLET | ORAL | 0 refills | Status: DC

## 2022-10-13 MED ORDER — OZEMPIC (0.25 OR 0.5 MG/DOSE) 2 MG/3ML ~~LOC~~ SOPN: 0.5000 mg | PEN_INJECTOR | SUBCUTANEOUS | 0 refills | Status: DC

## 2022-10-18 NOTE — Progress Notes (Signed)
Chief Complaint:   OBESITY Briana Ferguson is here to discuss her progress with her obesity treatment plan along with follow-up of her obesity related diagnoses. Briana Ferguson is on the Category 3 Plan and states she is following her eating plan approximately 40% of the time. Briana Ferguson states she is doing 0 minutes 0 times per week.  Today's visit was #: 25 Starting weight: 242 lbs Starting date: 06/16/2020 Today's weight: 243 lbs Today's date: 10/13/2022 Total lbs lost to date: 0 Total lbs lost since last in-office visit: 3  Interim History: Patient has done better with meal planning and eating smarter snacks, and being more mindful of portion control.  She is working on decreasing simple carbohydrates and increasing her water intake.  Her hunger is well-controlled.  Subjective:   1. Vitamin D deficiency Patient is stable on vitamin D, and she requests a refill today.  2. Type 2 diabetes mellitus with hyperglycemia, without long-term current use of insulin (HCC) Patient is on Ozempic, and she is doing well with her diet.  She denies nausea or vomiting, or abdominal pain.  3. Emotional Eating Behavior Patient is doing well with decreasing emotional eating behavior, and no side effects were noted on Wellbutrin.  Assessment/Plan:   1. Vitamin D deficiency Patient will continue prescription vitamin D, and we will refill for 1 month.  - Cholecalciferol 1.25 MG (50000 UT) TABS; Take 1 tablet by mouth once a week.  Dispense: 4 tablet; Refill: 0  2. Type 2 diabetes mellitus with hyperglycemia, without long-term current use of insulin (HCC) Patient will continue Ozempic 0.5 mg, and we will refill for 1 month.  - Semaglutide,0.25 or 0.5MG /DOS, (OZEMPIC, 0.25 OR 0.5 MG/DOSE,) 2 MG/3ML SOPN; Inject 0.5 mg into the skin once a week.  Dispense: 3 mL; Refill: 0  3. Emotional Eating Behavior Patient will continue Wellbutrin SR, and we will refill for 1 month.  - buPROPion (WELLBUTRIN SR) 200 MG 12 hr  tablet; Take 1 tablet (200 mg total) by mouth in the morning.  Dispense: 30 tablet; Refill: 0  4. BMI 37.0-37.9, adult  5. Obesity, Beginning BMI 39.68 Briana Ferguson is currently in the action stage of change. As such, her goal is to continue with weight loss efforts. She has agreed to the Category 3 Plan.   Exercise goals: All adults should avoid inactivity. Some physical activity is better than none, and adults who participate in any amount of physical activity gain some health benefits.  Behavioral modification strategies: increasing lean protein intake and increasing water intake.  Briana Ferguson has agreed to follow-up with our clinic in 4 weeks. She was informed of the importance of frequent follow-up visits to maximize her success with intensive lifestyle modifications for her multiple health conditions.   Objective:   Blood pressure 123/77, pulse 77, temperature 98.6 F (37 C), height 5\' 8"  (1.727 m), weight 243 lb (110.2 kg), last menstrual period 04/03/2011, SpO2 99 %. Body mass index is 36.95 kg/m.  Lab Results  Component Value Date   CREATININE 0.94 07/20/2022   BUN 8 07/20/2022   NA 140 07/20/2022   K 4.3 07/20/2022   CL 103 07/20/2022   CO2 24 07/20/2022   Lab Results  Component Value Date   ALT 12 07/20/2022   AST 9 07/20/2022   ALKPHOS 89 07/20/2022   BILITOT 0.4 07/20/2022   Lab Results  Component Value Date   HGBA1C 6.0 (H) 07/20/2022   HGBA1C 5.9 (H) 01/18/2022   HGBA1C 6.3 (H) 09/08/2021  HGBA1C 6.1 (H) 01/26/2021   HGBA1C 6.0 (H) 06/16/2020   Lab Results  Component Value Date   INSULIN 16.0 07/20/2022   INSULIN 17.7 01/18/2022   INSULIN 22.3 09/08/2021   INSULIN 13.9 01/26/2021   INSULIN 14.1 06/16/2020   Lab Results  Component Value Date   TSH 2.190 06/16/2020   Lab Results  Component Value Date   CHOL 169 07/20/2022   HDL 68 07/20/2022   LDLCALC 83 07/20/2022   TRIG 100 07/20/2022   CHOLHDL 3 03/19/2019   Lab Results  Component Value Date    VD25OH 63.8 07/20/2022   VD25OH 49.1 01/18/2022   VD25OH 35.2 09/08/2021   Lab Results  Component Value Date   WBC 4.4 06/16/2020   HGB 13.1 06/16/2020   HCT 40.1 06/16/2020   MCV 87 06/16/2020   PLT 294 06/16/2020   No results found for: "IRON", "TIBC", "FERRITIN"  Attestation Statements:   Reviewed by clinician on day of visit: allergies, medications, problem list, medical history, surgical history, family history, social history, and previous encounter notes.   I, Burt Knack, am acting as transcriptionist for Quillian Quince, MD.  I have reviewed the above documentation for accuracy and completeness, and I agree with the above. -  Quillian Quince, MD

## 2022-11-15 ENCOUNTER — Telehealth (INDEPENDENT_AMBULATORY_CARE_PROVIDER_SITE_OTHER): Payer: BC Managed Care – PPO | Admitting: Family Medicine

## 2022-11-15 ENCOUNTER — Encounter (INDEPENDENT_AMBULATORY_CARE_PROVIDER_SITE_OTHER): Payer: Self-pay | Admitting: Family Medicine

## 2022-11-15 ENCOUNTER — Other Ambulatory Visit (INDEPENDENT_AMBULATORY_CARE_PROVIDER_SITE_OTHER): Payer: Self-pay | Admitting: Family Medicine

## 2022-11-15 DIAGNOSIS — E559 Vitamin D deficiency, unspecified: Secondary | ICD-10-CM

## 2022-11-15 DIAGNOSIS — Z6837 Body mass index (BMI) 37.0-37.9, adult: Secondary | ICD-10-CM | POA: Diagnosis not present

## 2022-11-15 DIAGNOSIS — E1165 Type 2 diabetes mellitus with hyperglycemia: Secondary | ICD-10-CM

## 2022-11-15 DIAGNOSIS — E669 Obesity, unspecified: Secondary | ICD-10-CM | POA: Diagnosis not present

## 2022-11-15 DIAGNOSIS — Z7985 Long-term (current) use of injectable non-insulin antidiabetic drugs: Secondary | ICD-10-CM

## 2022-11-15 MED ORDER — CHOLECALCIFEROL 1.25 MG (50000 UT) PO TABS: 1.0000 | ORAL_TABLET | ORAL | 0 refills | Status: DC

## 2022-11-15 MED ORDER — OZEMPIC (0.25 OR 0.5 MG/DOSE) 2 MG/3ML ~~LOC~~ SOPN: 0.5000 mg | PEN_INJECTOR | SUBCUTANEOUS | 0 refills | Status: DC

## 2022-11-15 NOTE — Progress Notes (Unsigned)
TeleHealth Visit:  Due to the COVID-19 pandemic, this visit was completed with telemedicine (audio/video) technology to reduce patient and provider exposure as well as to preserve personal protective equipment.   Briana Ferguson has verbally consented to this TeleHealth visit. The patient is located at home, the provider is located at the [facility] office. The participants in this visit include the listed provider and patient. The visit was conducted today via MyChart video.   Chief Complaint: OBESITY Briana Ferguson is here to discuss her progress with her obesity treatment plan along with follow-up of her obesity related diagnoses. Briana Ferguson is on the Category 3 Plan and states she is following her eating plan approximately 45% of the time. Briana Ferguson states she is walking for 15 minutes 2 times per week.  Today's visit was #: 26 Starting weight: 242 lbs Starting date: 06/16/2020  Interim History: Patient feels she is doing well with eating healthier.  She feels her clothes are fitting better and she is mindful of her food choices.  Her hunger is mostly controlled.  Subjective:   1. Vitamin D deficiency Patient's recent vitamin D level was at goal.  She is due for labs soon.  2. Type 2 diabetes mellitus with hyperglycemia, without long-term current use of insulin (HCC) Patient's recent A1c was at 6.0.  She is doing well with her diet and exercise.  Her polyphagia has improved.  Assessment/Plan:   1. Vitamin D deficiency Patient will continue prescription vitamin D once weekly, and we will refill for 1 month.  We will recheck labs in 1 month.  - Cholecalciferol 1.25 MG (50000 UT) TABS; Take 1 tablet by mouth once a week.  Dispense: 4 tablet; Refill: 0  2. Type 2 diabetes mellitus with hyperglycemia, without long-term current use of insulin (HCC) Patient will continue Ozempic at 0.5 mg once weekly, and we will refill for 1 month.  We will recheck labs in 1 month.  - Semaglutide,0.25 or  0.5MG /DOS, (OZEMPIC, 0.25 OR 0.5 MG/DOSE,) 2 MG/3ML SOPN; Inject 0.5 mg into the skin once a week.  Dispense: 3 mL; Refill: 0  3. BMI 37.0-37.9, adult  4. Obesity, Beginning BMI 39.68 Briana Ferguson is currently in the action stage of change. As such, her goal is to continue with weight loss efforts. She has agreed to the Category 3 Plan.   Exercise goals: As is.   Behavioral modification strategies: no skipping meals.  Briana Ferguson has agreed to follow-up with our clinic in 3 to 4 weeks. She was informed of the importance of frequent follow-up visits to maximize her success with intensive lifestyle modifications for her multiple health conditions.  Objective:   VITALS: Per patient if applicable, see vitals. GENERAL: Alert and in no acute distress. CARDIOPULMONARY: No increased WOB. Speaking in clear sentences.  PSYCH: Pleasant and cooperative. Speech normal rate and rhythm. Affect is appropriate. Insight and judgement are appropriate. Attention is focused, linear, and appropriate.  NEURO: Oriented as arrived to appointment on time with no prompting.   Lab Results  Component Value Date   CREATININE 0.94 07/20/2022   BUN 8 07/20/2022   NA 140 07/20/2022   K 4.3 07/20/2022   CL 103 07/20/2022   CO2 24 07/20/2022   Lab Results  Component Value Date   ALT 12 07/20/2022   AST 9 07/20/2022   ALKPHOS 89 07/20/2022   BILITOT 0.4 07/20/2022   Lab Results  Component Value Date   HGBA1C 6.0 (H) 07/20/2022   HGBA1C 5.9 (H) 01/18/2022  HGBA1C 6.3 (H) 09/08/2021   HGBA1C 6.1 (H) 01/26/2021   HGBA1C 6.0 (H) 06/16/2020   Lab Results  Component Value Date   INSULIN 16.0 07/20/2022   INSULIN 17.7 01/18/2022   INSULIN 22.3 09/08/2021   INSULIN 13.9 01/26/2021   INSULIN 14.1 06/16/2020   Lab Results  Component Value Date   TSH 2.190 06/16/2020   Lab Results  Component Value Date   CHOL 169 07/20/2022   HDL 68 07/20/2022   LDLCALC 83 07/20/2022   TRIG 100 07/20/2022   CHOLHDL 3 03/19/2019    Lab Results  Component Value Date   VD25OH 63.8 07/20/2022   VD25OH 49.1 01/18/2022   VD25OH 35.2 09/08/2021   Lab Results  Component Value Date   WBC 4.4 06/16/2020   HGB 13.1 06/16/2020   HCT 40.1 06/16/2020   MCV 87 06/16/2020   PLT 294 06/16/2020   No results found for: "IRON", "TIBC", "FERRITIN"  Attestation Statements:   Reviewed by clinician on day of visit: allergies, medications, problem list, medical history, surgical history, family history, social history, and previous encounter notes.   I, Burt Knack, am acting as transcriptionist for Quillian Quince, MD.  I have reviewed the above documentation for accuracy and completeness, and I agree with the above. - Quillian Quince, MD

## 2022-12-01 ENCOUNTER — Ambulatory Visit (INDEPENDENT_AMBULATORY_CARE_PROVIDER_SITE_OTHER): Payer: BC Managed Care – PPO | Admitting: Family Medicine

## 2022-12-07 ENCOUNTER — Ambulatory Visit (INDEPENDENT_AMBULATORY_CARE_PROVIDER_SITE_OTHER): Payer: BC Managed Care – PPO | Admitting: Family Medicine

## 2022-12-08 ENCOUNTER — Encounter (INDEPENDENT_AMBULATORY_CARE_PROVIDER_SITE_OTHER): Payer: Self-pay

## 2022-12-27 NOTE — Progress Notes (Signed)
Chief Complaint  Patient presents with   Annual Exam    HPI: Patient  Briana Ferguson Ferguson  62 y.o. comes in today for Preventive Health Care visit   Dm managemd by dr Briana Ferguson Ferguson with weight management  on glp1 med  high dose vit d b12  OSA pulm cpap compliant  Recently not paying as much attention and gained some weight back  No major change in health otherwise   Health Maintenance  Topic Date Due   FOOT EXAM  Never done   INFLUENZA VACCINE  11/24/2022   COVID-19 Vaccine (7 - 2023-24 season) 01/13/2023 (Originally 12/25/2022)   PAP SMEAR-Modifier  03/29/2023 (Originally 05/13/2022)   HIV Screening  12/28/2023 (Originally 03/20/1976)   HEMOGLOBIN A1C  01/20/2023   MAMMOGRAM  06/24/2023   OPHTHALMOLOGY EXAM  06/27/2023   Diabetic kidney evaluation - eGFR measurement  07/20/2023   Diabetic kidney evaluation - Urine ACR  07/20/2023   DTaP/Tdap/Td (4 - Td or Tdap) 02/04/2027   Colonoscopy  03/11/2030   Hepatitis C Screening  Completed   Zoster Vaccines- Shingrix  Completed   HPV VACCINES  Aged Out   Health Maintenance Review LIFESTYLE:  Exercise:  : low to some   trying to increase activity.  Tobacco/ETS:  no Alcohol: no Sugar beverages:  Sleep: about 6-7  cpap helpful Drug use: no HH of   2 no pets  Work:  40 hours  2 remote .    ROS:  REST of 12 system review negative except as per HPI no cv  pulmonary neuro .  Cts not amyloid    Past Medical History:  Diagnosis Date   Anemia    "as a child"   Anxiety    no per pt   Arthritis    Attention and concentration deficit 2011   had med trial Dr Briana Ferguson Ferguson ? dx    Back pain    Bilateral carpal tunnel syndrome    Diabetes mellitus without complication (HCC)    Dyspnea    Environmental allergies    Cats   GERD (gastroesophageal reflux disease)    IBS (irritable bowel syndrome)    Joint pain    Lactose intolerance    Lower extremity edema    Multiple food allergies    Other fatigue    Prediabetes    Shortness of breath  on exertion    Sleep apnea    wears CPAP   Swallowing difficulty    Tubular adenoma of colon    Vitamin D deficiency     Past Surgical History:  Procedure Laterality Date   COLONOSCOPY     NO PAST SURGERIES      Family History  Problem Relation Age of Onset   Diabetes Father    Stroke Father        deceased   Heart disease Father    Sleep apnea Father    Hypertension Father    Sudden death Father    Hyperlipidemia Father    Diabetes Mother    Stroke Mother    Sleep apnea Mother    Hypertension Mother    Heart disease Mother    Thyroid disease Mother    Obesity Mother    Hyperlipidemia Mother    Kidney disease Mother    Hyperlipidemia Other        fhx   Cancer Other        granfather/liver   Diabetes Maternal Grandmother    Heart disease Maternal Grandmother  Liver cancer Maternal Grandfather    Diabetes Paternal Grandmother    Heart disease Paternal Grandmother    Colon cancer Neg Hx    Stomach cancer Neg Hx    Pancreatic cancer Neg Hx    Esophageal cancer Neg Hx    Rectal cancer Neg Hx     Social History   Socioeconomic History   Marital status: Married    Spouse name: Briana Ferguson Ferguson   Number of children: Not on file   Years of education: Not on file   Highest education level: Bachelor's degree (e.g., BA, AB, BS)  Occupational History   Occupation: Electrical engineer: [employer]  Tobacco Use   Smoking status: Never   Smokeless tobacco: Never  Vaping Use   Vaping status: Never Used  Substance and Sexual Activity   Alcohol use: No    Comment: none   Drug use: No   Sexual activity: Yes    Partners: Male  Other Topics Concern   Not on file  Social History Narrative   Briana Ferguson Ferguson degree  May 2011.  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   4  Daughter husband and m in Risk analyst  Outside pet.      Has smoke detector and wears seat belts.  No  firearms. Stored safely .Sees dentist regularly . No depression       CB x 3    Husband with vascectomy            Social Determinants of Health   Financial Resource Strain: Low Risk  (12/28/2022)   Overall Financial Resource Strain (CARDIA)    Difficulty of Paying Living Expenses: Not very hard  Food Insecurity: No Food Insecurity (12/28/2022)   Hunger Vital Sign    Worried About Running Out of Food in the Last Year: Never true    Ran Out of Food in the Last Year: Never true  Transportation Needs: No Transportation Needs (12/28/2022)   PRAPARE - Administrator, Civil Service (Medical): No    Lack of Transportation (Non-Medical): No  Physical Activity: Inactive (12/28/2022)   Exercise Vital Sign    Days of Exercise per Week: 0 days    Minutes of Exercise per Session: 0 min  Stress: No Stress Concern Present (12/28/2022)   [employer] of Occupational Health - Occupational Stress Questionnaire    Feeling of Stress : Only a little  Social Connections: Socially Integrated (12/28/2022)   Social Connection and Isolation Panel [NHANES]    Frequency of Communication with Friends and Family: More than three times a week    Frequency of Social Gatherings with Friends and Family: Once a week    Attends Religious Services: More than 4 times per year    Active Member of [employer] or Organizations: Yes    Attends Engineer, structural: More than 4 times per year    Marital Status: Married    Outpatient Medications Prior to Visit  Medication Sig Dispense Refill   buPROPion (WELLBUTRIN SR) 200 MG 12 hr tablet Take 1 tablet (200 mg total) by mouth in the morning. 30 tablet 0   Cholecalciferol 1.25 MG (50000 UT) TABS Take 1 tablet by mouth once a week. 4 tablet 0   cyanocobalamin (VITAMIN B12) 1000 MCG tablet Take 1 tablet (1,000 mcg total)  by mouth daily. 30 tablet 0   fluticasone (FLONASE) 50 MCG/ACT nasal spray 2 sprays each nostril as needed 16 g 0   MAGNESIUM PO Take 2,600 mg  by mouth as needed.     Multiple Vitamins-Minerals (CENTRUM SILVER 50+WOMEN PO) Take 1 tablet by mouth daily.     Semaglutide,0.25 or 0.5MG /DOS, (OZEMPIC, 0.25 OR 0.5 MG/DOSE,) 2 MG/3ML SOPN Inject 0.5 mg into the skin once a week. 3 mL 0   tretinoin (RETIN-A) 0.05 % cream Apply 1 application topically at bedtime.     HYDROcodone-acetaminophen (NORCO) 7.5-325 MG tablet Take 1 tablet by mouth every 6 (six) hours as needed.     ketorolac (TORADOL) 10 MG tablet Take 10 mg by mouth every 6 (six) hours as needed.     spironolactone (ALDACTONE) 100 MG tablet Take 100 mg by mouth daily as needed.     No facility-administered medications prior to visit.     EXAM:  BP 110/68 (BP Location: Left Arm, Patient Position: Sitting, Cuff Size: Large)   Pulse 81   Temp 98.3 F (36.8 C) (Oral)   Ht 5' 7.9" (1.725 m)   Wt 253 lb 3.2 oz (114.9 kg)   LMP 04/03/2011   SpO2 98%   BMI 38.61 kg/m   Body mass index is 38.61 kg/m. Wt Readings from Last 3 Encounters:  12/28/22 253 lb 3.2 oz (114.9 kg)  10/13/22 243 lb (110.2 kg)  09/21/22 246 lb (111.6 kg)    Physical Exam: Vital signs reviewed AOZ:HYQM is a well-developed well-nourished alert cooperative    who appearsr stated age in no acute distress.  HEENT: normocephalic atraumatic , Eyes: PERRL EOM's full, conjunctiva clear, Nares: paten,t no deformity discharge or tenderness., Ears: no deformity EAC's clear TMs with normal landmarks. Mouth: clear OP, no lesions, edema.  Moist mucous membranes. Dentition in adequate repair. NECK: supple without masses, thyromegaly or bruits. CHEST/PULM:  Clear to auscultation and percussion breath sounds equal no wheeze , rales or rhonchi. No chest wall deformities or tenderness. Breast: normal by inspection . No dimpling, discharge, masses, tenderness or discharge . CV: PMI is nondisplaced, S1 S2 no gallops, murmurs, rubs. Peripheral pulses are full without delay.No JVD .  ABDOMEN: Bowel sounds normal nontender  No  guard or rebound, no hepato splenomegal no CVA tenderness.  Extremtities:  No clubbing cyanosis or edema, no acute joint swelling or redness no focal atrophy NEURO:  Oriented x3, cranial nerves 3-12 appear to be intact, no obvious focal weakness,gait within normal limits no abnormal reflexes or asymmetrical SKIN: No acute rashes normal turgor, color, no bruising or petechiae. PSYCH: Oriented, good eye contact, no obvious depression anxiety, cognition and judgment appear normal. LN: no cervical axillary adenopathy  Lab Results  Component Value Date   WBC 4.4 06/16/2020   HGB 13.1 06/16/2020   HCT 40.1 06/16/2020   PLT 294 06/16/2020   GLUCOSE 94 07/20/2022   CHOL 169 07/20/2022   TRIG 100 07/20/2022   HDL 68 07/20/2022   LDLCALC 83 07/20/2022   ALT 12 07/20/2022   AST 9 07/20/2022   NA 140 07/20/2022   K 4.3 07/20/2022   CL 103 07/20/2022   CREATININE 0.94 07/20/2022   BUN 8 07/20/2022   CO2 24 07/20/2022   TSH 2.190 06/16/2020   HGBA1C 6.0 (H) 07/20/2022    BP Readings from Last 3 Encounters:  12/28/22 110/68  10/13/22 123/77  09/21/22 117/69    Lab last results reviewed with patient   ASSESSMENT AND  PLAN:  Discussed the following assessment and plan:    ICD-10-CM   1. Visit for preventive health examination  Z00.00     2. Medication management  Z79.899     3. Type 2 diabetes mellitus with other specified complication, without long-term current use of insulin (HCC)  E11.69    managmed but weight managment    4. OSA (obstructive sleep apnea)  G47.33     5. Hyperlipidemia associated with type 2 diabetes mellitus (HCC)  E11.69    E78.5     6. Family history of amyloidosis  Z83.49      Lab per weight management .   No recent cbc tsh seen .   To have lab tomorrow .Marland Kitchen  Disc and counsel about getting back on   Return in about 1 year (around 12/28/2023).  Patient Care Team: Jayelyn Barno, Neta Mends, MD as PCP - General (Internal Medicine) Kirkland Hun, MD (Obstetrics  and Gynecology) Rhea Belton, Carie Caddy, MD as Consulting Physician (Gastroenterology) Center, [facility] Henreitta Leber, [place] as Physician Assistant (Obstetrics and Gynecology) Patient Instructions  Good to see you today .  Please track   what  you are doing to help weight management .   Increase activity as we discussed .  Get flu vaccine this fall . Otc Flonase is   ok. It looks like  weight management is doing lab monitoring . Let us know if we need to help .  Cannot tell when last cbc ad thyroid was done.     Neta Mends. Menno Vanbergen M.D.

## 2022-12-28 ENCOUNTER — Ambulatory Visit (INDEPENDENT_AMBULATORY_CARE_PROVIDER_SITE_OTHER): Payer: BC Managed Care – PPO | Admitting: Internal Medicine

## 2022-12-28 VITALS — BP 110/68 | HR 81 | Temp 98.3°F | Ht 67.9 in | Wt 253.2 lb

## 2022-12-28 DIAGNOSIS — Z79899 Other long term (current) drug therapy: Secondary | ICD-10-CM

## 2022-12-28 DIAGNOSIS — E1169 Type 2 diabetes mellitus with other specified complication: Secondary | ICD-10-CM

## 2022-12-28 DIAGNOSIS — G4733 Obstructive sleep apnea (adult) (pediatric): Secondary | ICD-10-CM | POA: Diagnosis not present

## 2022-12-28 DIAGNOSIS — Z0000 Encounter for general adult medical examination without abnormal findings: Secondary | ICD-10-CM

## 2022-12-28 DIAGNOSIS — E785 Hyperlipidemia, unspecified: Secondary | ICD-10-CM

## 2022-12-28 DIAGNOSIS — Z8349 Family history of other endocrine, nutritional and metabolic diseases: Secondary | ICD-10-CM

## 2022-12-28 DIAGNOSIS — Z7985 Long-term (current) use of injectable non-insulin antidiabetic drugs: Secondary | ICD-10-CM

## 2022-12-28 NOTE — Patient Instructions (Addendum)
Good to see you today .  Please track   what  you are doing to help weight management .   Increase activity as we discussed .  Get flu vaccine this fall . Otc Flonase is   ok. It looks like  weight management is doing lab monitoring . Let us know if we need to help .  Cannot tell when last cbc ad thyroid was done.

## 2022-12-29 ENCOUNTER — Ambulatory Visit (INDEPENDENT_AMBULATORY_CARE_PROVIDER_SITE_OTHER): Payer: BC Managed Care – PPO | Admitting: Family Medicine

## 2022-12-29 ENCOUNTER — Encounter (INDEPENDENT_AMBULATORY_CARE_PROVIDER_SITE_OTHER): Payer: Self-pay | Admitting: Family Medicine

## 2022-12-29 ENCOUNTER — Other Ambulatory Visit (INDEPENDENT_AMBULATORY_CARE_PROVIDER_SITE_OTHER): Payer: Self-pay | Admitting: Family Medicine

## 2022-12-29 VITALS — BP 110/70 | HR 68 | Temp 97.5°F | Ht 68.0 in | Wt 248.0 lb

## 2022-12-29 DIAGNOSIS — F3289 Other specified depressive episodes: Secondary | ICD-10-CM

## 2022-12-29 DIAGNOSIS — Z6837 Body mass index (BMI) 37.0-37.9, adult: Secondary | ICD-10-CM

## 2022-12-29 DIAGNOSIS — E538 Deficiency of other specified B group vitamins: Secondary | ICD-10-CM | POA: Diagnosis not present

## 2022-12-29 DIAGNOSIS — E1169 Type 2 diabetes mellitus with other specified complication: Secondary | ICD-10-CM | POA: Diagnosis not present

## 2022-12-29 DIAGNOSIS — E559 Vitamin D deficiency, unspecified: Secondary | ICD-10-CM | POA: Diagnosis not present

## 2022-12-29 DIAGNOSIS — E669 Obesity, unspecified: Secondary | ICD-10-CM

## 2022-12-29 DIAGNOSIS — Z7985 Long-term (current) use of injectable non-insulin antidiabetic drugs: Secondary | ICD-10-CM

## 2022-12-29 MED ORDER — CHOLECALCIFEROL 1.25 MG (50000 UT) PO TABS
1.0000 | ORAL_TABLET | ORAL | 0 refills | Status: DC
Start: 2022-12-29 — End: 2024-02-19

## 2022-12-29 MED ORDER — OZEMPIC (0.25 OR 0.5 MG/DOSE) 2 MG/3ML ~~LOC~~ SOPN: 0.5000 mg | PEN_INJECTOR | SUBCUTANEOUS | 0 refills | Status: DC

## 2022-12-29 MED ORDER — BUPROPION HCL ER (SR) 200 MG PO TB12
200.0000 mg | ORAL_TABLET | Freq: Every morning | ORAL | 0 refills | Status: DC
Start: 2022-12-29 — End: 2023-03-02

## 2022-12-29 NOTE — Progress Notes (Signed)
Chief Complaint:   OBESITY Briana Ferguson is here to discuss her progress with her obesity treatment plan along with follow-up of her obesity related diagnoses. Briana Ferguson is on the Category 3 Plan and states she is following her eating plan approximately 0% of the time. Briana Ferguson states she is doing some walking.   Today's visit was #: 27 Starting weight: 242 lbs Starting date: 06/16/2020 Today's weight: 248 lbs Today's date: 12/29/2022 Total lbs lost to date: 0 Total lbs lost since last in-office visit: 0  Interim History: Patient has not been concentrating on her weight loss.  She has been skipping breakfast and she notes cravings have increased.  She is working on getting back on track.  Subjective:   1. Vitamin D deficiency Patient is on vitamin D, and she is due for labs.  No side effects were noted.  2. B12 deficiency Patient is on OTC B12 supplementation, and she is due for labs.  She is on a B12 rich diet.  3. Type 2 diabetes mellitus with other specified complication, without long-term current use of insulin (HCC) Patient is working on decreasing simple carbohydrates, but she has struggled more recently.  She notes minimal GI upset and decreased polyphagia.  4. Emotional Eating Behavior Patient notes some increase in emotional eating behavior.  No side effects were noted on Wellbutrin but she has missed some doses.  She is working on getting back on track.  Assessment/Plan:   1. Vitamin D deficiency We will check labs today, and we will refill prescription vitamin D 50,000 IU once weekly for 1 month.  - VITAMIN D 25 Hydroxy (Vit-D Deficiency, Fractures) - Cholecalciferol 1.25 MG (50000 UT) TABS; Take 1 tablet by mouth once a week.  Dispense: 4 tablet; Refill: 0  2. B12 deficiency We will check labs today, and we will follow-up at her next visit.  Patient will continue B12 1000 mcg daily.  - Vitamin B12  3. Type 2 diabetes mellitus with other specified complication, without  long-term current use of insulin (HCC) Plan we will check labs today, and we will refill Ozempic 0.5 mg once weekly for 1 month.  Patient will work on getting back to her eating plan.  - CMP14+EGFR - Lipid Panel With LDL/HDL Ratio - Insulin, random - Hemoglobin A1c - Microalbumin / creatinine urine ratio - Semaglutide,0.25 or 0.5MG /DOS, (OZEMPIC, 0.25 OR 0.5 MG/DOSE,) 2 MG/3ML SOPN; Inject 0.5 mg into the skin once a week.  Dispense: 3 mL; Refill: 0  4. Emotional Eating Behavior Patient will continue Wellbutrin SR 200 mg every morning, and we will refill for 1 month.  - buPROPion (WELLBUTRIN SR) 200 MG 12 hr tablet; Take 1 tablet (200 mg total) by mouth in the morning.  Dispense: 30 tablet; Refill: 0  5. BMI 37.0-37.9, adult  6. Obesity, Beginning BMI 39.68 Briana Ferguson is currently in the action stage of change. As such, her goal is to continue with weight loss efforts. She has agreed to the Category 3 Plan.   Behavioral modification strategies: no skipping meals.  Briana Ferguson has agreed to follow-up with our clinic in 4 weeks. She was informed of the importance of frequent follow-up visits to maximize her success with intensive lifestyle modifications for her multiple health conditions.   Briana Ferguson was informed we would discuss her lab results at her next visit unless there is a critical issue that needs to be addressed sooner. Briana Ferguson agreed to keep her next visit at the agreed upon time to discuss these  results.  Objective:   Blood pressure 110/70, pulse 68, temperature (!) 97.5 F (36.4 C), height 5\' 8"  (1.727 m), weight 248 lb (112.5 kg), last menstrual period 04/03/2011, SpO2 96%. Body mass index is 37.71 kg/m.  Lab Results  Component Value Date   CREATININE 0.94 07/20/2022   BUN 8 07/20/2022   NA 140 07/20/2022   K 4.3 07/20/2022   CL 103 07/20/2022   CO2 24 07/20/2022   Lab Results  Component Value Date   ALT 12 07/20/2022   AST 9 07/20/2022   ALKPHOS 89 07/20/2022   BILITOT 0.4  07/20/2022   Lab Results  Component Value Date   HGBA1C 6.0 (H) 07/20/2022   HGBA1C 5.9 (H) 01/18/2022   HGBA1C 6.3 (H) 09/08/2021   HGBA1C 6.1 (H) 01/26/2021   HGBA1C 6.0 (H) 06/16/2020   Lab Results  Component Value Date   INSULIN 16.0 07/20/2022   INSULIN 17.7 01/18/2022   INSULIN 22.3 09/08/2021   INSULIN 13.9 01/26/2021   INSULIN 14.1 06/16/2020   Lab Results  Component Value Date   TSH 2.190 06/16/2020   Lab Results  Component Value Date   CHOL 169 07/20/2022   HDL 68 07/20/2022   LDLCALC 83 07/20/2022   TRIG 100 07/20/2022   CHOLHDL 3 03/19/2019   Lab Results  Component Value Date   VD25OH 63.8 07/20/2022   VD25OH 49.1 01/18/2022   VD25OH 35.2 09/08/2021   Lab Results  Component Value Date   WBC 4.4 06/16/2020   HGB 13.1 06/16/2020   HCT 40.1 06/16/2020   MCV 87 06/16/2020   PLT 294 06/16/2020   No results found for: "IRON", "TIBC", "FERRITIN"  Attestation Statements:   Reviewed by clinician on day of visit: allergies, medications, problem list, medical history, surgical history, family history, social history, and previous encounter notes.  I have personally spent 40 minutes total time today in preparation, patient care, and documentation for this visit, including the following: review of clinical lab tests; review of medical tests/procedures/services.   I, Burt Knack, am acting as transcriptionist for Quillian Quince, MD.  I have reviewed the above documentation for accuracy and completeness, and I agree with the above. -  Quillian Quince, MD

## 2022-12-30 LAB — CMP14+EGFR
ALT: 21 IU/L (ref 0–32)
AST: 17 IU/L (ref 0–40)
Albumin: 4.3 g/dL (ref 3.8–4.9)
Alkaline Phosphatase: 87 IU/L (ref 44–121)
BUN/Creatinine Ratio: 13 (ref 9–23)
BUN: 13 mg/dL (ref 6–24)
Bilirubin Total: 0.4 mg/dL (ref 0.0–1.2)
CO2: 25 mmol/L (ref 20–29)
Calcium: 9.3 mg/dL (ref 8.7–10.2)
Chloride: 102 mmol/L (ref 96–106)
Creatinine, Ser: 0.97 mg/dL (ref 0.57–1.00)
Globulin, Total: 3 g/dL (ref 1.5–4.5)
Glucose: 105 mg/dL — ABNORMAL HIGH (ref 70–99)
Potassium: 4.3 mmol/L (ref 3.5–5.2)
Sodium: 141 mmol/L (ref 134–144)
Total Protein: 7.3 g/dL (ref 6.0–8.5)
eGFR: 67 mL/min/{1.73_m2} (ref >59)

## 2022-12-30 LAB — VITAMIN D 25 HYDROXY (VIT D DEFICIENCY, FRACTURES): Vit D, 25-Hydroxy: 55.2 ng/mL (ref 30.0–100.0)

## 2022-12-30 LAB — LIPID PANEL WITH LDL/HDL RATIO
Cholesterol, Total: 180 mg/dL (ref 100–199)
HDL: 72 mg/dL (ref >39)
LDL Chol Calc (NIH): 93 mg/dL (ref 0–99)
LDL/HDL Ratio: 1.3 ratio (ref 0.0–3.2)
Triglycerides: 80 mg/dL (ref 0–149)
VLDL Cholesterol Cal: 15 mg/dL (ref 5–40)

## 2022-12-30 LAB — VITAMIN B12: Vitamin B-12: 523 pg/mL (ref 232–1245)

## 2022-12-30 LAB — MICROALBUMIN / CREATININE URINE RATIO
Creatinine, Urine: 150.2 mg/dL
Microalb/Creat Ratio: <2 mg/g{creat} (ref 0–29)
Microalbumin, Urine: <3 ug/mL

## 2022-12-30 LAB — HEMOGLOBIN A1C
Est. average glucose Bld gHb Est-mCnc: 126 mg/dL
Hgb A1c MFr Bld: 6 % — ABNORMAL HIGH (ref 4.8–5.6)

## 2022-12-30 LAB — INSULIN, RANDOM: INSULIN: 16.9 u[IU]/mL (ref 2.6–24.9)

## 2023-01-30 ENCOUNTER — Ambulatory Visit (INDEPENDENT_AMBULATORY_CARE_PROVIDER_SITE_OTHER): Payer: BC Managed Care – PPO | Admitting: Family Medicine

## 2023-03-02 ENCOUNTER — Ambulatory Visit (INDEPENDENT_AMBULATORY_CARE_PROVIDER_SITE_OTHER): Payer: BC Managed Care – PPO | Admitting: Family Medicine

## 2023-03-02 ENCOUNTER — Encounter (INDEPENDENT_AMBULATORY_CARE_PROVIDER_SITE_OTHER): Payer: Self-pay | Admitting: Family Medicine

## 2023-03-02 VITALS — BP 129/74 | HR 62 | Temp 98.4°F | Ht 68.0 in | Wt 252.0 lb

## 2023-03-02 DIAGNOSIS — F3289 Other specified depressive episodes: Secondary | ICD-10-CM | POA: Diagnosis not present

## 2023-03-02 DIAGNOSIS — E1169 Type 2 diabetes mellitus with other specified complication: Secondary | ICD-10-CM

## 2023-03-02 DIAGNOSIS — Z6838 Body mass index (BMI) 38.0-38.9, adult: Secondary | ICD-10-CM

## 2023-03-02 DIAGNOSIS — E669 Obesity, unspecified: Secondary | ICD-10-CM | POA: Diagnosis not present

## 2023-03-02 DIAGNOSIS — Z7985 Long-term (current) use of injectable non-insulin antidiabetic drugs: Secondary | ICD-10-CM

## 2023-03-02 MED ORDER — OZEMPIC (0.25 OR 0.5 MG/DOSE) 2 MG/3ML ~~LOC~~ SOPN
0.5000 mg | PEN_INJECTOR | SUBCUTANEOUS | 0 refills | Status: DC
Start: 2023-03-02 — End: 2023-07-24

## 2023-03-02 MED ORDER — BUPROPION HCL ER (SR) 200 MG PO TB12
200.0000 mg | ORAL_TABLET | Freq: Every morning | ORAL | 0 refills | Status: DC
Start: 2023-03-02 — End: 2023-07-24

## 2023-03-02 NOTE — Progress Notes (Signed)
.smr  Office: [phone]  /  Fax: [phone]  WEIGHT SUMMARY AND BIOMETRICS  Anthropometric Measurements Height: 5\' 8"  (1.727 m) Weight: 252 lb (114.3 kg) BMI (Calculated): 38.33 Weight at Last Visit: 248 lb Weight Lost Since Last Visit: 0 Weight Gained Since Last Visit: 4 lb Total Weight Loss (lbs): 0 lb (0 kg)   Body Composition  Body Fat %: 44.8 % Fat Mass (lbs): 113.2 lbs Muscle Mass (lbs): 132.4 lbs Total Body Water (lbs): 99 lbs Visceral Fat Rating : 14   Other Clinical Data Fasting: No Labs: No Today's Visit #: 28    Chief Complaint: OBESITY   History of Present Illness   The patient, with a history of type two diabetes, obesity, and depression with emotional eating behaviors, presents for a routine follow-up. She reports a weight gain of four pounds over the last two months. She has been following her category three plan about forty percent of the time and exercising for about thirty minutes once a week.  The patient missed a dose of Ozempic due to a pharmacy issue, which she believes contributed to her recent weight gain. She expresses frustration with the rapidity of weight regain when off medication, despite maintaining similar dietary habits. She plans to increase her water intake and exercise regimen, hoping that with the aid of medication, she can manage her cravings and get back on track.  The patient also discusses her plans for the upcoming holiday season, expressing confidence in her ability to manage her diet during family gatherings and celebrations. She has a strategy to avoid overeating and taking home leftovers. She expresses interest in incorporating core strengthening exercises into her routine.  The patient does not have her next appointment scheduled but agrees to return before Christmas and also schedule a January appointment.          PHYSICAL EXAM:  Blood pressure 129/74, pulse 62, temperature 98.4 F (36.9 C), height 5\' 8"  (1.727 m),  weight 252 lb (114.3 kg), last menstrual period 04/03/2011, SpO2 98%. Body mass index is 38.32 kg/m.  DIAGNOSTIC DATA REVIEWED:  BMET    Component Value Date/Time   NA 141 12/29/2022 0754   K 4.3 12/29/2022 0754   CL 102 12/29/2022 0754   CO2 25 12/29/2022 0754   GLUCOSE 105 (H) 12/29/2022 0754   GLUCOSE 107 (H) 10/16/2019 1133   BUN 13 12/29/2022 0754   CREATININE 0.97 12/29/2022 0754   CALCIUM 9.3 12/29/2022 0754   GFRNONAA 54 (L) 06/16/2020 0908   GFRAA 62 06/16/2020 0908   Lab Results  Component Value Date   HGBA1C 6.0 (H) 12/29/2022   HGBA1C 6.5 03/19/2019   Lab Results  Component Value Date   INSULIN 16.9 12/29/2022   INSULIN 18.9 05/07/2019   Lab Results  Component Value Date   TSH 2.190 06/16/2020   CBC    Component Value Date/Time   WBC 4.4 06/16/2020 0908   WBC 5.4 10/16/2019 1133   RBC 4.62 06/16/2020 0908   RBC 4.87 10/16/2019 1133   HGB 13.1 06/16/2020 0908   HCT 40.1 06/16/2020 0908   PLT 294 06/16/2020 0908   MCV 87 06/16/2020 0908   MCH 28.4 06/16/2020 0908   MCH 27.7 10/16/2019 1133   MCHC 32.7 06/16/2020 0908   MCHC 31.7 10/16/2019 1133   RDW 13.1 06/16/2020 0908   Iron Studies No results found for: "IRON", "TIBC", "FERRITIN", "IRONPCTSAT" Lipid Panel     Component Value Date/Time   CHOL 180 12/29/2022 0754  TRIG 80 12/29/2022 0754   HDL 72 12/29/2022 0754   CHOLHDL 3 03/19/2019 1117   VLDL 19.4 03/19/2019 1117   LDLCALC 93 12/29/2022 0754   Hepatic Function Panel     Component Value Date/Time   PROT 7.3 12/29/2022 0754   ALBUMIN 4.3 12/29/2022 0754   AST 17 12/29/2022 0754   ALT 21 12/29/2022 0754   ALKPHOS 87 12/29/2022 0754   BILITOT 0.4 12/29/2022 0754   BILIDIR 0.1 03/19/2019 1117      Component Value Date/Time   TSH 2.190 06/16/2020 0908   Nutritional Lab Results  Component Value Date   VD25OH 55.2 12/29/2022   VD25OH 63.8 07/20/2022   VD25OH 49.1 01/18/2022     Assessment and Plan    Type 2 Diabetes  Mellitus Type 2 diabetes mellitus with recent 4-pound weight gain over two months. Inconsistent Ozempic use due to pharmacy issues likely contributed. Following category three eating plan 40% of the time and exercising 30 minutes once a week. Plans to improve medication adherence and lifestyle changes. Discussed importance of consistent medication use and adherence to dietary and exercise plans for blood glucose and weight management. - Ensure consistent use of Ozempic - Reinforce adherence to category three eating plan - Encourage regular exercise, aiming for more than once a week  Obesity Obesity with recent weight gain. Struggling with weight maintenance due to inconsistent medication use and high stress levels. Plans to increase water intake and exercise more regularly. Discussed benefits of regular exercise and increased water intake for weight management. - Increase water intake -follow category 3 plan closely - Continue and increase frequency of exercise - Consider core strengthening exercises as demonstrated Plans for managing diet during upcoming holidays and celebrations. Discussed strategies to avoid overeating and taking home leftovers during holiday meals. - Plan for healthy eating during holidays and celebrations - Avoid taking home leftovers from holiday meals  Depression with Emotional Eating Depression managed with Wellbutrin, contributing to emotional eating behaviors. High stress levels may exacerbate emotional eating. Discussed Wellbutrin's role in managing depression and importance of stress management in controlling emotional eating. - Continue Wellbutrin - Monitor and manage stress levels - Encourage adherence to healthy eating habits     Follow-up - Schedule follow-up appointment before Christmas - Schedule follow-up appointment in January.          She was informed of the importance of frequent follow up visits to maximize her success with intensive lifestyle  modifications for her multiple health conditions.    Quillian Quince, MD

## 2023-04-17 ENCOUNTER — Ambulatory Visit (INDEPENDENT_AMBULATORY_CARE_PROVIDER_SITE_OTHER): Payer: BC Managed Care – PPO | Admitting: Adult Health

## 2023-05-18 ENCOUNTER — Ambulatory Visit (INDEPENDENT_AMBULATORY_CARE_PROVIDER_SITE_OTHER): Payer: Self-pay | Admitting: Family Medicine

## 2023-05-25 ENCOUNTER — Other Ambulatory Visit: Payer: Self-pay | Admitting: Obstetrics and Gynecology

## 2023-05-25 DIAGNOSIS — Z1231 Encounter for screening mammogram for malignant neoplasm of breast: Secondary | ICD-10-CM

## 2023-06-20 ENCOUNTER — Ambulatory Visit (INDEPENDENT_AMBULATORY_CARE_PROVIDER_SITE_OTHER): Payer: Self-pay | Admitting: Family Medicine

## 2023-06-23 ENCOUNTER — Ambulatory Visit
Admission: RE | Admit: 2023-06-23 | Discharge: 2023-06-23 | Disposition: A | Discharge status: Home / Self Care | Payer: 59 | Source: Ambulatory Visit | Attending: Obstetrics and Gynecology | Admitting: Obstetrics and Gynecology

## 2023-06-23 DIAGNOSIS — Z1231 Encounter for screening mammogram for malignant neoplasm of breast: Secondary | ICD-10-CM

## 2023-07-13 ENCOUNTER — Telehealth: Payer: Self-pay | Admitting: *Deleted

## 2023-07-13 NOTE — Telephone Encounter (Signed)
 Copied from CRM [id number]. Topic: Appointments - Transfer of Care >> Jul 13, 2023  1:15 PM Denese Killings wrote: Pt is requesting to transfer FROM: Dr. Berniece Andreas Pt is requesting to transfer TO: Dr. Betty Swaziland Reason for requested transfer: Dr. Fabian Sharp is cutting her hours  It is the responsibility of the team the patient would like to transfer to (Dr. [place]) to reach out to the patient if for any reason this transfer is not acceptable.

## 2023-07-14 NOTE — Telephone Encounter (Signed)
 Ok with me  for transfer

## 2023-07-19 NOTE — Telephone Encounter (Signed)
 Pt is already scheduled in June.

## 2023-07-24 ENCOUNTER — Ambulatory Visit (INDEPENDENT_AMBULATORY_CARE_PROVIDER_SITE_OTHER): Payer: Self-pay | Admitting: Family Medicine

## 2023-07-24 ENCOUNTER — Encounter (INDEPENDENT_AMBULATORY_CARE_PROVIDER_SITE_OTHER): Payer: Self-pay | Admitting: Family Medicine

## 2023-07-24 VITALS — BP 112/73 | HR 69 | Temp 99.3°F | Ht 68.0 in | Wt 240.0 lb

## 2023-07-24 DIAGNOSIS — Z7985 Long-term (current) use of injectable non-insulin antidiabetic drugs: Secondary | ICD-10-CM

## 2023-07-24 DIAGNOSIS — E559 Vitamin D deficiency, unspecified: Secondary | ICD-10-CM | POA: Diagnosis not present

## 2023-07-24 DIAGNOSIS — E119 Type 2 diabetes mellitus without complications: Secondary | ICD-10-CM

## 2023-07-24 DIAGNOSIS — E1169 Type 2 diabetes mellitus with other specified complication: Secondary | ICD-10-CM

## 2023-07-24 DIAGNOSIS — M6289 Other specified disorders of muscle: Secondary | ICD-10-CM

## 2023-07-24 DIAGNOSIS — F5089 Other specified eating disorder: Secondary | ICD-10-CM | POA: Diagnosis not present

## 2023-07-24 DIAGNOSIS — F3289 Other specified depressive episodes: Secondary | ICD-10-CM

## 2023-07-24 DIAGNOSIS — Z6836 Body mass index (BMI) 36.0-36.9, adult: Secondary | ICD-10-CM

## 2023-07-24 DIAGNOSIS — E669 Obesity, unspecified: Secondary | ICD-10-CM

## 2023-07-24 MED ORDER — OZEMPIC (0.25 OR 0.5 MG/DOSE) 2 MG/1.5ML ~~LOC~~ SOPN
0.2500 mg | PEN_INJECTOR | SUBCUTANEOUS | 0 refills | Status: DC
Start: 2023-07-24 — End: 2023-08-23

## 2023-07-24 MED ORDER — BUPROPION HCL ER (SR) 200 MG PO TB12
200.0000 mg | ORAL_TABLET | Freq: Every morning | ORAL | 0 refills | Status: DC
Start: 2023-07-24 — End: 2024-02-19

## 2023-07-24 NOTE — Progress Notes (Signed)
 Office: [phone]  /  Fax: [phone]  WEIGHT SUMMARY AND BIOMETRICS  Anthropometric Measurements Height: 5\' 8"  (1.727 m) Weight: 240 lb (108.9 kg) BMI (Calculated): 36.5 Weight at Last Visit: 252 lb Weight Lost Since Last Visit: 12 lb Weight Gained Since Last Visit: 0 Starting Weight: 261 lb Total Weight Loss (lbs): 21 lb (9.526 kg)   Body Composition  Body Fat %: 38.2 % Fat Mass (lbs): 91.6 lbs Muscle Mass (lbs): 141 lbs Total Body Water (lbs): 98.4 lbs Visceral Fat Rating : 11   Other Clinical Data Fasting: No Labs: No Today's Visit #: 29 Starting Date: 05/07/19    Chief Complaint: OBESITY    History of Present Illness Briana Ferguson is a 63 year old female with obesity and type two diabetes who presents for obesity treatment and progress assessment.  She has been adhering to her category three eating plan approximately fifty percent of the time, resulting in a weight loss of twelve pounds over the past four months. Her dietary regimen includes intermittent fasting and a Daniels fast at the beginning of the year. She has increased her intake of green vegetables, fruits, and salmon while reducing carbohydrate consumption. Occasionally, she substitutes ice cream with zero-calorie Cool Whip.  She is currently on Ozempic 0.5 mg weekly for type two diabetes, although she ran out of the medication one to two months ago. Her last hemoglobin A1c was 6.0 six months ago. While on Ozempic, she experienced constipation, which she managed with increased water intake, belly massages, and Milk of Magnesia. She has Miralax available and consumes coffee daily.  She has a history of emotional eating behaviors and is taking Wellbutrin SR 200 mg in the morning, for which she requests a refill. She has been discussing menopause-related weight gain and pelvic floor issues with her OB GYN, who prescribed an estrogen cream. She is scheduled for pelvic floor physical therapy in  June.  She has a history of vitamin D deficiency and is currently taking over-the-counter vitamin D. Additionally, she takes B12 and magnesium supplements for energy and sleep, respectively, due to menopause.  She participates in community health activities through her church, focusing on reducing sugar intake and increasing physical activity. She plans to incorporate fifteen minutes of exercise into her daily routine as part of a community challenge.      PHYSICAL EXAM:  Blood pressure 112/73, pulse 69, temperature 99.3 F (37.4 C), height 5\' 8"  (1.727 m), weight 240 lb (108.9 kg), last menstrual period 04/03/2011, SpO2 98%. Body mass index is 36.49 kg/m.  DIAGNOSTIC DATA REVIEWED:  BMET    Component Value Date/Time   NA 141 12/29/2022 0754   K 4.3 12/29/2022 0754   CL 102 12/29/2022 0754   CO2 25 12/29/2022 0754   GLUCOSE 105 (H) 12/29/2022 0754   GLUCOSE 107 (H) 10/16/2019 1133   BUN 13 12/29/2022 0754   CREATININE 0.97 12/29/2022 0754   CALCIUM 9.3 12/29/2022 0754   GFRNONAA 54 (L) 06/16/2020 0908   GFRAA 62 06/16/2020 0908   Lab Results  Component Value Date   HGBA1C 6.0 (H) 12/29/2022   HGBA1C 6.5 03/19/2019   Lab Results  Component Value Date   INSULIN 16.9 12/29/2022   INSULIN 18.9 05/07/2019   Lab Results  Component Value Date   TSH 2.190 06/16/2020   CBC    Component Value Date/Time   WBC 4.4 06/16/2020 0908   WBC 5.4 10/16/2019 1133   RBC 4.62 06/16/2020 0908   RBC 4.87  10/16/2019 1133   HGB 13.1 06/16/2020 0908   HCT 40.1 06/16/2020 0908   PLT 294 06/16/2020 0908   MCV 87 06/16/2020 0908   MCH 28.4 06/16/2020 0908   MCH 27.7 10/16/2019 1133   MCHC 32.7 06/16/2020 0908   MCHC 31.7 10/16/2019 1133   RDW 13.1 06/16/2020 0908   Iron Studies No results found for: "IRON", "TIBC", "FERRITIN", "IRONPCTSAT" Lipid Panel     Component Value Date/Time   CHOL 180 12/29/2022 0754   TRIG 80 12/29/2022 0754   HDL 72 12/29/2022 0754   CHOLHDL 3  03/19/2019 1117   VLDL 19.4 03/19/2019 1117   LDLCALC 93 12/29/2022 0754   Hepatic Function Panel     Component Value Date/Time   PROT 7.3 12/29/2022 0754   ALBUMIN 4.3 12/29/2022 0754   AST 17 12/29/2022 0754   ALT 21 12/29/2022 0754   ALKPHOS 87 12/29/2022 0754   BILITOT 0.4 12/29/2022 0754   BILIDIR 0.1 03/19/2019 1117      Component Value Date/Time   TSH 2.190 06/16/2020 0908   Nutritional Lab Results  Component Value Date   VD25OH 55.2 12/29/2022   VD25OH 63.8 07/20/2022   VD25OH 49.1 01/18/2022     Assessment and Plan Assessment & Plan Type 2 Diabetes Mellitus She is on Ozempic 0.5 mg weekly for management. Her last hemoglobin A1c was 6.0 six months ago. She has been off Ozempic for one to two months due to running out of medication, with no significant issues except constipation, which she managed with hydration and belly massages. She will switch to Miralax for better management. - Restart Ozempic at a lower dose due to previous constipation - Use Miralax or Colalase daily to manage constipation - Send prescription to [facility]  Obesity She follows her category three eating plan about fifty percent of the time and has lost twelve pounds over the past four months. She engages in intermittent fasting, increased protein intake, and reduced carbohydrate consumption. She participates in community health activities and plans to add fifteen minutes of exercise to her daily routine as part of a church challenge. She reports no feelings of deprivation and has found alternatives to high-calorie foods. - Continue category three eating plan - Encourage regular exercise, aiming for at least 15 minutes daily - Continue intermittent fasting as tolerated  Emotional Eating She exhibits emotional eating behaviors and is on Wellbutrin SR 200 mg in the morning. She requests a refill. - Refill Wellbutrin SR 200 mg prescription  Pelvic Floor Dysfunction She reports a slight drop  in her pelvic floor with associated symptoms such as back pain and urinary tract infections. She is scheduled for pelvic floor physical therapy in June and is optimistic about the therapy based on others' positive experiences. - Proceed with scheduled pelvic floor physical therapy in June -Continue to work on weight loss to help reduce pressure on the pelvic floor  Vitamin D Deficiency She is taking over-the-counter vitamin D. Her last level was at goal at 55. She occasionally forgets to take it but compensates with sun exposure. - Continue over-the-counter vitamin D supplementation - Check vitamin D levels during next lab work     She was informed of the importance of frequent follow up visits to maximize her success with intensive lifestyle modifications for her multiple health conditions.    Quillian Quince, MD

## 2023-08-23 ENCOUNTER — Ambulatory Visit (INDEPENDENT_AMBULATORY_CARE_PROVIDER_SITE_OTHER): Admitting: Family Medicine

## 2023-08-23 ENCOUNTER — Encounter (INDEPENDENT_AMBULATORY_CARE_PROVIDER_SITE_OTHER): Payer: Self-pay | Admitting: Family Medicine

## 2023-08-23 VITALS — BP 115/71 | HR 67 | Temp 98.5°F | Ht 68.0 in | Wt 240.0 lb

## 2023-08-23 DIAGNOSIS — E538 Deficiency of other specified B group vitamins: Secondary | ICD-10-CM

## 2023-08-23 DIAGNOSIS — Z7985 Long-term (current) use of injectable non-insulin antidiabetic drugs: Secondary | ICD-10-CM

## 2023-08-23 DIAGNOSIS — E559 Vitamin D deficiency, unspecified: Secondary | ICD-10-CM | POA: Diagnosis not present

## 2023-08-23 DIAGNOSIS — E119 Type 2 diabetes mellitus without complications: Secondary | ICD-10-CM

## 2023-08-23 DIAGNOSIS — E1169 Type 2 diabetes mellitus with other specified complication: Secondary | ICD-10-CM

## 2023-08-23 DIAGNOSIS — Z6836 Body mass index (BMI) 36.0-36.9, adult: Secondary | ICD-10-CM

## 2023-08-23 DIAGNOSIS — E669 Obesity, unspecified: Secondary | ICD-10-CM

## 2023-08-23 MED ORDER — LANCETS MISC. MISC: 1.0000 | Freq: Three times a day (TID) | 0 refills | Status: AC

## 2023-08-23 MED ORDER — LANCET DEVICE MISC: 1.0000 | Freq: Three times a day (TID) | 0 refills | Status: AC

## 2023-08-23 MED ORDER — BLOOD GLUCOSE TEST VI STRP: 1.0000 | ORAL_STRIP | Freq: Three times a day (TID) | 0 refills | Status: AC

## 2023-08-23 MED ORDER — OZEMPIC (0.25 OR 0.5 MG/DOSE) 2 MG/1.5ML ~~LOC~~ SOPN: 0.2500 mg | PEN_INJECTOR | SUBCUTANEOUS | 0 refills | Status: DC

## 2023-08-23 MED ORDER — BLOOD GLUCOSE MONITORING SUPPL DEVI
1.0000 | Freq: Three times a day (TID) | 0 refills | Status: AC
Start: 2023-08-23 — End: ?

## 2023-08-23 NOTE — Progress Notes (Signed)
 Office: [phone]  /  Fax: [phone]  WEIGHT SUMMARY AND BIOMETRICS  Anthropometric Measurements Height: 5\' 8"  (1.727 m) Weight: 240 lb (108.9 kg) BMI (Calculated): 36.5 Weight at Last Visit: 240lb Weight Lost Since Last Visit: 0 Weight Gained Since Last Visit: 0 Starting Weight: 261lb Total Weight Loss (lbs): 21 lb (9.526 kg)   Body Composition  Body Fat %: 41.7 % Fat Mass (lbs): 100.2 lbs Muscle Mass (lbs): 133 lbs Total Body Water (lbs): 95 lbs Visceral Fat Rating : 12   Other Clinical Data Fasting: no Labs: no Today's Visit #: 30 Starting Date: 05/07/19    Chief Complaint: OBESITY     History of Present Illness Briana Ferguson is a 63 year old female with obesity who presents for assessment of her obesity treatment and progress.  She has been adhering to her prescribed category three eating plan about forty percent of the time and exercises ten to fifteen minutes three times a week. She has maintained her weight over the last month and has lost a total of twenty-eight pounds since starting with the practice. During special occasions, such as her daughter's birthday party and Easter, she made healthier food choices, like opting for grilled chicken with vegetables instead of pasta, to avoid weight gain. She finds it challenging to maintain her diet during celebrations but focuses on making reasonable choices and managing leftovers to prevent overeating.  She is currently on Ozempic , having restarted at a dose of 0.25 mg due to a previous lapse in prescription. She has taken two doses without any issues and did not gain weight during the period she was off the medication. She does not regularly check her blood sugar but has experienced 'jitters' in the past, which she associates with low blood sugar and manages by eating. She has family members with diabetes and is aware of the symptoms of high and low blood sugar.  She drinks water most of the time but  experiences feelings of dehydration, particularly after sweating at night, which she attributes to menopause. She is not on any medication for cholesterol and takes vitamin D  50,000 IU weekly when she remembers. No recent episodes of feeling jittery since her last visit.      PHYSICAL EXAM:  Blood pressure 115/71, pulse 67, temperature 98.5 F (36.9 C), height 5\' 8"  (1.727 m), weight 240 lb (108.9 kg), last menstrual period 04/03/2011, SpO2 98%. Body mass index is 36.49 kg/m.  DIAGNOSTIC DATA REVIEWED:  BMET    Component Value Date/Time   NA 141 12/29/2022 0754   K 4.3 12/29/2022 0754   CL 102 12/29/2022 0754   CO2 25 12/29/2022 0754   GLUCOSE 105 (H) 12/29/2022 0754   GLUCOSE 107 (H) 10/16/2019 1133   BUN 13 12/29/2022 0754   CREATININE 0.97 12/29/2022 0754   CALCIUM 9.3 12/29/2022 0754   GFRNONAA 54 (L) 06/16/2020 0908   GFRAA 62 06/16/2020 0908   Lab Results  Component Value Date   HGBA1C 6.0 (H) 12/29/2022   HGBA1C 6.5 03/19/2019   Lab Results  Component Value Date   INSULIN  16.9 12/29/2022   INSULIN  18.9 05/07/2019   Lab Results  Component Value Date   TSH 2.190 06/16/2020   CBC    Component Value Date/Time   WBC 4.4 06/16/2020 0908   WBC 5.4 10/16/2019 1133   RBC 4.62 06/16/2020 0908   RBC 4.87 10/16/2019 1133   HGB 13.1 06/16/2020 0908   HCT 40.1 06/16/2020 0908   PLT 294 06/16/2020  0908   MCV 87 06/16/2020 0908   MCH 28.4 06/16/2020 0908   MCH 27.7 10/16/2019 1133   MCHC 32.7 06/16/2020 0908   MCHC 31.7 10/16/2019 1133   RDW 13.1 06/16/2020 0908   Iron Studies No results found for: "IRON", "TIBC", "FERRITIN", "IRONPCTSAT" Lipid Panel     Component Value Date/Time   CHOL 180 12/29/2022 0754   TRIG 80 12/29/2022 0754   HDL 72 12/29/2022 0754   CHOLHDL 3 03/19/2019 1117   VLDL 19.4 03/19/2019 1117   LDLCALC 93 12/29/2022 0754   Hepatic Function Panel     Component Value Date/Time   PROT 7.3 12/29/2022 0754   ALBUMIN 4.3 12/29/2022 0754    AST 17 12/29/2022 0754   ALT 21 12/29/2022 0754   ALKPHOS 87 12/29/2022 0754   BILITOT 0.4 12/29/2022 0754   BILIDIR 0.1 03/19/2019 1117      Component Value Date/Time   TSH 2.190 06/16/2020 0908   Nutritional Lab Results  Component Value Date   VD25OH 55.2 12/29/2022   VD25OH 63.8 07/20/2022   VD25OH 49.1 01/18/2022     Assessment and Plan Assessment & Plan Type 2 diabetes mellitus Currently on Ozempic  without issues, considering increasing dose from 0.25 mg to 0.5 mg. Does not regularly monitor blood glucose but associates past jitters with hypoglycemia. Educated on hyperglycemia and hypoglycemia symptoms and the importance of monitoring blood glucose. Ozempic  aids in stabilizing glucose levels, but prolonged fasting can cause hypoglycemia. - Increase Ozempic  dose to 0.5 mg. - Prescribe glucometer, strips, and lancets. - Instruct to monitor blood glucose twice daily, especially with hyperglycemia or hypoglycemia symptoms. - Educate on hyperglycemia symptoms (polyuria, polydipsia) and hypoglycemia symptoms (jitters, feeling hot, weakness, shakiness). - Encourage carrying glucose tablets for hypoglycemia management.  Obesity Maintained weight over the last month, with a total loss of 28 pounds since starting with the practice. Adheres to category three eating plan 40% of the time and exercises 10-15 minutes three times weekly. Makes reasonable dietary choices during special occasions and understands portion control. Current regimen is effective, preventing a potential 20-pound gain without intervention. - Continue category three eating plan. - Encourage exercise 10-15 minutes three times weekly. - Encourage reasonable dietary choices during special occasions.  Vitamin D  deficiency On vitamin D  supplementation (50,000 IU weekly) with inconsistent adherence. Vitamin D  levels to be checked with routine blood work. - Check vitamin D  levels with routine blood work.  Vitamin B12  deficiency Vitamin B12 levels to be checked with routine blood work to monitor deficiency status. - Check vitamin B12 levels with routine blood work.    She was informed of the importance of frequent follow up visits to maximize her success with intensive lifestyle modifications for her multiple health conditions.    Jasmine Mesi, MD

## 2023-08-24 LAB — VITAMIN D 25 HYDROXY (VIT D DEFICIENCY, FRACTURES): Vit D, 25-Hydroxy: 58.5 ng/mL (ref 30.0–100.0)

## 2023-08-24 LAB — VITAMIN B12: Vitamin B-12: 542 pg/mL (ref 232–1245)

## 2023-08-24 LAB — CMP14+EGFR
ALT: 13 IU/L (ref 0–32)
AST: 14 IU/L (ref 0–40)
Albumin: 4.4 g/dL (ref 3.8–4.9)
Alkaline Phosphatase: 84 IU/L (ref 44–121)
BUN/Creatinine Ratio: 12 (ref 12–28)
BUN: 12 mg/dL (ref 8–27)
Bilirubin Total: 0.5 mg/dL (ref 0.0–1.2)
CO2: 24 mmol/L (ref 20–29)
Calcium: 9.2 mg/dL (ref 8.7–10.3)
Chloride: 101 mmol/L (ref 96–106)
Creatinine, Ser: 0.99 mg/dL (ref 0.57–1.00)
Globulin, Total: 2.8 g/dL (ref 1.5–4.5)
Glucose: 72 mg/dL (ref 70–99)
Potassium: 4 mmol/L (ref 3.5–5.2)
Sodium: 139 mmol/L (ref 134–144)
Total Protein: 7.2 g/dL (ref 6.0–8.5)
eGFR: 65 mL/min/1.73

## 2023-08-24 LAB — MICROALBUMIN / CREATININE URINE RATIO
Creatinine, Urine: 264.8 mg/dL
Microalb/Creat Ratio: 2 mg/g{creat} (ref 0–29)
Microalbumin, Urine: 6.6 ug/mL

## 2023-08-24 LAB — HEMOGLOBIN A1C
Est. average glucose Bld gHb Est-mCnc: 120 mg/dL
Hgb A1c MFr Bld: 5.8 % — ABNORMAL HIGH (ref 4.8–5.6)

## 2023-08-24 LAB — INSULIN, RANDOM: INSULIN: 7.3 u[IU]/mL (ref 2.6–24.9)

## 2023-08-27 ENCOUNTER — Other Ambulatory Visit (INDEPENDENT_AMBULATORY_CARE_PROVIDER_SITE_OTHER): Payer: Self-pay | Admitting: Family Medicine

## 2023-08-27 DIAGNOSIS — F3289 Other specified depressive episodes: Secondary | ICD-10-CM

## 2023-09-27 ENCOUNTER — Other Ambulatory Visit (INDEPENDENT_AMBULATORY_CARE_PROVIDER_SITE_OTHER): Payer: Self-pay | Admitting: Family Medicine

## 2023-09-27 DIAGNOSIS — E1169 Type 2 diabetes mellitus with other specified complication: Secondary | ICD-10-CM

## 2023-10-16 ENCOUNTER — Encounter: Admitting: Family Medicine

## 2023-10-23 ENCOUNTER — Ambulatory Visit (INDEPENDENT_AMBULATORY_CARE_PROVIDER_SITE_OTHER): Admitting: Family Medicine

## 2023-11-20 ENCOUNTER — Encounter (INDEPENDENT_AMBULATORY_CARE_PROVIDER_SITE_OTHER): Payer: Self-pay

## 2023-11-20 ENCOUNTER — Encounter (INDEPENDENT_AMBULATORY_CARE_PROVIDER_SITE_OTHER): Admitting: Family Medicine

## 2023-11-20 DIAGNOSIS — E669 Obesity, unspecified: Secondary | ICD-10-CM

## 2023-11-20 NOTE — Progress Notes (Signed)
 error

## 2024-02-02 ENCOUNTER — Ambulatory Visit: Admitting: Family Medicine

## 2024-02-02 VITALS — BP 100/64 | HR 64 | Temp 98.2°F | Resp 16 | Ht 67.52 in | Wt 241.2 lb

## 2024-02-02 DIAGNOSIS — Z23 Encounter for immunization: Secondary | ICD-10-CM

## 2024-02-02 DIAGNOSIS — G4733 Obstructive sleep apnea (adult) (pediatric): Secondary | ICD-10-CM | POA: Diagnosis not present

## 2024-02-02 DIAGNOSIS — Z7985 Long-term (current) use of injectable non-insulin antidiabetic drugs: Secondary | ICD-10-CM

## 2024-02-02 DIAGNOSIS — N1831 Chronic kidney disease, stage 3a: Secondary | ICD-10-CM

## 2024-02-02 DIAGNOSIS — E119 Type 2 diabetes mellitus without complications: Secondary | ICD-10-CM | POA: Diagnosis not present

## 2024-02-02 DIAGNOSIS — Z7689 Persons encountering health services in other specified circumstances: Secondary | ICD-10-CM

## 2024-02-02 NOTE — Patient Instructions (Addendum)
 A few things to remember from today's visit:  Encounter to establish care  Influenza vaccine needed - Plan: Flu vaccine trivalent PF, 6mos and older(Flulaval,Afluria,Fluarix,Fluzone)  OSA (obstructive sleep apnea) Continue following with pulmonology for your sleep apnea. Since you are following with weight loss clinic, where they do blood work regularly, I think I can see you annually, before if needed.  If you need refills for medications you take chronically, please call your pharmacy. Do not use My Chart to request refills or for acute issues that need immediate attention. If you send a my chart message, it may take a few days to be addressed, specially if I am not in the office.  Please be sure medication list is accurate. If a new problem present, please set up appointment sooner than planned today.

## 2024-02-02 NOTE — Progress Notes (Signed)
 Chief Complaint  Patient presents with   Establish Care    Has cpap, would like to discuss with provider, seeing pulmonology -Dr. Chantal. See [facility]- would like to discuss on Ozempic .    Discussed the use of AI scribe software for clinical note transcription with the patient, who gave verbal consent to proceed.  History of Present Illness Briana Ferguson is a 63 year old female who presents for establishing care. Former PCP: Dr Charlett.  She has a history of DM II, managed with Ozempic  [facility]. Her hemoglobin A1c was once 6.5 at the time of Dx, 02/2019, x 1. There is a family history of diabetes affecting both parents and grandparents.  Lab Results  Component Value Date   HGBA1C 5.8 (H) 08/23/2023   She has obstructive sleep apnea and uses a CPAP machine nightly. Over the past two years, she has lost about 27 pounds. Her last sleep study was two years ago. She experiences interrupted sleep, often waking around 2 AM and returning to sleep around 3 AM, attributing this to menopause.  She has chronic kidney disease stage 3A. She reports history of dehydration since infancy, which she thinks may contribute to her kidney issues. She has no history of hypertension and no chronic use of NSAID's. Fhx of amyloidosis, for which she has been screened and negative. Lab Results  Component Value Date   NA 139 08/23/2023   CL 101 08/23/2023   K 4.0 08/23/2023   CO2 24 08/23/2023   BUN 12 08/23/2023   CREATININE 0.99 08/23/2023   EGFR 65 08/23/2023   CALCIUM 9.2 08/23/2023   ALBUMIN 4.4 08/23/2023   GLUCOSE 72 08/23/2023   Lab Results  Component Value Date   LABMICR 6.6 08/23/2023   LABMICR <3.0 12/29/2022   She experiences back pain, attributed to prolonged sitting at her job. The pain is not severe, does not radiate to her legs, and is mostly on the right side but can occur on both sides. She also experiences occasional chest pain,  described as a pressure occurring at rest usually and when driving sometimes. No associated symptoms.  She has a B12 deficiency, constipation, and a history of insomnia and chronic fatigue.  She takes a daily multivitamin. She manages her diet to aid in weight loss and reduce sugar intake, which has helped with cravings. She has not taken bupropion  for months, initially prescribed to help with cravings.Denies hx of depression.  She is up to date with her preventive care. She plans to see her eye care provider soon, as it has been about a year and a half since her last visit. She was previously told she has a small cataract in her right eye.  Review of Systems  Constitutional:  Negative for activity change, appetite change and fever.  HENT:  Negative for sore throat and trouble swallowing.   Eyes:  Negative for redness and visual disturbance.  Respiratory:  Negative for cough, shortness of breath and wheezing.   Cardiovascular:  Negative for chest pain, palpitations and leg swelling.  Gastrointestinal:  Negative for abdominal pain, nausea and vomiting.  Endocrine: Negative for cold intolerance and heat intolerance.  Genitourinary:  Negative for decreased urine volume, dysuria and hematuria.  Skin:  Negative for rash.  Neurological:  Negative for syncope, weakness and headaches.  See other pertinent positives and negatives in HPI.  Current Outpatient Medications on File Prior to Visit  Medication Sig Dispense Refill  Blood Glucose Monitoring Suppl DEVI 1 each by Does not apply route in the morning, at noon, and at bedtime. May substitute to any manufacturer covered by patient's insurance. 1 each 0   buPROPion  (WELLBUTRIN  SR) 200 MG 12 hr tablet Take 1 tablet (200 mg total) by mouth in the morning. 30 tablet 0   Cholecalciferol  1.25 MG (50000 UT) TABS Take 1 tablet by mouth once a week. 4 tablet 0   cyanocobalamin (VITAMIN B12) 1000 MCG tablet Take 1 tablet (1,000 mcg total) by mouth daily.  30 tablet 0   fluticasone  (FLONASE ) 50 MCG/ACT nasal spray 2 sprays each nostril as needed 16 g 0   MAGNESIUM PO Take 2,600 mg by mouth as needed.     Multiple Vitamins-Minerals (CENTRUM SILVER 50+WOMEN PO) Take 1 tablet by mouth daily.     Semaglutide ,0.25 or 0.5MG /DOS, (OZEMPIC , 0.25 OR 0.5 MG/DOSE,) 2 MG/1.5ML SOPN Inject 0.25 mg into the skin once a week. 2 mL 0   No current facility-administered medications on file prior to visit.   Past Medical History:  Diagnosis Date   Anemia    as a child   Anxiety    no per pt   Arthritis    Attention and concentration deficit 2011   had med trial Dr Stacia ? dx    Back pain    Bilateral carpal tunnel syndrome    Diabetes mellitus without complication (HCC)    Dyspnea    Environmental allergies    Cats   GERD (gastroesophageal reflux disease)    IBS (irritable bowel syndrome)    Joint pain    Lactose intolerance    Lower extremity edema    Multiple food allergies    Other fatigue    Prediabetes    Shortness of breath on exertion    Sleep apnea    wears CPAP   Swallowing difficulty    Tubular adenoma of colon    Vitamin D  deficiency    Allergies  Allergen Reactions   Sulfamethoxazole-Trimethoprim     REACTION: feels faint/per pt not sure if allergic to med. Was at young age. Pt states currently she is not allgeric to anything.     Family History  Problem Relation Age of Onset   Diabetes Father    Stroke Father        deceased   Heart disease Father    Sleep apnea Father    Hypertension Father    Sudden death Father    Hyperlipidemia Father    Diabetes Mother    Stroke Mother    Sleep apnea Mother    Hypertension Mother    Heart disease Mother    Thyroid  disease Mother    Obesity Mother    Hyperlipidemia Mother    Kidney disease Mother    Hyperlipidemia Other        fhx   Cancer Other        granfather/liver   Diabetes Maternal Grandmother    Heart disease Maternal Grandmother    Liver cancer Maternal  Grandfather    Diabetes Paternal Grandmother    Heart disease Paternal Grandmother    Colon cancer Neg Hx    Stomach cancer Neg Hx    Pancreatic cancer Neg Hx    Esophageal cancer Neg Hx    Rectal cancer Neg Hx     Social History   Socioeconomic History   Marital status: Married    Spouse name: Brinn Westby   Number of children: Not on  file   Years of education: Not on file   Highest education level: Bachelor's degree (e.g., BA, AB, BS)  Occupational History   Occupation: Electrical engineer: [employer]  Tobacco Use   Smoking status: Never   Smokeless tobacco: Never  Vaping Use   Vaping status: Never Used  Substance and Sexual Activity   Alcohol use: No    Comment: none   Drug use: No   Sexual activity: Yes    Partners: Male  Other Topics Concern   Not on file  Social History Narrative   [employer] degree  May 2011.  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   4  Daughter husband and m in Risk analyst  Outside pet.      Has smoke detector and wears seat belts.  No firearms. Stored safely .Sees dentist regularly . No depression       CB x 3    Husband with vascectomy            Social Drivers of Health   Financial Resource Strain: Low Risk  (02/02/2024)   Overall Financial Resource Strain (CARDIA)    Difficulty of Paying Living Expenses: Not very hard  Food Insecurity: No Food Insecurity (02/02/2024)   Hunger Vital Sign    Worried About Running Out of Food in the Last Year: Never true    Ran Out of Food in the Last Year: Never true  Transportation Needs: No Transportation Needs (02/02/2024)   PRAPARE - Administrator, Civil Service (Medical): No    Lack of Transportation (Non-Medical): No  Physical Activity: Inactive (02/02/2024)   Exercise Vital Sign    Days of Exercise per Week: 0 days    Minutes of Exercise per Session: Not on file  Stress: No  Stress Concern Present (02/02/2024)   [employer] of Occupational Health - Occupational Stress Questionnaire    Feeling of Stress: Only a little  Social Connections: Socially Integrated (02/02/2024)   Social Connection and Isolation Panel    Frequency of Communication with Friends and Family: More than three times a week    Frequency of Social Gatherings with Friends and Family: More than three times a week    Attends Religious Services: More than 4 times per year    Active Member of [employer] or Organizations: Yes    Attends Engineer, structural: More than 4 times per year    Marital Status: Married    Vitals:   02/02/24 1620  BP: 100/64  Pulse: 64  Resp: 16  Temp: 98.2 F (36.8 C)  SpO2: 97%   Body mass index is 37.2 kg/m.  Physical Exam Vitals and nursing note reviewed.  Constitutional:      General: She is not in acute distress.    Appearance: She is well-developed.  HENT:     Head: Normocephalic and atraumatic.     Mouth/Throat:     Mouth: Mucous membranes are moist.     Pharynx: Oropharynx is clear.  Eyes:     Conjunctiva/sclera: Conjunctivae normal.  Cardiovascular:     Rate and Rhythm: Normal rate and regular rhythm.     Pulses:          Dorsalis pedis pulses are 2+ on the right side and 2+ on the left side.  Heart sounds: No murmur heard. Pulmonary:     Effort: Pulmonary effort is normal. No respiratory distress.     Breath sounds: Normal breath sounds.  Abdominal:     Palpations: Abdomen is soft. There is no hepatomegaly or mass.     Tenderness: There is no abdominal tenderness.  Lymphadenopathy:     Cervical: No cervical adenopathy.  Skin:    General: Skin is warm.     Findings: No erythema or rash.  Neurological:     General: No focal deficit present.     Mental Status: She is alert and oriented to person, place, and time.     Cranial Nerves: No cranial nerve deficit.     Gait: Gait normal.  Psychiatric:        Mood and Affect: Mood  and affect normal.    ASSESSMENT AND PLAN:  Ms. Kamron Portee was seen today for establish care.  Diagnoses and all orders for this visit:  OSA (obstructive sleep apnea) Assessment & Plan: She has lost about 20+ Lb and having issues with sleep again. She thinks CPAP setting need to be adjusted. She is plannin gon arranging appt with pulmonologist.   Stage 3a chronic kidney disease (HCC) Assessment & Plan: Last e GFR 65 and Cr 0.9. No hx of HTN. DM II adequately controlled. Continue adequate hydration,low salt diet,and avoidance of NSAID's. Currently she is not on RAS agent or SGLT2 INH.   Type 2 diabetes mellitus without complication, without long-term current use of insulin  [facility]) Assessment & Plan: Problem has been well controlled. She is on Ozempic , [facility]. Continue appropriate eye,dental,and foot care.   Need for pneumococcal vaccination -     Pneumococcal conjugate vaccine 20-valent  Influenza vaccine needed -     Flu vaccine trivalent PF, 6mos and older(Flulaval,Afluria,Fluarix,Fluzone)  I personally spent a total of 32 minutes in the care of the patient today including preparing to see the patient, getting/reviewing separately obtained history, performing a medically appropriate exam/evaluation, counseling and educating, documenting clinical information in the EHR, and independently interpreting results.  Return in about 1 year (around 02/01/2025) for CPE.  Dacari Beckstrand G. [place], MD  [facility]. [place] office.

## 2024-02-03 NOTE — Assessment & Plan Note (Signed)
 Problem has been well controlled. She is on Ozempic , [facility]. Continue appropriate eye,dental,and foot care.

## 2024-02-03 NOTE — Assessment & Plan Note (Signed)
 She has lost about 20+ Lb and having issues with sleep again. She thinks CPAP setting need to be adjusted. She is plannin gon arranging appt with pulmonologist.

## 2024-02-03 NOTE — Assessment & Plan Note (Addendum)
 Last e GFR 65 and Cr 0.9. No hx of HTN. DM II adequately controlled. Continue adequate hydration,low salt diet,and avoidance of NSAID's. Currently she is not on RAS agent or SGLT2 INH.

## 2024-02-14 ENCOUNTER — Ambulatory Visit (INDEPENDENT_AMBULATORY_CARE_PROVIDER_SITE_OTHER): Admitting: Family Medicine

## 2024-02-19 ENCOUNTER — Other Ambulatory Visit: Payer: Self-pay

## 2024-02-19 ENCOUNTER — Encounter (INDEPENDENT_AMBULATORY_CARE_PROVIDER_SITE_OTHER): Payer: Self-pay | Admitting: Family Medicine

## 2024-02-19 ENCOUNTER — Other Ambulatory Visit (HOSPITAL_COMMUNITY): Payer: Self-pay

## 2024-02-19 ENCOUNTER — Telehealth (INDEPENDENT_AMBULATORY_CARE_PROVIDER_SITE_OTHER): Payer: Self-pay | Admitting: Family Medicine

## 2024-02-19 ENCOUNTER — Ambulatory Visit (INDEPENDENT_AMBULATORY_CARE_PROVIDER_SITE_OTHER): Admitting: Family Medicine

## 2024-02-19 VITALS — BP 123/82 | HR 72 | Temp 97.8°F | Ht 68.0 in | Wt 238.0 lb

## 2024-02-19 DIAGNOSIS — G4733 Obstructive sleep apnea (adult) (pediatric): Secondary | ICD-10-CM

## 2024-02-19 DIAGNOSIS — Z6836 Body mass index (BMI) 36.0-36.9, adult: Secondary | ICD-10-CM

## 2024-02-19 DIAGNOSIS — E119 Type 2 diabetes mellitus without complications: Secondary | ICD-10-CM

## 2024-02-19 DIAGNOSIS — F3289 Other specified depressive episodes: Secondary | ICD-10-CM

## 2024-02-19 DIAGNOSIS — N951 Menopausal and female climacteric states: Secondary | ICD-10-CM

## 2024-02-19 DIAGNOSIS — Z7985 Long-term (current) use of injectable non-insulin antidiabetic drugs: Secondary | ICD-10-CM

## 2024-02-19 DIAGNOSIS — E1169 Type 2 diabetes mellitus with other specified complication: Secondary | ICD-10-CM

## 2024-02-19 DIAGNOSIS — E538 Deficiency of other specified B group vitamins: Secondary | ICD-10-CM

## 2024-02-19 DIAGNOSIS — E559 Vitamin D deficiency, unspecified: Secondary | ICD-10-CM

## 2024-02-19 MED ORDER — CHOLECALCIFEROL 1.25 MG (50000 UT) PO CAPS
50000.0000 [IU] | ORAL_CAPSULE | ORAL | 0 refills | Status: DC
  Filled 2024-02-19: qty 4, 28d supply, fill #0

## 2024-02-19 MED ORDER — BUPROPION HCL ER (SR) 200 MG PO TB12
200.0000 mg | ORAL_TABLET | Freq: Every morning | ORAL | 0 refills | Status: DC
  Filled 2024-02-19: qty 30, 30d supply, fill #0

## 2024-02-19 MED ORDER — VITAMIN B-12 1000 MCG PO TABS
1000.0000 ug | ORAL_TABLET | Freq: Every day | ORAL | 0 refills | Status: DC
  Filled 2024-02-19: qty 30, 30d supply, fill #0

## 2024-02-19 MED ORDER — TIRZEPATIDE-WEIGHT MANAGEMENT 2.5 MG/0.5ML ~~LOC~~ SOAJ
2.5000 mg | SUBCUTANEOUS | 0 refills | Status: DC
  Filled 2024-02-19 – 2024-02-21 (×2): qty 2, 28d supply, fill #0

## 2024-02-19 NOTE — Telephone Encounter (Signed)
 Pt called stating that her Zepbound RX is not being paid for by her insurance. Pt wants Dr. Verdon and Baker to resubmit the request for the Zepbound. Pt stated that she discussed this with Dr. Verdon previously.

## 2024-02-19 NOTE — Progress Notes (Signed)
 Office: [phone]  /  Fax: [phone]  WEIGHT SUMMARY AND BIOMETRICS  Anthropometric Measurements Height: 5' 8 (1.727 m) Weight: 238 lb (108 kg) BMI (Calculated): 36.2 Weight at Last Visit: 240lb Weight Lost Since Last Visit: 2lb Weight Gained Since Last Visit: 0 Starting Weight: 261lb Total Weight Loss (lbs): 23 lb (10.4 kg)   Body Composition  Body Fat %: 38.1 % Fat Mass (lbs): 90.8 lbs Muscle Mass (lbs): 140 lbs Total Body Water (lbs): 96.4 lbs Visceral Fat Rating : 11   Other Clinical Data Fasting: no Labs: o Today's Visit #: 31 Starting Date: 05/07/19    Chief Complaint: OBESITY   Discussed the use of AI scribe software for clinical note transcription with the patient, who gave verbal consent to proceed.  History of Present Illness Briana Ferguson is a 63 year old female with obesity and type 2 diabetes who presents for obesity treatment.  She has been adhering to the category three eating plan about fifty percent of the time and is working on increasing her hydration. She often skips meals and does not consistently achieve seven to nine hours of sleep per night. She is attempting to incorporate more fruits and vegetables into her diet and meet her recommended protein intake. Despite high stress levels due to various life circumstances, she has managed to lose two pounds since her last visit approximately six months ago.  She has type 2 diabetes and is currently on Ozempic , which she requests a refill for. She had previously discontinued Ozempic  but has since resumed it.  She is postmenopausal and experiencing symptoms such as brain fog, insomnia, hot flashes, and irritability. She has been prescribed estradiol to manage her hormonal symptoms and is undergoing pelvic floor therapy, which she finds beneficial.  She has a history of sleep apnea diagnosed pre-COVID, with a sleep study in 2020 showing an apnea hypopnea index of 28.8. She currently uses a CPAP  machine but feels she may not need as much air as before due to weight management efforts.      PHYSICAL EXAM:  Blood pressure 123/82, pulse 72, temperature 97.8 F (36.6 C), height 5' 8 (1.727 m), weight 238 lb (108 kg), last menstrual period 04/03/2011, SpO2 98%. Body mass index is 36.19 kg/m.  DIAGNOSTIC DATA REVIEWED:  BMET    Component Value Date/Time   NA 139 08/23/2023 1526   K 4.0 08/23/2023 1526   CL 101 08/23/2023 1526   CO2 24 08/23/2023 1526   GLUCOSE 72 08/23/2023 1526   GLUCOSE 107 (H) 10/16/2019 1133   BUN 12 08/23/2023 1526   CREATININE 0.99 08/23/2023 1526   CALCIUM 9.2 08/23/2023 1526   GFRNONAA 54 (L) 06/16/2020 0908   GFRAA 62 06/16/2020 0908   Lab Results  Component Value Date   HGBA1C 5.8 (H) 08/23/2023   HGBA1C 6.5 03/19/2019   Lab Results  Component Value Date   INSULIN  7.3 08/23/2023   INSULIN  18.9 05/07/2019   Lab Results  Component Value Date   TSH 2.190 06/16/2020   CBC    Component Value Date/Time   WBC 4.4 06/16/2020 0908   WBC 5.4 10/16/2019 1133   RBC 4.62 06/16/2020 0908   RBC 4.87 10/16/2019 1133   HGB 13.1 06/16/2020 0908   HCT 40.1 06/16/2020 0908   PLT 294 06/16/2020 0908   MCV 87 06/16/2020 0908   MCH 28.4 06/16/2020 0908   MCH 27.7 10/16/2019 1133   MCHC 32.7 06/16/2020 0908   MCHC 31.7 10/16/2019 1133  RDW 13.1 06/16/2020 0908   Iron Studies No results found for: IRON, TIBC, FERRITIN, IRONPCTSAT Lipid Panel     Component Value Date/Time   CHOL 180 12/29/2022 0754   TRIG 80 12/29/2022 0754   HDL 72 12/29/2022 0754   CHOLHDL 3 03/19/2019 1117   VLDL 19.4 03/19/2019 1117   LDLCALC 93 12/29/2022 0754   Hepatic Function Panel     Component Value Date/Time   PROT 7.2 08/23/2023 1526   ALBUMIN 4.4 08/23/2023 1526   AST 14 08/23/2023 1526   ALT 13 08/23/2023 1526   ALKPHOS 84 08/23/2023 1526   BILITOT 0.5 08/23/2023 1526   BILIDIR 0.1 03/19/2019 1117      Component Value Date/Time   TSH 2.190  06/16/2020 0908   Nutritional Lab Results  Component Value Date   VD25OH 58.5 08/23/2023   VD25OH 55.2 12/29/2022   VD25OH 63.8 07/20/2022     Assessment and Plan Assessment & Plan Morbid obesity Morbid obesity with partial adherence to the prescribed category three eating plan. Weight loss of two pounds over six months. She has been off Ozempic  due to life stressors and is considering switching to Zepbound for better weight management and potential improvement in sleep apnea. Zepbound is a GLP-1 and GIP combination, potentially more effective for weight loss and reducing cravings compared to Ozempic . - Continue Ozempic  until Zepbound is approved. - Initiate Zepbound at 0.25 mg once approved. - Send Zepbound prescription to [facility] for mail delivery. - Plan to reassess the effectiveness of Zepbound after four doses.  Type 2 diabetes mellitus Type 2 diabetes mellitus currently managed with Ozempic . She is considering switching to Zepbound, which may also aid in diabetes management due to its GLP-1 component. - Continue current diabetes management with Ozempic  until Zepbound is approved. - Switch to Zepbound once approved, as it may provide additional benefits for diabetes management. - Continue diet, exercise and weight loss as discussed today as an important part of the treatment plan   Obstructive sleep apnea Obstructive sleep apnea diagnosed pre-COVID with an AHI of 28.8, indicating moderate severity. She reports feeling that CPAP settings may need adjustment due to changes in weight and dietary habits. Considering Zepbound for potential improvement in sleep apnea symptoms. - Plan to monitor sleep apnea symptoms after initiating Zepbound. - Continue diet, exercise and weight loss as discussed today as an important part of the treatment plan   Menopausal symptoms Menopausal symptoms including insomnia, hot flashes, and mood swings. Currently managed with estradiol.  Reports improvement with pelvic floor therapy. Discussion about the potential need for progesterone to mitigate the risk of uterine cancer. - Continue estradiol for menopausal symptoms. - Discuss with gynecologist the potential need for adding progesterone to the regimen.     Briana Ferguson was counseled on the importance of maintaining healthy lifestyle habits, including balanced nutrition, regular physical activity, and behavioral modifications, while taking antiobesity medication.  Patient verbalized understanding that medication is an adjunct to, not a replacement for, lifestyle changes and that the long-term success and weight maintenance depend on continued adherence to these strategies.   Chauntae was informed of the importance of frequent follow up visits to maximize her success with intensive lifestyle modifications for her obesity and obesity related health conditions as recommended by USPSTF and CMS guidelines   Louann Penton, MD

## 2024-02-20 ENCOUNTER — Telehealth (INDEPENDENT_AMBULATORY_CARE_PROVIDER_SITE_OTHER): Payer: Self-pay

## 2024-02-20 NOTE — Telephone Encounter (Addendum)
 Prior auth started for Zepbound for OSA.  Awaiting determination.

## 2024-02-21 ENCOUNTER — Other Ambulatory Visit (HOSPITAL_COMMUNITY): Payer: Self-pay

## 2024-02-21 NOTE — Telephone Encounter (Signed)
 Fax from [facility].  Prior authorization for Zepbound is not required.  Patient has been notified via my chart.

## 2024-02-21 NOTE — Telephone Encounter (Addendum)
 See message dated 02/20/24 for PA for Zepbound.

## 2024-02-24 ENCOUNTER — Other Ambulatory Visit (HOSPITAL_COMMUNITY): Payer: Self-pay

## 2024-03-12 MED ORDER — TIRZEPATIDE-WEIGHT MANAGEMENT 2.5 MG/0.5ML ~~LOC~~ SOLN: 2.5000 mg | SUBCUTANEOUS | 0 refills | Status: DC

## 2024-03-12 NOTE — Addendum Note (Signed)
 Addended by: LAFE BAKER CROME on: 03/12/2024 08:21 AM   Modules accepted: Orders

## 2024-03-12 NOTE — Telephone Encounter (Addendum)
 Call to patient.  Explained the [employer] self pay program to her. Explained that they would likely be calling her for payment.  Patient understood and verbalized understanding.  No further needs.  Call ended.

## 2024-03-12 NOTE — Addendum Note (Signed)
 Addended by: LAFE BAKER CROME on: 03/12/2024 09:44 AM   Modules accepted: Orders

## 2024-03-25 NOTE — Telephone Encounter (Signed)
 Patient calling asking about the voucher.  Will call the rep to see if she is coming today.

## 2024-03-27 ENCOUNTER — Ambulatory Visit (INDEPENDENT_AMBULATORY_CARE_PROVIDER_SITE_OTHER): Payer: Self-pay | Admitting: Family Medicine

## 2024-03-27 ENCOUNTER — Other Ambulatory Visit (HOSPITAL_COMMUNITY): Payer: Self-pay

## 2024-03-27 ENCOUNTER — Encounter (INDEPENDENT_AMBULATORY_CARE_PROVIDER_SITE_OTHER): Payer: Self-pay | Admitting: Family Medicine

## 2024-03-27 VITALS — BP 128/75 | HR 70 | Temp 98.4°F | Ht 68.0 in | Wt 238.0 lb

## 2024-03-27 DIAGNOSIS — E669 Obesity, unspecified: Secondary | ICD-10-CM

## 2024-03-27 DIAGNOSIS — Z7985 Long-term (current) use of injectable non-insulin antidiabetic drugs: Secondary | ICD-10-CM

## 2024-03-27 DIAGNOSIS — E538 Deficiency of other specified B group vitamins: Secondary | ICD-10-CM | POA: Diagnosis not present

## 2024-03-27 DIAGNOSIS — E119 Type 2 diabetes mellitus without complications: Secondary | ICD-10-CM | POA: Diagnosis not present

## 2024-03-27 DIAGNOSIS — G4733 Obstructive sleep apnea (adult) (pediatric): Secondary | ICD-10-CM | POA: Diagnosis not present

## 2024-03-27 DIAGNOSIS — E559 Vitamin D deficiency, unspecified: Secondary | ICD-10-CM

## 2024-03-27 DIAGNOSIS — F3289 Other specified depressive episodes: Secondary | ICD-10-CM

## 2024-03-27 DIAGNOSIS — Z6836 Body mass index (BMI) 36.0-36.9, adult: Secondary | ICD-10-CM

## 2024-03-27 MED ORDER — VITAMIN B-12 1000 MCG PO TABS
1000.0000 ug | ORAL_TABLET | Freq: Every day | ORAL | 0 refills | Status: DC
  Filled 2024-03-27: qty 30, 30d supply, fill #0

## 2024-03-27 MED ORDER — CHOLECALCIFEROL 1.25 MG (50000 UT) PO CAPS
50000.0000 [IU] | ORAL_CAPSULE | ORAL | 0 refills | Status: DC
  Filled 2024-03-27: qty 4, 28d supply, fill #0

## 2024-03-27 NOTE — Addendum Note (Signed)
 Addended by: LAFE BAKER CROME on: 03/27/2024 03:47 PM   Modules accepted: Level of Service

## 2024-03-27 NOTE — Progress Notes (Signed)
 Office: [phone]  /  Fax: [phone]  WEIGHT SUMMARY AND BIOMETRICS  Anthropometric Measurements Height: 5' 8 (1.727 m) Weight: 238 lb (108 kg) BMI (Calculated): 36.2 Weight at Last Visit: 238 lb Weight Lost Since Last Visit: 0 Weight Gained Since Last Visit: 0 Starting Weight: 261 lb Total Weight Loss (lbs): 23 lb (10.4 kg) Peak Weight: 261 lb   Body Composition  Body Fat %: 42 % Fat Mass (lbs): 100 lbs Muscle Mass (lbs): 131 lbs Total Body Water (lbs): 91.8 lbs Visceral Fat Rating : 13   Other Clinical Data Fasting: no Labs: no Today's Visit #: 32 Starting Date: 05/07/19    Chief Complaint: OBESITY    History of Present Illness Briana Ferguson is a 63 year old female with obesity, vitamin D  deficiency, B12 deficiency, obstructive sleep apnea, and type 2 diabetes who presents for obesity treatment and progress assessment.  She is following the category three eating plan about fifty percent of the time, focusing on increasing her intake of fruits, vegetables, and protein. She occasionally skips meals and does not always hydrate adequately. Despite these challenges, she has maintained her weight over the last five weeks. She gets seven to nine hours of sleep per night and has been incorporating more physical activity into her routine, such as floor stretches, walking, and parking further away.  She reports taking prescription cholecalciferol , 50,000 international units per week, for vitamin D  deficiency and requests a refill. She reports taking 1,000 mcg of B12 daily and requests a refill.  She has been prescribed Zepbound  and is in the process of transitioning to it, but has not yet started due to a delay in obtaining the medication. She previously took her last Ozempic  shot in early November and is in the process of transitioning to Zepbound , which she has not yet started due to a delay in obtaining the medication.  She has type 2 diabetes and is working on  diet, exercise, and weight loss to manage this condition. She has been mindful of her portion sizes and has been practicing intermittent fasting, typically eating her last meal around 6 or 7 PM and not eating again until 10 or 11 AM the next day. She uses strategies such as drinking water and eating walnuts to manage urges to eat outside of her planned meals.      PHYSICAL EXAM:  Blood pressure 128/75, pulse 70, temperature 98.4 F (36.9 C), height 5' 8 (1.727 m), weight 238 lb (108 kg), last menstrual period 04/03/2011, SpO2 100%. Body mass index is 36.19 kg/m.  DIAGNOSTIC DATA REVIEWED:  BMET    Component Value Date/Time   NA 139 08/23/2023 1526   K 4.0 08/23/2023 1526   CL 101 08/23/2023 1526   CO2 24 08/23/2023 1526   GLUCOSE 72 08/23/2023 1526   GLUCOSE 107 (H) 10/16/2019 1133   BUN 12 08/23/2023 1526   CREATININE 0.99 08/23/2023 1526   CALCIUM 9.2 08/23/2023 1526   GFRNONAA 54 (L) 06/16/2020 0908   GFRAA 62 06/16/2020 0908   Lab Results  Component Value Date   HGBA1C 5.8 (H) 08/23/2023   HGBA1C 6.5 03/19/2019   Lab Results  Component Value Date   INSULIN  7.3 08/23/2023   INSULIN  18.9 05/07/2019   Lab Results  Component Value Date   TSH 2.190 06/16/2020   CBC    Component Value Date/Time   WBC 4.4 06/16/2020 0908   WBC 5.4 10/16/2019 1133   RBC 4.62 06/16/2020 0908   RBC 4.87  10/16/2019 1133   HGB 13.1 06/16/2020 0908   HCT 40.1 06/16/2020 0908   PLT 294 06/16/2020 0908   MCV 87 06/16/2020 0908   MCH 28.4 06/16/2020 0908   MCH 27.7 10/16/2019 1133   MCHC 32.7 06/16/2020 0908   MCHC 31.7 10/16/2019 1133   RDW 13.1 06/16/2020 0908   Iron Studies No results found for: IRON, TIBC, FERRITIN, IRONPCTSAT Lipid Panel     Component Value Date/Time   CHOL 180 12/29/2022 0754   TRIG 80 12/29/2022 0754   HDL 72 12/29/2022 0754   CHOLHDL 3 03/19/2019 1117   VLDL 19.4 03/19/2019 1117   LDLCALC 93 12/29/2022 0754   Hepatic Function Panel      Component Value Date/Time   PROT 7.2 08/23/2023 1526   ALBUMIN 4.4 08/23/2023 1526   AST 14 08/23/2023 1526   ALT 13 08/23/2023 1526   ALKPHOS 84 08/23/2023 1526   BILITOT 0.5 08/23/2023 1526   BILIDIR 0.1 03/19/2019 1117      Component Value Date/Time   TSH 2.190 06/16/2020 0908   Nutritional Lab Results  Component Value Date   VD25OH 58.5 08/23/2023   VD25OH 55.2 12/29/2022   VD25OH 63.8 07/20/2022     Assessment and Plan Assessment & Plan Obesity Management is ongoing with a focus on dietary modifications and increased physical activity. She is following the category three eating plan about 50% of the time, incorporating more fruits, vegetables, and protein. She has been skipping meals and not hydrating adequately. She maintains her weight over the last five weeks despite increased activity and travel. She is using strategies such as portion control, mindful eating, and intermittent fasting to manage her weight. She is transitioning from Ozempic  to Zepbound  for weight management, with a plan to start at 0.25 mg and adjust based on tolerance. - Continue category three eating plan with emphasis on fruits, vegetables, and protein. - Encouraged adequate hydration. - Continue physical activity including floor stretches, walking, and spontaneous movement. - Start Zepbound  0.25 mg and monitor tolerance; adjust dose as needed. - Provided a copy of the meal plan and eat-out guide.  Type 2 diabetes mellitus Managed through diet, exercise, and weight loss efforts. She is actively working on these lifestyle modifications to manage her condition. - Continue dietary modifications and exercise regimen.  Obstructive sleep apnea Managed with Zepbound , which is also being used for weight management. She is transitioning from Ozempic  to Zepbound , with a plan to start at 0.25 mg and adjust based on tolerance. - Start Zepbound  0.25 mg and monitor tolerance; adjust dose as needed.  Vitamin D   deficiency Managed with prescription cholecalciferol  50,000 IU weekly. She requests a refill. - Refilled prescription for cholecalciferol  50,000 IU weekly.  Vitamin B12 deficiency Managed with prescription B12 1,000 mcg daily. She requests a refill. - Refilled prescription for B12 1,000 mcg daily.      Patients who are on anti-obesity medications are counseled on the importance of maintaining healthy lifestyle habits, including balanced nutrition, regular physical activity, and behavioral modifications,  Medication is an adjunct to, not a replacement for, lifestyle changes and that the long-term success and weight maintenance depend on continued adherence to these strategies.   Maylen was informed of the importance of frequent follow up visits to maximize her success with intensive lifestyle modifications for her obesity and obesity related health conditions as recommended by USPSTF and CMS guidelines  Louann Penton, MD

## 2024-04-05 ENCOUNTER — Other Ambulatory Visit (HOSPITAL_COMMUNITY): Payer: Self-pay

## 2024-04-05 ENCOUNTER — Ambulatory Visit (INDEPENDENT_AMBULATORY_CARE_PROVIDER_SITE_OTHER): Admitting: Family Medicine

## 2024-04-05 ENCOUNTER — Encounter: Payer: Self-pay | Admitting: Family Medicine

## 2024-04-05 VITALS — BP 130/80 | HR 99 | Temp 98.5°F | Resp 16 | Ht 68.0 in | Wt 240.6 lb

## 2024-04-05 DIAGNOSIS — B372 Candidiasis of skin and nail: Secondary | ICD-10-CM | POA: Diagnosis not present

## 2024-04-05 MED ORDER — NYSTATIN-TRIAMCINOLONE 100000-0.1 UNIT/GM-% EX CREA
1.0000 | TOPICAL_CREAM | Freq: Two times a day (BID) | CUTANEOUS | 1 refills | Status: DC | PRN
  Filled 2024-04-05: qty 15, 8d supply, fill #0
  Filled 2024-04-05: qty 45, 23d supply, fill #0
  Filled 2024-04-05: qty 30, 15d supply, fill #0

## 2024-04-05 MED ORDER — NYSTATIN 100000 UNIT/GM EX POWD
1.0000 | Freq: Three times a day (TID) | CUTANEOUS | 4 refills | Status: DC | PRN
  Filled 2024-04-05: qty 60, 20d supply, fill #0

## 2024-04-05 NOTE — Progress Notes (Unsigned)
 ACUTE VISIT Chief Complaint  Patient presents with   Acute Visit    Rash on chest    Discussed the use of AI scribe software for clinical note transcription with the patient, who gave verbal consent to proceed.  History of Present Illness Briana Ferguson is a 63 year old female with PMHx significant for DM II, OSA, CKD III, and vit D deficiency who presents with a rash on her chest.  She has had a very pruritic rash on her chest, specifically in the middle and under her breast, for about three weeks. The rash began as a minor itch and progressively worsened into a deep itch that is difficult to relieve. Initial treatment with Benadryl liquid and cream did not provide significant relief. The rash appears sporadically and has not been present before. Also reports pruritus on perineal area and lower abdominal skin folds.  Negative for sick contact, new exposures,insect bites, or new skin products.  She applied Monistat cream after a family member suggested it might be a yeast infection, similar to past vaginal yeast infections she had treated with Monistat. However, the Monistat did not result in any remarkable improvement. Currently, the rash has dried up and appears darkened where she has been scratching.  No fever or chills accompany the rash. The itching does not cause pain unless she scratches hard.  Review of Systems  Constitutional:  Negative for activity change, appetite change and unexpected weight change.  HENT:  Negative for mouth sores and sore throat.   Respiratory:  Negative for cough, shortness of breath and wheezing.   Gastrointestinal:  Negative for abdominal pain, nausea and vomiting.  Musculoskeletal:  Negative for myalgias.  Skin:  Negative for wound.  Neurological:  Negative for syncope, weakness and numbness.  See other pertinent positives and negatives in HPI.  Medications Ordered Prior to Encounter[1]  Past Medical History:  Diagnosis Date   Anemia     as a child   Anxiety    no per pt   Arthritis    Attention and concentration deficit 2011   had med trial Dr Stacia ? dx    Back pain    Bilateral carpal tunnel syndrome    Diabetes mellitus without complication (HCC)    Dyspnea    Environmental allergies    Cats   GERD (gastroesophageal reflux disease)    IBS (irritable bowel syndrome)    Joint pain    Lactose intolerance    Lower extremity edema    Multiple food allergies    Other fatigue    Prediabetes    Shortness of breath on exertion    Sleep apnea    wears CPAP   Swallowing difficulty    Tubular adenoma of colon    Vitamin D  deficiency    Allergies[2]  Social History   Socioeconomic History   Marital status: Married    Spouse name: Marieliz Strang   Number of children: Not on file   Years of education: Not on file   Highest education level: Bachelor's degree (e.g., BA, AB, BS)  Occupational History   Occupation: Electrical Engineer: [employer]  Tobacco Use   Smoking status: Never   Smokeless tobacco: Never  Vaping Use   Vaping status: Never Used  Substance and Sexual Activity   Alcohol use: No    Comment: none   Drug use: No   Sexual activity: Yes    Partners: Male  Other Topics Concern  Not on file  Social History Narrative   [employer] degree  May 2011.  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   4  Daughter husband and m in [employer]  Outside pet.      Has smoke detector and wears seat belts.  No firearms. Stored safely .Sees dentist regularly . No depression       CB x 3    Husband with vascectomy            Social Drivers of Health   Tobacco Use: Low Risk (04/05/2024)   Patient History    Smoking Tobacco Use: Never    Smokeless Tobacco Use: Never    Passive Exposure: Not on file  Financial Resource Strain: Low Risk (02/02/2024)   Overall Financial Resource Strain (CARDIA)     Difficulty of Paying Living Expenses: Not very hard  Food Insecurity: No Food Insecurity (02/02/2024)   Epic    Worried About Programme Researcher, Broadcasting/film/video in the Last Year: Never true    Ran Out of Food in the Last Year: Never true  Transportation Needs: No Transportation Needs (02/02/2024)   Epic    Lack of Transportation (Medical): No    Lack of Transportation (Non-Medical): No  Physical Activity: Inactive (02/02/2024)   Exercise Vital Sign    Days of Exercise per Week: 0 days    Minutes of Exercise per Session: Not on file  Stress: No Stress Concern Present (02/02/2024)   [employer] of Occupational Health - Occupational Stress Questionnaire    Feeling of Stress: Only a little  Social Connections: Socially Integrated (02/02/2024)   Social Connection and Isolation Panel    Frequency of Communication with Friends and Family: More than three times a week    Frequency of Social Gatherings with Friends and Family: More than three times a week    Attends Religious Services: More than 4 times per year    Active Member of Clubs or Organizations: Yes    Attends Banker Meetings: More than 4 times per year    Marital Status: Married  Depression (PHQ2-9): Low Risk (02/02/2024)   Depression (PHQ2-9)    PHQ-2 Score: 1  Alcohol Screen: Low Risk (12/28/2022)   Alcohol Screen    Last Alcohol Screening Score (AUDIT): 0  Housing: Low Risk (02/02/2024)   Epic    Unable to Pay for Housing in the Last Year: No    Number of Times Moved in the Last Year: 0    Homeless in the Last Year: No  Utilities: Not on file  Health Literacy: Not on file   Vitals:   04/05/24 1538  BP: 130/80  Pulse: 99  Resp: 16  Temp: 98.5 F (36.9 C)  SpO2: 98%   Body mass index is 36.58 kg/m.  Physical Exam Vitals and nursing note reviewed.  Constitutional:      General: She is not in acute distress.    Appearance: She is well-developed.  HENT:     Head: Normocephalic and atraumatic.      Mouth/Throat:     Mouth: Mucous membranes are moist.     Pharynx: Oropharynx is clear.  Eyes:     Conjunctiva/sclera: Conjunctivae normal.  Cardiovascular:     Rate and Rhythm: Normal rate and regular rhythm.     Heart sounds: No murmur heard. Pulmonary:  Effort: Pulmonary effort is normal. No respiratory distress.     Breath sounds: Normal breath sounds.  Lymphadenopathy:     Cervical: No cervical adenopathy.  Skin:    General: Skin is warm.     Findings: No rash.     Comments: Postinflammatory pigmentation changes on mid chest alone sternon and under breast. No erythema,induration,or pain.  Neurological:     General: No focal deficit present.     Mental Status: She is alert and oriented to person, place, and time.     Gait: Gait normal.  Psychiatric:        Mood and Affect: Mood and affect normal.   ASSESSMENT AND PLAN:  Cambryn was seen today for acute visit.  Diagnoses and all orders for this visit:  Intertriginous candidiasis -     nystatin -triamcinolone  (MYCOLOG II) cream; Apply 1 Application topically 2 (two) times daily as needed. -     nystatin  (MYCOSTATIN /NYSTOP ) powder; Apply topically 3 (three) times daily between meals as needed.  We discussed diagnosis,trigger factors, prognosis,and treatment options. It has improved. Keep area dry, were cotton bra, and use antibacterial soap. Nystatin -triamcinolone  small amount bid prn and Nystatin  powder 3 times per day as needed. Monitor for signs of infection, avoid scratching area. F/U as needed.  Return if symptoms worsen or fail to improve, for keep next appointment.  Metta Koranda G. Wanna Gully, MD  [facility]. [place] office.     [1]  Current Outpatient Medications on File Prior to Visit  Medication Sig Dispense Refill   Blood Glucose Monitoring Suppl DEVI 1 each by Does not apply route in the morning, at noon, and at bedtime. May substitute to any manufacturer covered by patient's insurance. 1 each 0    Cholecalciferol  1.25 MG (50000 UT) capsule Take 1 capsule (50,000 Units total) by mouth once a week. 4 capsule 0   cyanocobalamin  (VITAMIN B12) 1000 MCG tablet Take 1 tablet (1,000 mcg total) by mouth daily. 30 tablet 0   estradiol (ESTRACE) 0.01 % CREA vaginal cream Place vaginally.     fluticasone  (FLONASE ) 50 MCG/ACT nasal spray 2 sprays each nostril as needed 16 g 0   MAGNESIUM PO Take 2,600 mg by mouth as needed.     Multiple Vitamins-Minerals (CENTRUM SILVER 50+WOMEN PO) Take 1 tablet by mouth daily.     tirzepatide  (ZEPBOUND ) 2.5 MG/0.5ML injection vial Inject 2.5 mg into the skin once a week. 2 mL 0   buPROPion  (WELLBUTRIN  SR) 200 MG 12 hr tablet Take 1 tablet (200 mg total) by mouth in the morning. (Patient not taking: Reported on 04/05/2024) 30 tablet 0   No current facility-administered medications on file prior to visit.  [2]  Allergies Allergen Reactions   Sulfamethoxazole-Trimethoprim     REACTION: feels faint/per pt not sure if allergic to med. Was at young age. Pt states currently she is not allgeric to anything.

## 2024-04-05 NOTE — Patient Instructions (Addendum)
 A few things to remember from today's visit:  Intertriginous candidiasis - Plan: nystatin-triamcinolone (MYCOLOG II) cream, nystatin (MYCOSTATIN/NYSTOP) powder  Antibacterial soap. Avoid scratching. Cotton underwear.  If you need refills for medications you take chronically, please call your pharmacy. Do not use My Chart to request refills or for acute issues that need immediate attention. If you send a my chart message, it may take a few days to be addressed, specially if I am not in the office.  Please be sure medication list is accurate. If a new problem present, please set up appointment sooner than planned today.

## 2024-04-08 ENCOUNTER — Other Ambulatory Visit: Payer: Self-pay

## 2024-04-15 ENCOUNTER — Telehealth (INDEPENDENT_AMBULATORY_CARE_PROVIDER_SITE_OTHER): Payer: Self-pay | Admitting: Family Medicine

## 2024-04-15 NOTE — Telephone Encounter (Signed)
 Pt called and would like a call back regarding her zepbound  prescription.

## 2024-04-16 ENCOUNTER — Other Ambulatory Visit (INDEPENDENT_AMBULATORY_CARE_PROVIDER_SITE_OTHER): Payer: Self-pay | Admitting: Family Medicine

## 2024-04-16 NOTE — Telephone Encounter (Signed)
 Spoke w pt, needs a refill of Zepbound  04/22/24 for FSA, wants to see Dr Verdon asap for dose increase, please call pt if have a cancellation or how to proceed with refill

## 2024-04-16 NOTE — Telephone Encounter (Signed)
 Patient is circling back and would like a call before we New Years because her FSA card expires at the end of the year.

## 2024-04-17 NOTE — Telephone Encounter (Signed)
 Added pt to wait list

## 2024-04-29 ENCOUNTER — Ambulatory Visit (INDEPENDENT_AMBULATORY_CARE_PROVIDER_SITE_OTHER): Admitting: Family Medicine

## 2024-04-29 ENCOUNTER — Encounter (INDEPENDENT_AMBULATORY_CARE_PROVIDER_SITE_OTHER): Payer: Self-pay | Admitting: Family Medicine

## 2024-04-29 VITALS — BP 104/66 | HR 67 | Temp 98.0°F | Ht 68.0 in | Wt 236.0 lb

## 2024-04-29 DIAGNOSIS — E538 Deficiency of other specified B group vitamins: Secondary | ICD-10-CM

## 2024-04-29 DIAGNOSIS — E669 Obesity, unspecified: Secondary | ICD-10-CM | POA: Diagnosis not present

## 2024-04-29 DIAGNOSIS — F3289 Other specified depressive episodes: Secondary | ICD-10-CM

## 2024-04-29 DIAGNOSIS — G4733 Obstructive sleep apnea (adult) (pediatric): Secondary | ICD-10-CM

## 2024-04-29 DIAGNOSIS — E559 Vitamin D deficiency, unspecified: Secondary | ICD-10-CM | POA: Diagnosis not present

## 2024-04-29 DIAGNOSIS — Z6835 Body mass index (BMI) 35.0-35.9, adult: Secondary | ICD-10-CM | POA: Diagnosis not present

## 2024-04-29 MED ORDER — CHOLECALCIFEROL 1.25 MG (50000 UT) PO CAPS: 50000.0000 [IU] | ORAL_CAPSULE | ORAL | 0 refills | Status: DC

## 2024-04-29 MED ORDER — VITAMIN B-12 1000 MCG PO TABS: 1000.0000 ug | ORAL_TABLET | Freq: Every day | ORAL | 0 refills | Status: AC

## 2024-04-29 MED ORDER — ZEPBOUND 5 MG/0.5ML ~~LOC~~ SOAJ: 5.0000 mg | SUBCUTANEOUS | 0 refills | Status: DC

## 2024-04-29 NOTE — Progress Notes (Signed)
 "  Office: [phone]  /  Fax: [phone]  WEIGHT SUMMARY AND BIOMETRICS  Anthropometric Measurements Height: 5' 8 (1.727 m) Weight: 236 lb (107 kg) BMI (Calculated): 35.89 Weight at Last Visit: 238 lb Weight Lost Since Last Visit: 2 lb Weight Gained Since Last Visit: 0 Starting Weight: 261 lb Total Weight Loss (lbs): 25 lb (11.3 kg) Peak Weight: 261 lb   Body Composition  Body Fat %: 42.8 % Fat Mass (lbs): 101 lbs Muscle Mass (lbs): 128.2 lbs Total Body Water (lbs): 93.4 lbs Visceral Fat Rating : 13   Other Clinical Data Fasting: no Labs: no Today's Visit #: 33 Starting Date: 05/07/19    Chief Complaint: OBESITY    History of Present Illness Briana Ferguson is a 64 year old female with obesity who presents for a follow-up on her obesity treatment and progress.  She has been following the category three eating plan about fifty percent of the time over the past month, focusing on increasing protein intake and consuming more fruits and vegetables. She engages in physical activity for fifteen minutes two days a week and has achieved a weight loss of two pounds over the last month, including during the holiday season.  She has a history of obstructive sleep apnea and uses CPAP therapy. She is currently on Zepbound , initially at 2.5 mg, and reports subtle benefits such as reduced hunger without any side effects.  She is being treated for vitamin D  deficiency with prescription ergocalciferol  50,000 IU weekly and requests a refill. She is also on prescription B12 1,000 mcg daily for B12 deficiency and requests a refill.  She experiences emotional eating behaviors and has been treated with Wellbutrin  SR, which provided limited benefit. She has stopped taking it, feeling she no longer needs it.  She is working on meal planning and preparation, ensuring she has healthy food options at home to avoid unhealthy snacks. She has noticed a decrease in cravings for salty snacks  since starting Ozempic  and is mindful of distinguishing between hunger and other triggers for eating, such as tiredness or thirst.      PHYSICAL EXAM:  Blood pressure 104/66, pulse 67, temperature 98 F (36.7 C), height 5' 8 (1.727 m), weight 236 lb (107 kg), last menstrual period 04/03/2011, SpO2 97%. Body mass index is 35.88 kg/m.  DIAGNOSTIC DATA REVIEWED:  BMET    Component Value Date/Time   NA 139 08/23/2023 1526   K 4.0 08/23/2023 1526   CL 101 08/23/2023 1526   CO2 24 08/23/2023 1526   GLUCOSE 72 08/23/2023 1526   GLUCOSE 107 (H) 10/16/2019 1133   BUN 12 08/23/2023 1526   CREATININE 0.99 08/23/2023 1526   CALCIUM 9.2 08/23/2023 1526   GFRNONAA 54 (L) 06/16/2020 0908   GFRAA 62 06/16/2020 0908   Lab Results  Component Value Date   HGBA1C 5.8 (H) 08/23/2023   HGBA1C 6.5 03/19/2019   Lab Results  Component Value Date   INSULIN  7.3 08/23/2023   INSULIN  18.9 05/07/2019   Lab Results  Component Value Date   TSH 2.190 06/16/2020   CBC    Component Value Date/Time   WBC 4.4 06/16/2020 0908   WBC 5.4 10/16/2019 1133   RBC 4.62 06/16/2020 0908   RBC 4.87 10/16/2019 1133   HGB 13.1 06/16/2020 0908   HCT 40.1 06/16/2020 0908   PLT 294 06/16/2020 0908   MCV 87 06/16/2020 0908   MCH 28.4 06/16/2020 0908   MCH 27.7 10/16/2019 1133   MCHC 32.7  06/16/2020 0908   MCHC 31.7 10/16/2019 1133   RDW 13.1 06/16/2020 0908   Iron Studies No results found for: IRON, TIBC, FERRITIN, IRONPCTSAT Lipid Panel     Component Value Date/Time   CHOL 180 12/29/2022 0754   TRIG 80 12/29/2022 0754   HDL 72 12/29/2022 0754   CHOLHDL 3 03/19/2019 1117   VLDL 19.4 03/19/2019 1117   LDLCALC 93 12/29/2022 0754   Hepatic Function Panel     Component Value Date/Time   PROT 7.2 08/23/2023 1526   ALBUMIN 4.4 08/23/2023 1526   AST 14 08/23/2023 1526   ALT 13 08/23/2023 1526   ALKPHOS 84 08/23/2023 1526   BILITOT 0.5 08/23/2023 1526   BILIDIR 0.1 03/19/2019 1117       Component Value Date/Time   TSH 2.190 06/16/2020 0908   Nutritional Lab Results  Component Value Date   VD25OH 58.5 08/23/2023   VD25OH 55.2 12/29/2022   VD25OH 63.8 07/20/2022     Assessment and Plan Assessment & Plan Generalized obesity and OSA Managed with lifestyle modifications and pharmacotherapy. She has lost two pounds over the last month, including during the holidays. She follows the category three eating plan about 50% of the time, exercises for 15 minutes two days a week, and has been prescribed Zepbound  2.5 mg, which she reports subtle benefits without side effects. She has stopped taking Zepbound  but is willing to try increasing the dose to 5 mg for better efficacy. Emotional eating behaviors are present, previously treated with Wellbutrin  SR with limited benefit. - Increased Zepbound  to 5 mg and instructed to monitor for side effects such as queasiness or nausea. - Continue category three eating plan and exercise regimen. - Encouraged meal planning and preparation to support dietary goals. - Scheduled follow-up appointment in February.  Vitamin D  deficiency Managed with prescription ergocalciferol  50,000 IU weekly. - Refilled prescription for ergocalciferol  50,000 IU weekly.  Vitamin B12 deficiency Managed with prescription B12 1,000 mcg daily. - Refilled prescription for B12 1,000 mcg daily.      Patients who are on anti-obesity medications are counseled on the importance of maintaining healthy lifestyle habits, including balanced nutrition, regular physical activity, and behavioral modifications,  Medication is an adjunct to, not a replacement for, lifestyle changes and that the long-term success and weight maintenance depend on continued adherence to these strategies.   Shenika was informed of the importance of frequent follow up visits to maximize her success with intensive lifestyle modifications for her obesity and obesity related health conditions as  recommended by USPSTF and CMS guidelines Louann Penton, MD   "

## 2024-05-13 ENCOUNTER — Ambulatory Visit (INDEPENDENT_AMBULATORY_CARE_PROVIDER_SITE_OTHER): Admitting: Family Medicine

## 2024-05-26 ENCOUNTER — Other Ambulatory Visit (INDEPENDENT_AMBULATORY_CARE_PROVIDER_SITE_OTHER): Payer: Self-pay | Admitting: Family Medicine

## 2024-05-26 DIAGNOSIS — E538 Deficiency of other specified B group vitamins: Secondary | ICD-10-CM

## 2024-05-27 ENCOUNTER — Encounter (INDEPENDENT_AMBULATORY_CARE_PROVIDER_SITE_OTHER): Payer: Self-pay | Admitting: Family Medicine

## 2024-05-27 ENCOUNTER — Other Ambulatory Visit (INDEPENDENT_AMBULATORY_CARE_PROVIDER_SITE_OTHER): Payer: Self-pay | Admitting: Family Medicine

## 2024-05-27 ENCOUNTER — Telehealth (INDEPENDENT_AMBULATORY_CARE_PROVIDER_SITE_OTHER): Admitting: Family Medicine

## 2024-05-27 VITALS — Ht 68.0 in | Wt 236.0 lb

## 2024-05-27 DIAGNOSIS — E669 Obesity, unspecified: Secondary | ICD-10-CM | POA: Diagnosis not present

## 2024-05-27 DIAGNOSIS — Z6835 Body mass index (BMI) 35.0-35.9, adult: Secondary | ICD-10-CM | POA: Diagnosis not present

## 2024-05-27 DIAGNOSIS — E559 Vitamin D deficiency, unspecified: Secondary | ICD-10-CM

## 2024-05-27 MED ORDER — CHOLECALCIFEROL 1.25 MG (50000 UT) PO CAPS: 50000.0000 [IU] | ORAL_CAPSULE | ORAL | 0 refills | Status: AC

## 2024-05-27 MED ORDER — ZEPBOUND 5 MG/0.5ML ~~LOC~~ SOAJ: 5.0000 mg | SUBCUTANEOUS | 0 refills | Status: DC

## 2024-05-29 ENCOUNTER — Encounter (INDEPENDENT_AMBULATORY_CARE_PROVIDER_SITE_OTHER): Payer: Self-pay | Admitting: Family Medicine

## 2024-05-29 MED ORDER — ZEPBOUND 5 MG/0.5ML ~~LOC~~ SOAJ: 5.0000 mg | SUBCUTANEOUS | 0 refills | Status: DC

## 2024-05-30 MED ORDER — TIRZEPATIDE-WEIGHT MANAGEMENT 5 MG/0.5ML ~~LOC~~ SOLN: 5.0000 mg | SUBCUTANEOUS | 0 refills | Status: AC

## 2024-05-30 NOTE — Addendum Note (Signed)
 Addended by: LAFE BAKER CROME on: 05/30/2024 07:10 AM   Modules accepted: Orders

## 2024-06-10 ENCOUNTER — Ambulatory Visit (INDEPENDENT_AMBULATORY_CARE_PROVIDER_SITE_OTHER): Admitting: Family Medicine

## 2024-06-24 ENCOUNTER — Ambulatory Visit (INDEPENDENT_AMBULATORY_CARE_PROVIDER_SITE_OTHER): Admitting: Family Medicine

## 2024-07-02 ENCOUNTER — Ambulatory Visit: Admitting: Pulmonary Disease
